# Patient Record
Sex: Male | Born: 1935 | Race: White | Hispanic: No | Marital: Married | Smoking: Former smoker
Health system: Southern US, Community
[De-identification: ages and names within clinical notes are randomized; demographics above are authoritative.]

## PROBLEM LIST (undated history)

## (undated) DIAGNOSIS — K469 Unspecified abdominal hernia without obstruction or gangrene: Secondary | ICD-10-CM

## (undated) DIAGNOSIS — N400 Enlarged prostate without lower urinary tract symptoms: Secondary | ICD-10-CM

## (undated) DIAGNOSIS — R600 Localized edema: Secondary | ICD-10-CM

## (undated) DIAGNOSIS — S72009A Fracture of unspecified part of neck of unspecified femur, initial encounter for closed fracture: Secondary | ICD-10-CM

## (undated) DIAGNOSIS — E7800 Pure hypercholesterolemia, unspecified: Secondary | ICD-10-CM

## (undated) DIAGNOSIS — C439 Malignant melanoma of skin, unspecified: Secondary | ICD-10-CM

## (undated) DIAGNOSIS — I10 Essential (primary) hypertension: Secondary | ICD-10-CM

## (undated) DIAGNOSIS — IMO0002 Reserved for concepts with insufficient information to code with codable children: Secondary | ICD-10-CM

## (undated) DIAGNOSIS — M109 Gout, unspecified: Secondary | ICD-10-CM

## (undated) DIAGNOSIS — D696 Thrombocytopenia, unspecified: Secondary | ICD-10-CM

## (undated) DIAGNOSIS — H409 Unspecified glaucoma: Secondary | ICD-10-CM

## (undated) DIAGNOSIS — I4891 Unspecified atrial fibrillation: Secondary | ICD-10-CM

## (undated) HISTORY — PX: APPENDECTOMY: SHX54

## (undated) HISTORY — DX: Gout, unspecified: M10.9

## (undated) HISTORY — PX: TONSILLECTOMY: SUR1361

## (undated) HISTORY — PX: EYE SURGERY: SHX253

## (undated) HISTORY — DX: Thrombocytopenia, unspecified: D69.6

## (undated) HISTORY — DX: Essential (primary) hypertension: I10

## (undated) HISTORY — PX: JOINT REPLACEMENT: SHX530

## (undated) HISTORY — DX: Localized edema: R60.0

## (undated) HISTORY — DX: Benign prostatic hyperplasia without lower urinary tract symptoms: N40.0

## (undated) HISTORY — DX: Pure hypercholesterolemia, unspecified: E78.00

## (undated) HISTORY — PX: HERNIA REPAIR: SHX51

---

## 2004-06-28 ENCOUNTER — Encounter: Admission: RE | Admit: 2004-06-28 | Discharge: 2004-06-28 | Payer: Self-pay | Admitting: Orthopedic Surgery

## 2004-07-03 HISTORY — PX: LUMBAR DISC SURGERY: SHX700

## 2004-07-21 ENCOUNTER — Inpatient Hospital Stay (HOSPITAL_COMMUNITY): Admission: RE | Admit: 2004-07-21 | Discharge: 2004-07-22 | Payer: Self-pay | Admitting: Neurosurgery

## 2006-11-01 HISTORY — PX: REPLACEMENT TOTAL KNEE: SUR1224

## 2006-11-05 ENCOUNTER — Inpatient Hospital Stay (HOSPITAL_COMMUNITY): Admission: RE | Admit: 2006-11-05 | Discharge: 2006-11-10 | Payer: Self-pay | Admitting: Orthopedic Surgery

## 2007-04-03 ENCOUNTER — Ambulatory Visit (HOSPITAL_COMMUNITY): Admission: RE | Admit: 2007-04-03 | Discharge: 2007-04-03 | Payer: Self-pay | Admitting: General Surgery

## 2008-04-02 HISTORY — PX: CATARACT EXTRACTION: SUR2

## 2010-11-15 NOTE — Consult Note (Signed)
Isaac Villa, PINSKY NO.:  [id number]   MEDICAL RECORD NO.:  [id number]          PATIENT TYPE:  INP   LOCATION:  1621                         FACILITY:  [facility]   PHYSICIAN:  Lyn Records, M.D.   DATE OF BIRTH:  [birth year]   DATE OF CONSULTATION:  11/07/2006  DATE OF DISCHARGE:                                 CONSULTATION   REASON FOR CONSULTATION:  Unresponsiveness, history of atrial  fibrillation.   CONCLUSIONS:  1. Loss of consciousness witnessed by wife occurring on two occasions      this afternoon associated with twitching and rigor. Rule out      transient severe bradycardia resulting in Stokes-Adams syncopal      attack.  Rule out seizure.  Rule out other.  2. History of chronic atrial fibrillation with current moderate rate      control.  3. Status post left knee replacement 11/05/2006.  4. Chronic anticoagulation therapy.  5. History of melanoma.  6. Hypertension.  7. Benign prostatic hypertrophy with urinary retention.   RECOMMENDATIONS:  1. Transfer to monitored bed  2. Check digoxin level.  3. Continue current medical regimen as listed.  4. Continue cardiac rehab.  5. Serial cardiac enzymes, rule out myocardial infarction.  6. EKG in a.m.  7. Neurology consultation.   COMMENTS:  The patient is 61.  He is confused and we are consulted by  Dr. Lequita Halt because of two the distinct episodes today of diminished  consciousness/syncope associated with twitching/jerking.  Both episodes  were witnessed by the patient's wife.  He was not arousable to verbal or  physical stimuli.  The episodes were short lived.  They were not  witnessed by a nurse.  On each occasion by the time the nurse got there  the episode was over and the patient had regained consciousness.  There  was no antecedent or post event chest discomfort, palpitations, dyspnea,  headache or other complaints.   CURRENT MEDICAL REGIMEN:  Allopurinol, benazepril, digoxin, diltiazem,  Colace, Proscar, hydrochlorothiazide, KCl,  Zocor, Flomax and warfarin.   FAMILY HISTORY:  Noncontributory.   SOCIAL HISTORY:  Noncontributory.   PHYSICAL EXAMINATION:  GENERAL:  On exam the patient is in no acute  distress.  VITAL SIGNS:  Blood pressure 134/74, heart rate is 98, O2 saturation is  95% on 2 liters.  NECK:  No carotid bruits heard.  Neck veins are flat.  HEART:  The cardiac exam reveals a 1/6 systolic murmur and a diastolic  murmur.  Rhythm is irregularly irregular.  ABDOMEN:  Soft.  EXTREMITIES: Reveal no edema.  NEURO: Exam is unremarkable.   Hemoglobin is 8.5.  BUN and creatinine are 14 and 0.91 potassium is 3.5.  INR is 1.8.  CK-MB 2.4, troponin-I 0.03.  Chest x-ray:  On 05/07 no acute abnormality.  EKG reveals chronic atrial  fibrillation with interventricular conduction delay that is nonspecific  and left axis deviation.  This finding is unchanged from prior EKG in  Dr. Norris Cross office on 10/17/2006.   DISCUSSION:  I am not sure what happened to the patient.  He could have  had transient hypoxemia secondary to sedation from medications.  He  could have had a transient arrhythmia.  He could have had a seizure.  We  need to move him to a telemetry bed.  We need to consider neurology  consultation.  Dr. Mayford Knife will follow.      Lyn Records, M.D.  Electronically Signed     HWS/MEDQ  D:  11/07/2006  T:  11/08/2006  Job:  [id number]   cc:   Armanda Magic, M.D.  Fax: [phone]   Jethro Bastos, M.D.  Fax: [phone]   Ollen Gross, M.D.  Fax: [phone]

## 2010-11-15 NOTE — H&P (Signed)
NAMESTEPHANE, Isaac Villa              ACCOUNT NO.:  [id number]   MEDICAL RECORD NO.:  [id number]          PATIENT TYPE:  INP   LOCATION:  1621                         FACILITY:  [facility]   PHYSICIAN:  Ollen Gross, M.D.    DATE OF BIRTH:  [birth year]   DATE OF ADMISSION:  11/05/2006  DATE OF DISCHARGE:                              HISTORY & PHYSICAL   DATE OF OFFICE VISIT HISTORY AND PHYSICAL:  October 25, 2006   CHIEF COMPLAINT:  Left knee pain.   HISTORY OF PRESENT ILLNESS:  The patient is a 77 year old male who has  been seen by Dr. Lequita Halt in second opinion for ongoing knee pain.  He  has bilateral knee pain and the left knee seems to be more problematic  and symptomatic than the right.  It has been ongoing for quite some time  now.  He has 2 homes, 1 here in [place] and also resides in [place]  for part of the year.  He was seen by his orthopedic surgeon down in  [place] and is known to have end-stage arthritis in the knees.  He  underwent recently an aspiration and injection; unfortunately, the knee  has continued to get worse.  He was seen by Dr. Lequita Halt and found to  have significant end-stage arthritis of the left knee with  patellofemoral bone-on-bone.  He has had apparently recurring bloody  effusions; the most recent aspiration in the office did show about 80 mL  of old blood consistent with hemarthrosis.  Due to recurring effusions  and also significant arthritis, it was felt he would benefit undergoing  knee replacement.  Risks and benefits have been discussed.  He now  elects to proceed with surgery.   ALLERGIES:  PENICILLIN, LOVENOX and COLCHICINE.   CURRENT MEDICATIONS:  Coumadin, allopurinol, potassium, Lipitor,  hydrochlorothiazide, Lotensin/benazepril, Cardizem/diltiazem, Proscar,  Flomax.   PAST MEDICAL HISTORY:  1. Chronic atrial fibrillation.  2. Hypertension.  3. History of BPH.  4. Gout.  5. Dyslipidemia.  6. History of melanoma.   PAST SURGICAL  HISTORY:  1. Appendectomy.  2. Hernia repair x3.  3. Herniated disk surgery.   FAMILY HISTORY:  Mother deceased at age 20 with a history of aneurysm.  Father deceased at age 79 with a history of MI.  He had an uncle with a  stroke.  Stroke and aneurysms run in the family.   SOCIAL HISTORY:  Married and has 4 children.  Cigar smoker.  Social  intake of alcohol.   REVIEW OF SYSTEMS:  GENERAL:  No fevers, chills or night sweats.  NEUROLOGIC:  No seizures, syncope or paralysis.  RESPIRATORY:  No  shortness breath, productive cough or hemoptysis.  CARDIOVASCULAR:  No  chest pain, angina or orthopnea.  GI:  No nausea, vomiting, diarrhea or  constipation.  GU:  No dysuria, hematuria or discharge.  MUSCULOSKELETAL:  Left knee.   PHYSICAL EXAMINATION:  VITAL SIGNS:  Pulse 56, respirations 12, blood  pressure 112/58.  GENERAL:  A 77 year old male, well-nourished, well-developed, tall  frame, in no acute distress.  He is alert, oriented and cooperative,  somewhat  mildly anxious at time of exam.  He is accompanied by his wife.  HEENT:  Normocephalic, atraumatic.  Pupils are equal, round and  reactive.  Oropharynx clear.  EOMs intact.  NECK:  Supple.  CHEST:  Clear.  HEART:  He does have an irregular rhythm.  S1 and S2 are noted.  No  rubs, thrills or palpitations.  ABDOMEN:  Soft and nontender.  Bowel sounds are present.  RECTAL, BREASTS AND GENITALIA:  Not done and not pertinent to present  illness.  EXTREMITIES:  Left knee:  He does have a moderate effusion.  Range of  motion 10-90.  No instability.  Tender more lateral than medial.   IMPRESSION:  1. Osteoarthritis, end stage, left knee.  2. Degenerative arthritis, right knee.  3. History of melanoma.  4. Hypertension.  5. History of gout.  6. Dyslipidemia.  7. Benign prostatic hypertrophy.  8. Chronic atrial fibrillation.   PLAN:  The patient will be admitted to [facility] to undergo a  left total knee replacement  arthroplasty.  Surgery will be performed by  Dr. Ollen Gross.  His cardiologist, Dr. Armanda Magic, will be notified  of the room number on admission and will be consulted if needed for any  assistance during the hospital course.      Alexzandrew L. Perkins, P.A.C.      Ollen Gross, M.D.  Electronically Signed    ALP/MEDQ  D:  11/06/2006  T:  11/06/2006  Job:  [id number]   cc:   Armanda Magic, M.D.  Fax: [phone]   Jethro Bastos, M.D.  Fax: [phone]   Maretta Bees. Vonita Moss, M.D.  Fax: [phone]

## 2010-11-15 NOTE — Op Note (Signed)
NAMEANASTASIO, Villa              ACCOUNT NO.:  [id number]   MEDICAL RECORD NO.:  [id number]          PATIENT TYPE:  INP   LOCATION:  0010                         FACILITY:  [facility]   PHYSICIAN:  Ollen Gross, M.D.    DATE OF BIRTH:  1925-06-06   DATE OF PROCEDURE:  11/05/2006  DATE OF DISCHARGE:                               OPERATIVE REPORT   PREOPERATIVE DIAGNOSIS:  Osteoarthritis, left knee.   POSTOPERATIVE DIAGNOSIS:  Osteoarthritis, left knee.   PROCEDURE:  Left total knee arthroplasty.   SURGEON:  Ollen Gross, M.D.   ASSISTANT:  Avel Peace PA-C.   ANESTHESIA:  Spinal.   ESTIMATED BLOOD LOSS:  Minimal.   DRAINS:  None.   TOURNIQUET TIME:  44 minutes at 300 mmHg.   COMPLICATIONS:  None.  Stable to recovery.   BRIEF CLINICAL NOTE:  [name] is a 77 year old male who has severe  endstage arthritis of the left knee and has been getting recurrent  hemarthroses.  He has had injections and extensive nonoperative  management, but has had progressively worsening pain and dysfunction.  He presents for total knee arthroplasty.   PROCEDURE IN DETAIL:  After successful administration of spinal  anesthetic, a tourniquet is placed high on the left thigh and left lower  extremity prepped and draped in the usual sterile fashion.  Extremity  was wrapped in Esmarch, knee flexed, tourniquet inflated to 300 mmHg.  Midline incision made with a 10 blade through subcutaneous tissue to the  level of the extensor mechanism.  Fresh blade is used make a medial  parapatellar arthrotomy.  Soft tissue over the proximal medial tibia is  subperiosteally elevated to the joint line with the knife into the  semimembranosus bursa with a Cobb elevator.  Soft tissue laterally is  elevated with attention being paid to avoid patellar tendon on tibial  tubercle.  Patella was subluxed laterally and knee flexed 90 degrees,  ACL and PCL removed.  He had a lot of hemosiderin stained synovium and a  large hematoma in the joint, all of which is removed.  A drill was used  create a starting hole in distal femur and canal is thoroughly  irrigated.  A 5 degree left valgus alignment guide is placed and  referencing off the posterior condyles rotations marked on the block  pinned to remove 10 mm off the distal femur.  Distal femoral resection  is made with an oscillating saw.  Sizing blocks placed, size 5 is most  appropriate.  Rotation is marked off the epicondylar axis.  Size 5  cutting block is placed and the anterior-posterior chamfer cuts made.   Tibia subluxed forward and the menisci removed.  Extramedullary tibial  alignment guide is placed referencing proximally at the medial aspect of  the tibial tubercle and distally along the second metatarsal axis and  tibial crest.  Blocks pinned to remove 10 mm off the non deficient  lateral side.  Tibial resection is made with an oscillating saw.  Size 5  is the most appropriate tibial component and the proximal tibia is  prepared with the modular drill and  keel punch for a size 5.  Femoral  preparation is completed with the intercondylar cut.   Size 5 mobile bearing tibial trial, size 5 posterior stabilized femoral  trial and 10 mm posterior stabilized rotating platform insert trial are  placed with the 10 full extension is achieved with excellent varus and  valgus balance throughout full range of motion.  Patella is everted,  thickness measured to 26 mm.  Freehand resection is taken to 14 mm, 38  template is placed, lug holes were drilled, trial patella is placed and  it tracks normally.  Osteophytes removed off the posterior femur with  the trial in place.  All trials removed and the cut bone surfaces are  prepared with pulsatile lavage.  Cement is mixed and once ready for  implantation a size five 5 bearing tibial tray, size 5 posterior  stabilized femur and 38 patella are cemented in place and the patella is  held with the clamp.   Trial 10-mm insert is placed, knee held in full  extension and all extruded cement removed.  Once the cement is fully  hardened, then the permanent 10 mm posterior stabilized rotating  platform insert is placed into the tibial tray.  The wound is copiously  irrigated with saline solution.  FloSeal is placed on the posterior  capsule flap the suprapatellar area and medial and lateral gutters.  Tourniquet is released with total time of 44 minutes.  Moist sponge is  held and after 2-3 minutes  the joint is inspected, there is minimal  bleeding.  The joint is then again irrigated and the extensor mechanism  closed with interrupted #1 PDS.  Flexion against gravity is 135 degrees.  Subcu is closed with interrupted 2-0 Vicryl and subcuticular running 4-0  Monocryl.  Incision is cleaned and dried and Steri-Strips and a bulky  sterile dressing applied.  He is placed into a knee immobilizer,  awakened and transferred to recovery in stable condition.      Ollen Gross, M.D.  Electronically Signed     FA/MEDQ  D:  11/05/2006  T:  11/06/2006  Job:  [id number]

## 2010-11-15 NOTE — Op Note (Signed)
NAMEJUNAH, Isaac Villa              ACCOUNT NO.:  [id number]   MEDICAL RECORD NO.:  [id number]          PATIENT TYPE:  AMB   LOCATION:  SDS                          FACILITY:  [facility]   PHYSICIAN:  Adolph Pollack, M.D.DATE OF BIRTH:  Jul 02, 1925   DATE OF PROCEDURE:  04/03/2007  DATE OF DISCHARGE:                               OPERATIVE REPORT   PREOPERATIVE DIAGNOSIS:  Left inguinal hernia.   POSTOPERATIVE DIAGNOSIS:  Direct left inguinal hernia.   PROCEDURE:  Left inguinal repair with mesh.   SURGEON:  Adolph Pollack, M.D.   ANESTHESIA:  General plus Marcaine for local.   INDICATIONS:  This 77 year old male had a recent knee replacement.  He  had rehabilitation after that and noticed a bulge in the left groin  which was bothersome to him.  He has a left inguinal hernia that is  moderate to large sized and now presents for repair.  We have discussed  the procedure, risks and aftercare preoperatively.   TECHNIQUE:  He was seen in the holding area and the left groin marked  with my initials.  He was then brought to the operating room, placed  supine on the operating table and general anesthetic was administered.  The hair in the left groin area was clipped, and the area was sterilely  prepped and draped.  Marcaine solution was infiltrated superficially and  deep into the left groin.  Left groin incision was made through the  skin, subcutaneous tissue and Scarpa's fascia until the external oblique  aponeurosis was exposed.  Local anesthetic was then infiltrated deep to  the external oblique aponeurosis.  An incision was made in the external  oblique aponeurosis through the external ring medially on toward the  anterior superior iliac spine laterally.  Using blunt dissection, the  shelving edge of the inguinal ligament was exposed inferiorly and the  internal oblique aponeurosis exposed superiorly.  The ilioinguinal nerve  was identified and was retracted inferior to the plane  of dissection.   The spermatic cord was isolated.  An indirect hernia sac and contents  were adherent to the spermatic cord, and these were dissected free and  reduced.  I then closed the attenuated transversalis fascia with  interrupted 2-0 Vicryl sutures.   Spermatic cord was examined, and no indirect sac was noted.   A piece of 3 x 6-inch polypropylene mesh was then brought onto the field  and anchored 2-cm medial to the pubic tubercle with 2-0 Prolene suture.  The inferior aspect of the mesh was then anchored to the shelving edge  of the inguinal ligament with a running 2-0 Prolene suture to a level 1-  to 2-cm lateral to the internal ring.  A slit was cut in the mesh, and  the two tails were wrapped around spermatic cord.  The superior aspect  of mesh was anchored to the internal oblique muscle and aponeurosis with  interrupted 2-0 Vicryl sutures.  The two tails were then crossed,  creating a new internal ring, and these were anchored to the shelving  edge of the inguinal ligament with a 2-0 Prolene  suture.  The tip of the  hemostat could be placed through a new aperture.   At this point, hemostasis was adequate.  The lateral aspect of mesh was  then tucked deep to the external oblique aponeurosis which was closed  with a running 3-0 Vicryl suture.  The Scarpa's fascia was closed with 2-  0 Vicryl  running suture.  The skin was closed with a 4-0 Monocryl subcuticular  stitch, followed by Steri-Strips and sterile dressings.  Needle, sponge,  and instrument counts were reported to be correct.   He tolerated the procedure well without apparent complications and was  taken to recovery in satisfactory condition.      Adolph Pollack, M.D.  Electronically Signed     TJR/MEDQ  D:  04/03/2007  T:  04/03/2007  Job:  [id number]   cc:   Artist Pais Mina Marble, M.D.  Jethro Bastos, M.D.  Armanda Magic, M.D.

## 2010-11-18 NOTE — Op Note (Signed)
Isaac Villa, Isaac Villa              ACCOUNT NO.:  [id number]   MEDICAL RECORD NO.:  [id number]          PATIENT TYPE:  INP   LOCATION:  2899                         FACILITY:  [facility]   PHYSICIAN:  Reinaldo Meeker, M.D. DATE OF BIRTH:  Mar 03, 1925   DATE OF PROCEDURE:  07/21/2004  DATE OF DISCHARGE:                                 OPERATIVE REPORT   PREOPERATIVE DIAGNOSIS:  Herniated disk, L4-L5, right with superior  fragment.   POSTOPERATIVE DIAGNOSIS:  Herniated disk, L4-L5, right with superior  fragment.   PROCEDURE:  Right L4-L5 intralaminar laminotomy for excision of herniated  disk and decompression of right L4 and L5 nerve roots.   SURGEON:  Reinaldo Meeker, M.D.   ASSISTANT:  Marikay Alar, M.D.   PROCEDURE IN DETAIL:  After being placed in the prone position, the  patient's back was prepped and draped in the usual sterile fashion.  A  localizing x-ray was taken prior to the incision to identify the appropriate  level.  A midline incision was made above the spinous processes of L4 and  L5.  Using the Bovie cutting current, the incision was carried down to the  spinous processes.  Subperiosteal dissection was then carried out along the  right side of the spinous processes and laminae and a self-retaining  retractor was placed for exposure.  A second x-ray showed approach at the  appropriate level.  Using the high speed drill, the inferior three quarters  of the L4 lamina and medial one-third of the facet joint were removed.  The  drill was then used to the superior edge of the L5 lamina.  Dissection was  carried up superiorly with bony removal until the L4 and L5 nerve roots were  both identified.  The L4-L5 disk was then identified and found to be  herniated.  After coagulating on the annulus, the annulus was incised with a  15 blade.  Using pituitary rongeurs and curettes, the disk space was cleaned  out and at the same time great care was taken to avoid injury to neural  elements which was successfully done.  Inspection was then turned superiorly  to decompress the L4 nerve root.  Numerous fragments of disk material were  found to be wedged beneath the L4 nerve root as it went around the pedicle  at L4, these were decompressed and removed which gave excellent  decompression to the L4 nerve root.  One final large piece was found beneath  the nerve root as it entered the foramen.  At this point, inspection was  carried out in all directions for any evidence of residual compression and  none could be identified.  The L4 and L5 nerve roots were both found to be  free.  The wound was then irrigated copiously with antibiotic irrigation.  Any bleeding was controlled with bipolar coagulation and Gelfoam.  The wound  was then closed using interrupted Vicryl on the muscle fascia and the  subcutaneous tissues and staples on the skin.  A sterile dressing was then  applied, the patient was extubated and taken to the recovery room in  stable  condition.       ROK/MEDQ  D:  07/21/2004  T:  07/21/2004  Job:  [id number]

## 2010-11-18 NOTE — Discharge Summary (Signed)
Isaac Villa, Isaac Villa              ACCOUNT NO.:  [id number]   MEDICAL RECORD NO.:  [id number]          PATIENT TYPE:  INP   LOCATION:  6705                         FACILITY:  [facility]   PHYSICIAN:  Alexzandrew L. Perkins, P.A.C.DATE OF BIRTH:  [birth year]   DATE OF ADMISSION:  11/08/2006  DATE OF DISCHARGE:  11/10/2006                               DISCHARGE SUMMARY   ADMITTING DIAGNOSES:  1. Osteoarthritis, endstage, left knee.  2. Degenerative arthritis, right knee.  3. History of melanoma.  4. Hypertension.  5. History of gout.  6. Dyslipidemia.  7. Benign prostatic hypertrophy.  8. Chronic atrial fibrillation.   DISCHARGE DIAGNOSES:  1. Osteoarthritis, left knee, status post left total knee      arthroplasty.  2. Postoperative blood loss anemia.  3. Status post transfusion x2.  4. Postoperative hypokalemia, improved.  5. Postoperative transient altered mental status, improved.  6. Degenerative arthritis, right knee.  7. History of melanoma.  8. Hypertension.  9. History of gout.  10.Dyslipidemia.  11.Benign prostatic hypertrophy.  12.Chronic atrial fibrillation.   PROCEDURES:  Nov 05, 2006, left total knee surgery by Dr. Sherlean Foot, assisted  by Patrica Duel, PAC.   ANESTHESIA:  Spinal.   CONSULTS:  Cardiology, Dr. Verdis Prime, covering for Dr. Mayford Knife.  Urology, Dr. McDiarmid, covering for Dr. Vonita Moss.   BRIEF HISTORY:  The patient is a 77 year old male who has known  arthritis in both knees.  Left knee is more symptomatic, problematic.  He has been treated conservatively in the past, including injections.  Reached a point where he would benefit from surgical intervention.  Risks and benefits were discussed, and the patient has elected to  proceed with surgery.   LABORATORY DATA:  Preop CBC:  Hemoglobin 13.0, hematocrit 39.5,  postoperative hemoglobin 10 drifted down to 8.5.  Given 2 units of  blood, came back up to 9.1, drifted back down further to 8.4.  Given  2  further units, hemoglobin back up to 10.0 and 30.1.  PT/PTT on admission  14.8 and 28 respectively.  INR 1.1, serial ProTime was followed.  Last  noted PT/INR 23.1/2.0.  Chem panel on admission:  Sodium slightly  elevated at 146.  Remaining chem panel all within normal limits with  essentially low albumin at 3.2.  Serial BMETs were followed, and sodium  did drop from 146 to 131, came back up to 137.  Potassium dropped at 5.1  down to 3.3, back to 4.1.  Cardiac enzymes taken.  First set on Nov 07, 2006:  CK 137, CK-MB 2.4, relative index 1.8, troponin 0.03.  Second set  at CK 153, CK-MB 2.7, relative index 1.8, troponins 0.02.  TSH level  taken, 0.956.  Preop UA:  Small leukocyte esterase, 3-6 white cells,  rare bacteria, __________ was negative.  Blood type:  AB positive.   EKG, October 2008, faxed copy is unconfirmed.   HOSPITAL COURSE:  The patient was admitted to [facility],  taken to the operating room, underwent the above-stated procedure  without complication.  The patient tolerated the procedure well and  transferred to the recovery room,  was started on PCA and p.o. analgesic  for pain control.  Following surgery did pretty well on the evening of  surgery and on the morning of day 1.  We briefly discussed surgical  findings, tried to get him out of bed.  Hemoglobin looked good at 10.7.  He had a history of atrial fibrillation.  He was rate controlled.  He  was on his Lanoxin.  His blood pressure looked good.  Urinary output is  a little low, but he was diuresing his fluids we switched over, took the  D5 out because he had some elevated serum glucose, started getting up  out of bed on day 1.  By the evening of day 1 and morning of day 2, he  had developed some confusion through the night, notes some questionable  sundowning, also with multifactorial narcotics and anesthesia.  We  discontinued the heavy narcotics and reduced the dose.  Unfortunately,  he also pulled  out his Foley in a traumatic removal and had some  bleeding.  We tried a voiding trial, decided we would get urology if he  was unable to void.  Later that day, the patient had some transient  altered mental status with some pauses, when his wife states he was  actually unresponsive for a few seconds.  With his cardiac arrhythmia  history, cardiology was called.  Cardiac panels were ordered.  He had 2  sets of cardiac enzymes which were normal.  The patient was seen by  cardiology, Dr. Katrinka Blazing, covering for Dr. Mayford Knife.  Labs were ordered as  above.  Check thyroid level, also echo to make sure that he was not in  some type of bradycardic rhythm.  His pulse was doing pretty well.  He  did require a urology consult.  Dr. McDiarmid came in and reinserted the  Foley catheter, decided we will leave it in for 4 days.  They  recommended telemetry bed.  There were no telemetry beds available at  [facility] at the time, therefore he was transferred over to [facility]  on the evening of day 2.  By the morning of day 3, he was doing a little  bit better.  He is being monitored by cardiology who felt this episode  yesterday was probably multifactorial, combination of his anemia,  pressure, narcotics, and possibly had some vagal episodes, urinary  retention.  Also, he did have some clots from the traumatic removal of  the catheter.  Is of note also on day 2, that his hemoglobin was low  down to 8.5.  He was given 2 units of blood due to all the issues with  his altered mental status, his AFib, and anemia.  The hemoglobin came  back up to 9.1 by day 3, and this was after he was transferred over to  [facility].  He was doing a little bit better.  The mental status changes had  resolved.  We felt it was more multifactorial with vasovagal urinary  symptoms and/or the anemia and arrhythmia.  He was rate controlled, and  on day 3, his pulse was about 84.  Dressing was changed on day 2 and day  3.  Incision looked  well.  We rechecked his electrolytes.  Potassium had  drifted a little bit down at 3.5.  Once he was doing better, we resumed  his therapy, which was on hold due to the mental status changes.  By day  4, he was doing better.  Wound was  healing well, although his hemoglobin  drifted down again to 8.4.  His potassium was noted to be down, low at  3.3.  Gave him potassium supplements and rechecked.  He responded well.  He was given 2 more units of blood, and his hemoglobin improved back up  to a level of 10.0.  Once his blood pressure stabilized, we resumed his  blood pressure meds but held them  for Lopressors.  We have kept his  Foley in for several more days to allow for the traumatic removal and to  calm down.  He continues to improve from a orthopedic standpoint.  They  did do a 2D echo on Nov 09, 2006.  Echo noted to be in the electronic  chart.  From a therapy standpoint, once the transient episode resolved  and the therapy was resumed, he was doing pretty well.  He was up  ambulating about 55 feet on day 3 and then about 83 feet by day 4.  He  continued to progress well.  Once he had been cleared by cardiology, he  was reevaluated on day 5 by recovery services on Nov 10, 2006.  He was  progressing well with physical therapy.  He may be weaned over to low  dose of narcotics.  He was tolerating his meds, and he was discharged  home later that day.   DISCHARGE PLAN:  1. The patient was discharged home on Nov 10, 2006.  2. For discharge diagnoses, please above.   DISCHARGE MEDICATIONS:  1. He is on Coumadin for DVT prophylaxis.  2. Vicodin 5 mg p.r.n. pain.  3. Robaxin p.r.n. spasm.  4. He is to resume his home meds.   DIET:  Cardiac diet.   ACTIVITY:  He is weightbearing as tolerated to left lower extremity.  Total knee protocol.  Home health PT, home health nursing, range of  motion and strengthening exercises.   FOLLOWUP:  Two weeks with Dr. Sherlean Foot in the office.  Contact the  office  for an appointment.  He will need to follow up with Dr. Vonita Moss on an  outpatient basis; contact his office for followup care.  Follow up with  Dr. Armanda Magic on an outpatient basis; contact her office for followup  care.   DISPOSITION:  Home.   CONDITION ON DISCHARGE:  Improving.      Alexzandrew L. Perkins, P.A.C.     ALP/MEDQ  D:  01/08/2007  T:  01/08/2007  Job:  [id number]   cc:   Mila Homer. Sherlean Foot, M.D.  Lyn Records, M.D.  Leighton Roach McDiarmid, M.D.  Armanda Magic, M.D.  Jethro Bastos, M.D.  Maretta Bees. Vonita Moss, M.D.

## 2011-04-13 LAB — DIFFERENTIAL
Basophils Absolute: 0
Basophils Relative: 0
Eosinophils Absolute: 0.1
Eosinophils Relative: 1
Lymphocytes Relative: 17
Lymphs Abs: 1.2
Monocytes Absolute: 0.7
Monocytes Relative: 9
Neutro Abs: 5.3
Neutrophils Relative %: 73

## 2011-04-13 LAB — COMPREHENSIVE METABOLIC PANEL
ALT: 9
AST: 19
Albumin: 3.7
Alkaline Phosphatase: 76
BUN: 19
CO2: 30
Calcium: 9.4
Chloride: 110
Creatinine, Ser: 1.07
GFR calc Af Amer: >60
GFR calc non Af Amer: >60
Glucose, Bld: 105 — ABNORMAL HIGH
Potassium: 4.4
Sodium: 144
Total Bilirubin: 0.9
Total Protein: 6.5

## 2011-04-13 LAB — CBC
HCT: 46.3
Hemoglobin: 15.4
MCHC: 33.3
MCV: 89.6
Platelets: 158
RBC: 5.18
RDW: 14.1 — ABNORMAL HIGH
WBC: 7.4

## 2011-04-13 LAB — PROTIME-INR
INR: 1.5
Prothrombin Time: 18.4 — ABNORMAL HIGH

## 2011-07-05 DIAGNOSIS — Z7901 Long term (current) use of anticoagulants: Secondary | ICD-10-CM | POA: Diagnosis not present

## 2011-07-11 DIAGNOSIS — B351 Tinea unguium: Secondary | ICD-10-CM | POA: Diagnosis not present

## 2011-07-11 DIAGNOSIS — M79609 Pain in unspecified limb: Secondary | ICD-10-CM | POA: Diagnosis not present

## 2011-07-24 DIAGNOSIS — H409 Unspecified glaucoma: Secondary | ICD-10-CM | POA: Diagnosis not present

## 2011-07-24 DIAGNOSIS — H4011X0 Primary open-angle glaucoma, stage unspecified: Secondary | ICD-10-CM | POA: Diagnosis not present

## 2011-07-31 DIAGNOSIS — Z7901 Long term (current) use of anticoagulants: Secondary | ICD-10-CM | POA: Diagnosis not present

## 2011-08-14 DIAGNOSIS — Z7901 Long term (current) use of anticoagulants: Secondary | ICD-10-CM | POA: Diagnosis not present

## 2011-08-28 DIAGNOSIS — Z7901 Long term (current) use of anticoagulants: Secondary | ICD-10-CM | POA: Diagnosis not present

## 2011-09-16 DIAGNOSIS — M109 Gout, unspecified: Secondary | ICD-10-CM | POA: Diagnosis not present

## 2011-09-25 DIAGNOSIS — Z7901 Long term (current) use of anticoagulants: Secondary | ICD-10-CM | POA: Diagnosis not present

## 2011-09-28 DIAGNOSIS — B351 Tinea unguium: Secondary | ICD-10-CM | POA: Diagnosis not present

## 2011-09-28 DIAGNOSIS — M79609 Pain in unspecified limb: Secondary | ICD-10-CM | POA: Diagnosis not present

## 2011-10-26 DIAGNOSIS — Z7901 Long term (current) use of anticoagulants: Secondary | ICD-10-CM | POA: Diagnosis not present

## 2011-10-26 DIAGNOSIS — H4011X0 Primary open-angle glaucoma, stage unspecified: Secondary | ICD-10-CM | POA: Diagnosis not present

## 2011-10-26 DIAGNOSIS — H409 Unspecified glaucoma: Secondary | ICD-10-CM | POA: Diagnosis not present

## 2011-10-30 DIAGNOSIS — Z85828 Personal history of other malignant neoplasm of skin: Secondary | ICD-10-CM | POA: Diagnosis not present

## 2011-10-30 DIAGNOSIS — L570 Actinic keratosis: Secondary | ICD-10-CM | POA: Diagnosis not present

## 2011-10-30 DIAGNOSIS — Z85820 Personal history of malignant melanoma of skin: Secondary | ICD-10-CM | POA: Diagnosis not present

## 2011-11-28 DIAGNOSIS — I10 Essential (primary) hypertension: Secondary | ICD-10-CM | POA: Diagnosis not present

## 2011-11-28 DIAGNOSIS — M109 Gout, unspecified: Secondary | ICD-10-CM | POA: Diagnosis not present

## 2011-11-28 DIAGNOSIS — Z0000 Encounter for general adult medical examination without abnormal findings: Secondary | ICD-10-CM | POA: Diagnosis not present

## 2011-11-28 DIAGNOSIS — E7800 Pure hypercholesterolemia, unspecified: Secondary | ICD-10-CM | POA: Diagnosis not present

## 2011-11-28 DIAGNOSIS — Z7901 Long term (current) use of anticoagulants: Secondary | ICD-10-CM | POA: Diagnosis not present

## 2011-11-28 DIAGNOSIS — I4891 Unspecified atrial fibrillation: Secondary | ICD-10-CM | POA: Diagnosis not present

## 2011-11-30 DIAGNOSIS — N401 Enlarged prostate with lower urinary tract symptoms: Secondary | ICD-10-CM | POA: Diagnosis not present

## 2011-11-30 DIAGNOSIS — R972 Elevated prostate specific antigen [PSA]: Secondary | ICD-10-CM | POA: Diagnosis not present

## 2011-12-14 DIAGNOSIS — M79609 Pain in unspecified limb: Secondary | ICD-10-CM | POA: Diagnosis not present

## 2011-12-14 DIAGNOSIS — B351 Tinea unguium: Secondary | ICD-10-CM | POA: Diagnosis not present

## 2011-12-28 DIAGNOSIS — Z7901 Long term (current) use of anticoagulants: Secondary | ICD-10-CM | POA: Diagnosis not present

## 2011-12-29 DIAGNOSIS — M1710 Unilateral primary osteoarthritis, unspecified knee: Secondary | ICD-10-CM | POA: Diagnosis not present

## 2012-01-06 ENCOUNTER — Emergency Department (HOSPITAL_COMMUNITY)
Admission: EM | Admit: 2012-01-06 | Discharge: 2012-01-06 | Disposition: A | Discharge status: Home / Self Care | Payer: Medicare Other | Attending: Emergency Medicine | Admitting: Emergency Medicine

## 2012-01-06 ENCOUNTER — Encounter (HOSPITAL_COMMUNITY): Payer: Self-pay | Admitting: Family Medicine

## 2012-01-06 DIAGNOSIS — I4891 Unspecified atrial fibrillation: Secondary | ICD-10-CM | POA: Diagnosis not present

## 2012-01-06 DIAGNOSIS — Z96659 Presence of unspecified artificial knee joint: Secondary | ICD-10-CM | POA: Insufficient documentation

## 2012-01-06 DIAGNOSIS — M1711 Unilateral primary osteoarthritis, right knee: Secondary | ICD-10-CM

## 2012-01-06 DIAGNOSIS — M1710 Unilateral primary osteoarthritis, unspecified knee: Secondary | ICD-10-CM | POA: Insufficient documentation

## 2012-01-06 DIAGNOSIS — Z880 Allergy status to penicillin: Secondary | ICD-10-CM | POA: Insufficient documentation

## 2012-01-06 HISTORY — DX: Reserved for concepts with insufficient information to code with codable children: IMO0002

## 2012-01-06 HISTORY — DX: Malignant melanoma of skin, unspecified: C43.9

## 2012-01-06 HISTORY — DX: Unspecified abdominal hernia without obstruction or gangrene: K46.9

## 2012-01-06 HISTORY — DX: Unspecified atrial fibrillation: I48.91

## 2012-01-06 HISTORY — DX: Unspecified glaucoma: H40.9

## 2012-01-06 MED ORDER — INDOMETHACIN ER 75 MG PO CPCR: 75.0000 mg | ORAL_CAPSULE | Freq: Two times a day (BID) | ORAL | Status: AC

## 2012-01-06 MED ORDER — HYDROCODONE-ACETAMINOPHEN 5-500 MG PO TABS: 1.0000 | ORAL_TABLET | Freq: Four times a day (QID) | ORAL | Status: AC | PRN

## 2012-01-06 NOTE — ED Provider Notes (Signed)
History     CSN: [id number]  Arrival date & time 01/06/12  1000   First MD Initiated Contact with Patient 01/06/12 1020      Chief Complaint  Patient presents with  . Knee Pain    (Consider location/radiation/quality/duration/timing/severity/associated sxs/prior treatment) HPI Pt presenting with right knee pain.  He states he was seen by orthopedics last week with pain and swelling in knee.  He had arthrocentesis of fluid as well as cortisone injection at that time.  He states there were xrays done in the office at that time.  There has been no new injury.  No fever/chills, no redness of the knee.  Swelling and pain continue.  States he took indomethacin x 1 dose last night which seemed to help with pain. Pain described as soreness.  Worse with movement and palpation.  There are no other associated systemic symptoms, there are no other alleviating or modifying factors.   Past Medical History  Diagnosis Date  . Atrial fibrillation   . Melanoma   . Herniated disc   . Hernia   . Glaucoma     Past Surgical History  Procedure Date  . Appendectomy   . Eye surgery     lens replacements  . Joint replacement     left knee  . Hernia repair     History reviewed. No pertinent family history.  History  Substance Use Topics  . Smoking status: Never Smoker   . Smokeless tobacco: Not on file  . Alcohol Use: Yes     3-4 glasses of wine per week      Review of Systems ROS reviewed and all otherwise negative except for mentioned in HPI  Allergies  Lovenox and Penicillins  Home Medications   Current Outpatient Rx  Name Route Sig Dispense Refill  . HYDROCODONE-ACETAMINOPHEN 5-500 MG PO TABS Oral Take 1-2 tablets by mouth every 6 (six) hours as needed for pain. 15 tablet 0  . INDOMETHACIN ER 75 MG PO CPCR Oral Take 1 capsule (75 mg total) by mouth 2 (two) times daily with a meal. 20 capsule 0    BP 140/75  Pulse 75  Temp 98 F (36.7 C) (Oral)  Resp 18  SpO2 97% Vitals  reviewed Physical Exam Physical Examination: General appearance - alert, well appearing, and in no distress Mental status - alert, oriented to person, place, and time Eyes - no conjunctival injection, no scleral icterus Chest - clear to auscultation, no wheezes, rales or rhonchi, symmetric air entry Heart - normal rate, regular rhythm, normal S1, S2, no murmurs, rubs, clicks or gallops Neurological - alert, oriented, normal speech, no focal findings or movement disorder noted Musculoskeletal -right knee with mild swelling compared to left (prior knee replacement on left), fluid not ballotable, no overlying erythema, some pain with ROM of right knee- otherwise no joint tenderness, deformity or swelling Extremities - peripheral pulses normal, no pedal edema, no clubbing or cyanosis Skin - normal coloration and turgor, no rashes  ED Course  Procedures (including critical care time)  Labs Reviewed - No data to display No results found.   1. Osteoarthritis of right knee       MDM  Pt with chronic right knee pain due to osteoarthritis.  Last week pt was seen by ortho- cortisone injection and joint aspiration. xrays performed in office.  Today no signs of septic joint.  Pt is continuing to c/o pain.  ACE wrap applied to assist with swelling, pain meds given.  Discharged  with strict return precautions.  He is agreeable with this plan.         Ethelda Chick, MD 01/07/12 [phone]

## 2012-01-06 NOTE — ED Notes (Signed)
Pt sees Dr. Despina Hick. States he was seen last week and received a steroid injection and fluid was drained off of right knee and had xrays done. States he is in a lot of pain.

## 2012-01-11 DIAGNOSIS — Z7901 Long term (current) use of anticoagulants: Secondary | ICD-10-CM | POA: Diagnosis not present

## 2012-01-22 DIAGNOSIS — Z7901 Long term (current) use of anticoagulants: Secondary | ICD-10-CM | POA: Diagnosis not present

## 2012-01-23 DIAGNOSIS — M1710 Unilateral primary osteoarthritis, unspecified knee: Secondary | ICD-10-CM | POA: Diagnosis not present

## 2012-01-25 DIAGNOSIS — H409 Unspecified glaucoma: Secondary | ICD-10-CM | POA: Diagnosis not present

## 2012-01-25 DIAGNOSIS — H4011X0 Primary open-angle glaucoma, stage unspecified: Secondary | ICD-10-CM | POA: Diagnosis not present

## 2012-02-05 DIAGNOSIS — M1710 Unilateral primary osteoarthritis, unspecified knee: Secondary | ICD-10-CM | POA: Diagnosis not present

## 2012-02-14 DIAGNOSIS — M1710 Unilateral primary osteoarthritis, unspecified knee: Secondary | ICD-10-CM | POA: Diagnosis not present

## 2012-02-14 DIAGNOSIS — IMO0002 Reserved for concepts with insufficient information to code with codable children: Secondary | ICD-10-CM | POA: Diagnosis not present

## 2012-02-20 DIAGNOSIS — M79609 Pain in unspecified limb: Secondary | ICD-10-CM | POA: Diagnosis not present

## 2012-02-20 DIAGNOSIS — B351 Tinea unguium: Secondary | ICD-10-CM | POA: Diagnosis not present

## 2012-02-26 DIAGNOSIS — Z7901 Long term (current) use of anticoagulants: Secondary | ICD-10-CM | POA: Diagnosis not present

## 2012-03-28 DIAGNOSIS — Z23 Encounter for immunization: Secondary | ICD-10-CM | POA: Diagnosis not present

## 2012-04-01 DIAGNOSIS — Z7901 Long term (current) use of anticoagulants: Secondary | ICD-10-CM | POA: Diagnosis not present

## 2012-04-02 DIAGNOSIS — M1710 Unilateral primary osteoarthritis, unspecified knee: Secondary | ICD-10-CM | POA: Diagnosis not present

## 2012-04-23 DIAGNOSIS — Z7901 Long term (current) use of anticoagulants: Secondary | ICD-10-CM | POA: Diagnosis not present

## 2012-04-29 DIAGNOSIS — Z961 Presence of intraocular lens: Secondary | ICD-10-CM | POA: Diagnosis not present

## 2012-04-29 DIAGNOSIS — H4011X0 Primary open-angle glaucoma, stage unspecified: Secondary | ICD-10-CM | POA: Diagnosis not present

## 2012-04-29 DIAGNOSIS — H409 Unspecified glaucoma: Secondary | ICD-10-CM | POA: Diagnosis not present

## 2012-05-06 DIAGNOSIS — C44611 Basal cell carcinoma of skin of unspecified upper limb, including shoulder: Secondary | ICD-10-CM | POA: Diagnosis not present

## 2012-05-06 DIAGNOSIS — D485 Neoplasm of uncertain behavior of skin: Secondary | ICD-10-CM | POA: Diagnosis not present

## 2012-05-06 DIAGNOSIS — L570 Actinic keratosis: Secondary | ICD-10-CM | POA: Diagnosis not present

## 2012-05-06 DIAGNOSIS — Z85820 Personal history of malignant melanoma of skin: Secondary | ICD-10-CM | POA: Diagnosis not present

## 2012-05-06 DIAGNOSIS — L218 Other seborrheic dermatitis: Secondary | ICD-10-CM | POA: Diagnosis not present

## 2012-05-06 DIAGNOSIS — Z85828 Personal history of other malignant neoplasm of skin: Secondary | ICD-10-CM | POA: Diagnosis not present

## 2012-05-14 DIAGNOSIS — M79609 Pain in unspecified limb: Secondary | ICD-10-CM | POA: Diagnosis not present

## 2012-05-14 DIAGNOSIS — B351 Tinea unguium: Secondary | ICD-10-CM | POA: Diagnosis not present

## 2012-05-15 DIAGNOSIS — L905 Scar conditions and fibrosis of skin: Secondary | ICD-10-CM | POA: Diagnosis not present

## 2012-05-15 DIAGNOSIS — C44611 Basal cell carcinoma of skin of unspecified upper limb, including shoulder: Secondary | ICD-10-CM | POA: Diagnosis not present

## 2012-05-21 DIAGNOSIS — I10 Essential (primary) hypertension: Secondary | ICD-10-CM | POA: Diagnosis not present

## 2012-05-21 DIAGNOSIS — E7800 Pure hypercholesterolemia, unspecified: Secondary | ICD-10-CM | POA: Diagnosis not present

## 2012-05-21 DIAGNOSIS — M109 Gout, unspecified: Secondary | ICD-10-CM | POA: Diagnosis not present

## 2012-05-21 DIAGNOSIS — Z7901 Long term (current) use of anticoagulants: Secondary | ICD-10-CM | POA: Diagnosis not present

## 2012-05-21 DIAGNOSIS — I4891 Unspecified atrial fibrillation: Secondary | ICD-10-CM | POA: Diagnosis not present

## 2012-06-05 DIAGNOSIS — I10 Essential (primary) hypertension: Secondary | ICD-10-CM | POA: Diagnosis not present

## 2012-06-05 DIAGNOSIS — I4891 Unspecified atrial fibrillation: Secondary | ICD-10-CM | POA: Diagnosis not present

## 2012-06-20 DIAGNOSIS — Z7901 Long term (current) use of anticoagulants: Secondary | ICD-10-CM | POA: Diagnosis not present

## 2012-07-22 ENCOUNTER — Ambulatory Visit
Admission: RE | Admit: 2012-07-22 | Discharge: 2012-07-22 | Disposition: A | Discharge status: Home / Self Care | Payer: Medicare Other | Source: Ambulatory Visit | Attending: Family Medicine | Admitting: Family Medicine

## 2012-07-22 ENCOUNTER — Other Ambulatory Visit: Payer: Self-pay | Admitting: Family Medicine

## 2012-07-22 DIAGNOSIS — R52 Pain, unspecified: Secondary | ICD-10-CM

## 2012-07-22 DIAGNOSIS — M7989 Other specified soft tissue disorders: Secondary | ICD-10-CM | POA: Diagnosis not present

## 2012-07-22 DIAGNOSIS — Z7901 Long term (current) use of anticoagulants: Secondary | ICD-10-CM | POA: Diagnosis not present

## 2012-07-22 DIAGNOSIS — T148XXA Other injury of unspecified body region, initial encounter: Secondary | ICD-10-CM | POA: Diagnosis not present

## 2012-07-22 DIAGNOSIS — R609 Edema, unspecified: Secondary | ICD-10-CM

## 2012-07-22 DIAGNOSIS — M79609 Pain in unspecified limb: Secondary | ICD-10-CM | POA: Diagnosis not present

## 2012-07-29 DIAGNOSIS — Z7901 Long term (current) use of anticoagulants: Secondary | ICD-10-CM | POA: Diagnosis not present

## 2012-07-29 DIAGNOSIS — H409 Unspecified glaucoma: Secondary | ICD-10-CM | POA: Diagnosis not present

## 2012-07-29 DIAGNOSIS — H4011X0 Primary open-angle glaucoma, stage unspecified: Secondary | ICD-10-CM | POA: Diagnosis not present

## 2012-07-30 DIAGNOSIS — B351 Tinea unguium: Secondary | ICD-10-CM | POA: Diagnosis not present

## 2012-07-30 DIAGNOSIS — M79609 Pain in unspecified limb: Secondary | ICD-10-CM | POA: Diagnosis not present

## 2012-08-01 DIAGNOSIS — Z7901 Long term (current) use of anticoagulants: Secondary | ICD-10-CM | POA: Diagnosis not present

## 2012-08-19 DIAGNOSIS — Z7901 Long term (current) use of anticoagulants: Secondary | ICD-10-CM | POA: Diagnosis not present

## 2012-09-11 DIAGNOSIS — Z7901 Long term (current) use of anticoagulants: Secondary | ICD-10-CM | POA: Diagnosis not present

## 2012-09-24 DIAGNOSIS — Z7901 Long term (current) use of anticoagulants: Secondary | ICD-10-CM | POA: Diagnosis not present

## 2012-10-29 DIAGNOSIS — B351 Tinea unguium: Secondary | ICD-10-CM | POA: Diagnosis not present

## 2012-10-29 DIAGNOSIS — M79609 Pain in unspecified limb: Secondary | ICD-10-CM | POA: Diagnosis not present

## 2012-10-30 DIAGNOSIS — Z7901 Long term (current) use of anticoagulants: Secondary | ICD-10-CM | POA: Diagnosis not present

## 2012-11-04 DIAGNOSIS — Z85828 Personal history of other malignant neoplasm of skin: Secondary | ICD-10-CM | POA: Diagnosis not present

## 2012-11-04 DIAGNOSIS — C44621 Squamous cell carcinoma of skin of unspecified upper limb, including shoulder: Secondary | ICD-10-CM | POA: Diagnosis not present

## 2012-11-11 DIAGNOSIS — H4011X0 Primary open-angle glaucoma, stage unspecified: Secondary | ICD-10-CM | POA: Diagnosis not present

## 2012-11-11 DIAGNOSIS — H409 Unspecified glaucoma: Secondary | ICD-10-CM | POA: Diagnosis not present

## 2012-11-11 DIAGNOSIS — Z961 Presence of intraocular lens: Secondary | ICD-10-CM | POA: Diagnosis not present

## 2012-11-20 DIAGNOSIS — H26499 Other secondary cataract, unspecified eye: Secondary | ICD-10-CM | POA: Diagnosis not present

## 2012-12-03 DIAGNOSIS — Z7901 Long term (current) use of anticoagulants: Secondary | ICD-10-CM | POA: Diagnosis not present

## 2012-12-04 DIAGNOSIS — N401 Enlarged prostate with lower urinary tract symptoms: Secondary | ICD-10-CM | POA: Diagnosis not present

## 2012-12-10 DIAGNOSIS — M109 Gout, unspecified: Secondary | ICD-10-CM | POA: Diagnosis not present

## 2012-12-10 DIAGNOSIS — Z7901 Long term (current) use of anticoagulants: Secondary | ICD-10-CM | POA: Diagnosis not present

## 2012-12-10 DIAGNOSIS — E7800 Pure hypercholesterolemia, unspecified: Secondary | ICD-10-CM | POA: Diagnosis not present

## 2012-12-10 DIAGNOSIS — Z1331 Encounter for screening for depression: Secondary | ICD-10-CM | POA: Diagnosis not present

## 2012-12-10 DIAGNOSIS — Z0000 Encounter for general adult medical examination without abnormal findings: Secondary | ICD-10-CM | POA: Diagnosis not present

## 2012-12-10 DIAGNOSIS — I4891 Unspecified atrial fibrillation: Secondary | ICD-10-CM | POA: Diagnosis not present

## 2012-12-10 DIAGNOSIS — I10 Essential (primary) hypertension: Secondary | ICD-10-CM | POA: Diagnosis not present

## 2012-12-26 DIAGNOSIS — Z7901 Long term (current) use of anticoagulants: Secondary | ICD-10-CM | POA: Diagnosis not present

## 2013-01-09 DIAGNOSIS — Z7901 Long term (current) use of anticoagulants: Secondary | ICD-10-CM | POA: Diagnosis not present

## 2013-01-21 DIAGNOSIS — B351 Tinea unguium: Secondary | ICD-10-CM | POA: Diagnosis not present

## 2013-01-21 DIAGNOSIS — M79609 Pain in unspecified limb: Secondary | ICD-10-CM | POA: Diagnosis not present

## 2013-02-06 DIAGNOSIS — Z7901 Long term (current) use of anticoagulants: Secondary | ICD-10-CM | POA: Diagnosis not present

## 2013-02-06 DIAGNOSIS — R229 Localized swelling, mass and lump, unspecified: Secondary | ICD-10-CM | POA: Diagnosis not present

## 2013-02-10 DIAGNOSIS — H409 Unspecified glaucoma: Secondary | ICD-10-CM | POA: Diagnosis not present

## 2013-02-10 DIAGNOSIS — H4011X0 Primary open-angle glaucoma, stage unspecified: Secondary | ICD-10-CM | POA: Diagnosis not present

## 2013-02-13 DIAGNOSIS — L02419 Cutaneous abscess of limb, unspecified: Secondary | ICD-10-CM | POA: Diagnosis not present

## 2013-02-17 DIAGNOSIS — Z7901 Long term (current) use of anticoagulants: Secondary | ICD-10-CM | POA: Diagnosis not present

## 2013-02-19 DIAGNOSIS — S8010XA Contusion of unspecified lower leg, initial encounter: Secondary | ICD-10-CM | POA: Diagnosis not present

## 2013-03-26 DIAGNOSIS — Z23 Encounter for immunization: Secondary | ICD-10-CM | POA: Diagnosis not present

## 2013-03-26 DIAGNOSIS — Z7901 Long term (current) use of anticoagulants: Secondary | ICD-10-CM | POA: Diagnosis not present

## 2013-04-24 DIAGNOSIS — I4891 Unspecified atrial fibrillation: Secondary | ICD-10-CM | POA: Diagnosis not present

## 2013-04-24 DIAGNOSIS — B351 Tinea unguium: Secondary | ICD-10-CM | POA: Diagnosis not present

## 2013-04-24 DIAGNOSIS — M79609 Pain in unspecified limb: Secondary | ICD-10-CM | POA: Diagnosis not present

## 2013-04-24 DIAGNOSIS — Z7901 Long term (current) use of anticoagulants: Secondary | ICD-10-CM | POA: Diagnosis not present

## 2013-05-06 DIAGNOSIS — Z7901 Long term (current) use of anticoagulants: Secondary | ICD-10-CM | POA: Diagnosis not present

## 2013-05-06 DIAGNOSIS — I4891 Unspecified atrial fibrillation: Secondary | ICD-10-CM | POA: Diagnosis not present

## 2013-05-13 DIAGNOSIS — Z85820 Personal history of malignant melanoma of skin: Secondary | ICD-10-CM | POA: Diagnosis not present

## 2013-05-13 DIAGNOSIS — D1801 Hemangioma of skin and subcutaneous tissue: Secondary | ICD-10-CM | POA: Diagnosis not present

## 2013-05-13 DIAGNOSIS — L905 Scar conditions and fibrosis of skin: Secondary | ICD-10-CM | POA: Diagnosis not present

## 2013-05-13 DIAGNOSIS — D485 Neoplasm of uncertain behavior of skin: Secondary | ICD-10-CM | POA: Diagnosis not present

## 2013-05-13 DIAGNOSIS — C4441 Basal cell carcinoma of skin of scalp and neck: Secondary | ICD-10-CM | POA: Diagnosis not present

## 2013-05-13 DIAGNOSIS — L910 Hypertrophic scar: Secondary | ICD-10-CM | POA: Diagnosis not present

## 2013-05-13 DIAGNOSIS — L821 Other seborrheic keratosis: Secondary | ICD-10-CM | POA: Diagnosis not present

## 2013-05-15 DIAGNOSIS — Z7901 Long term (current) use of anticoagulants: Secondary | ICD-10-CM | POA: Diagnosis not present

## 2013-05-15 DIAGNOSIS — I4891 Unspecified atrial fibrillation: Secondary | ICD-10-CM | POA: Diagnosis not present

## 2013-05-19 DIAGNOSIS — H4011X0 Primary open-angle glaucoma, stage unspecified: Secondary | ICD-10-CM | POA: Diagnosis not present

## 2013-05-19 DIAGNOSIS — Z961 Presence of intraocular lens: Secondary | ICD-10-CM | POA: Diagnosis not present

## 2013-05-19 DIAGNOSIS — H409 Unspecified glaucoma: Secondary | ICD-10-CM | POA: Diagnosis not present

## 2013-05-30 ENCOUNTER — Encounter: Payer: Self-pay | Admitting: General Surgery

## 2013-05-30 DIAGNOSIS — I4891 Unspecified atrial fibrillation: Secondary | ICD-10-CM

## 2013-05-30 DIAGNOSIS — I482 Chronic atrial fibrillation, unspecified: Secondary | ICD-10-CM | POA: Insufficient documentation

## 2013-05-30 DIAGNOSIS — I10 Essential (primary) hypertension: Secondary | ICD-10-CM | POA: Insufficient documentation

## 2013-06-04 ENCOUNTER — Ambulatory Visit (INDEPENDENT_AMBULATORY_CARE_PROVIDER_SITE_OTHER): Payer: Medicare Other | Admitting: Cardiology

## 2013-06-04 ENCOUNTER — Encounter: Payer: Self-pay | Admitting: General Surgery

## 2013-06-04 ENCOUNTER — Encounter: Payer: Self-pay | Admitting: Cardiology

## 2013-06-04 VITALS — BP 126/88 | HR 64 | Ht 72.25 in | Wt 185.0 lb

## 2013-06-04 DIAGNOSIS — I4891 Unspecified atrial fibrillation: Secondary | ICD-10-CM

## 2013-06-04 DIAGNOSIS — I10 Essential (primary) hypertension: Secondary | ICD-10-CM

## 2013-06-04 DIAGNOSIS — Z7901 Long term (current) use of anticoagulants: Secondary | ICD-10-CM

## 2013-06-04 DIAGNOSIS — D696 Thrombocytopenia, unspecified: Secondary | ICD-10-CM | POA: Insufficient documentation

## 2013-06-04 DIAGNOSIS — I482 Chronic atrial fibrillation, unspecified: Secondary | ICD-10-CM

## 2013-06-04 NOTE — Progress Notes (Signed)
[address] [place], [place]  [address] Phone: [phone] Fax:  [phone]  Date:  06/04/2013   ID:  Isaac Villa, DOB 04-30-1925, MRN [id number]  PCP:  Darrow Bussing, MD  Cardiologist:  Armanda Magic, MD     History of Present Illness: Isaac Villa is a 79 y.o. male with a history of chronic atrial fibrillation on systemic anticoagulation and HTN who presents today for followup.  He is doing well.  He denies any chest pain, SOB, DOE, LE edema, dizziness, palpitations or syncope.     Wt Readings from Last 3 Encounters:  06/04/13 185 lb (83.915 kg)  05/30/13 184 lb 3.2 oz (83.553 kg)     Past Medical History  Diagnosis Date  . Melanoma   . Herniated disc   . Hernia   . Glaucoma   . Gout   . Atrial fibrillation     Chronic  . Elevated cholesterol   . Hypertension   . BPH (benign prostatic hypertrophy)   . Thrombocytopenia   . Melanoma     Current Outpatient Prescriptions  Medication Sig Dispense Refill  . allopurinol (ZYLOPRIM) 300 MG tablet Take 300 mg by mouth daily.      Marland Kitchen atorvastatin (LIPITOR) 10 MG tablet Take 10 mg by mouth daily.      . Calcium Carb-Cholecalciferol (CALCIUM + D3) 600-200 MG-UNIT TABS Take 1 tablet by mouth daily.      . digoxin (LANOXIN) 0.125 MG tablet Take 0.125 mg by mouth daily.      Marland Kitchen diltiazem (CARDIZEM CD) 180 MG 24 hr capsule Take 180 mg by mouth daily.      . finasteride (PROSCAR) 5 MG tablet Take 5 mg by mouth daily.      . hydrochlorothiazide (HYDRODIURIL) 25 MG tablet Take 25 mg by mouth daily.      Marland Kitchen latanoprost (XALATAN) 0.005 % ophthalmic solution 1 drop at bedtime. 1 drop into infected eye in the evening      . Magnesium 400 MG CAPS Take 1 tablet by mouth.      . Multiple Vitamin (MULTIVITAMIN) tablet Take 1 tablet by mouth daily.      . Omega-3 Fatty Acids (FISH OIL) 1000 MG CAPS Take 1 capsule by mouth daily.      . potassium chloride (KLOR-CON) 20 MEQ packet Take 20 mEq by mouth once.      . tamsulosin  (FLOMAX) 0.4 MG CAPS capsule Take 0.4 mg by mouth daily.      Marland Kitchen warfarin (COUMADIN) 5 MG tablet Take 5 mg by mouth daily. As directed by physician       No current facility-administered medications for this visit.    Allergies:    Allergies  Allergen Reactions  . Lovenox [Enoxaparin Sodium]   . Penicillins     Social History:  The patient  reports that he has quit smoking. He does not have any smokeless tobacco history on file. He reports that he drinks alcohol. He reports that he does not use illicit drugs.   Family History:  The patient's family history includes Hypertension in his mother; Prostate cancer in his father.   ROS:  Please see the history of present illness.      All other systems reviewed and negative.   PHYSICAL EXAM: VS:  BP 126/88  Pulse 64  Ht 6' 0.25" (1.835 m)  Wt 185 lb (83.915 kg)  BMI 24.92 kg/m2 Well nourished, well developed, in no acute distress HEENT:  normal Neck: no JVD Cardiac:  normal S1, S2; irregularly irregular; no murmur Lungs:  clear to auscultation bilaterally, no wheezing, rhonchi or rales Abd: soft, nontender, no hepatomegaly Ext: no edema Skin: warm and dry Neuro:  CNs 2-12 intact, no focal abnormalities noted  EKG:  Atrial fibrillation with IVCD     ASSESSMENT AND PLAN:  1. Chronic atrial fibrillation rate controlled  - continue digoxin/diltiazem/warfarin 2. HTN - controlled  - continue diltiazem/HCTZ/ 3. Chronic systemic anticoagulation  Followup with me in 1 year  Signed, Armanda Magic, MD 06/04/2013 9:26 AM

## 2013-06-04 NOTE — Patient Instructions (Addendum)
Your physician recommends that you continue on your current medications as directed. Please refer to the Current Medication list given to you today.  We will be faxing a letter to your Primary Care Provider  To draw Digoxin level at your office visit.  Your physician wants you to follow-up in: 1 year with Dr Sherlyn Lick will receive a reminder letter in the mail two months in advance. If you don't receive a letter, please call our office to schedule the follow-up appointment.

## 2013-06-11 ENCOUNTER — Encounter: Payer: Self-pay | Admitting: Cardiology

## 2013-06-11 DIAGNOSIS — I10 Essential (primary) hypertension: Secondary | ICD-10-CM | POA: Diagnosis not present

## 2013-06-11 DIAGNOSIS — M109 Gout, unspecified: Secondary | ICD-10-CM | POA: Diagnosis not present

## 2013-06-11 DIAGNOSIS — E785 Hyperlipidemia, unspecified: Secondary | ICD-10-CM | POA: Diagnosis not present

## 2013-06-11 DIAGNOSIS — N400 Enlarged prostate without lower urinary tract symptoms: Secondary | ICD-10-CM | POA: Diagnosis not present

## 2013-06-11 DIAGNOSIS — I4891 Unspecified atrial fibrillation: Secondary | ICD-10-CM | POA: Diagnosis not present

## 2013-06-25 DIAGNOSIS — M1710 Unilateral primary osteoarthritis, unspecified knee: Secondary | ICD-10-CM | POA: Diagnosis not present

## 2013-06-25 DIAGNOSIS — Z471 Aftercare following joint replacement surgery: Secondary | ICD-10-CM | POA: Diagnosis not present

## 2013-06-25 DIAGNOSIS — Z96659 Presence of unspecified artificial knee joint: Secondary | ICD-10-CM | POA: Diagnosis not present

## 2013-07-02 DIAGNOSIS — Z7901 Long term (current) use of anticoagulants: Secondary | ICD-10-CM | POA: Diagnosis not present

## 2013-07-02 DIAGNOSIS — R7309 Other abnormal glucose: Secondary | ICD-10-CM | POA: Diagnosis not present

## 2013-07-02 DIAGNOSIS — I4891 Unspecified atrial fibrillation: Secondary | ICD-10-CM | POA: Diagnosis not present

## 2013-07-17 DIAGNOSIS — C4441 Basal cell carcinoma of skin of scalp and neck: Secondary | ICD-10-CM | POA: Diagnosis not present

## 2013-07-17 DIAGNOSIS — C4491 Basal cell carcinoma of skin, unspecified: Secondary | ICD-10-CM | POA: Diagnosis not present

## 2013-07-18 DIAGNOSIS — M1710 Unilateral primary osteoarthritis, unspecified knee: Secondary | ICD-10-CM | POA: Diagnosis not present

## 2013-07-18 DIAGNOSIS — Z96659 Presence of unspecified artificial knee joint: Secondary | ICD-10-CM | POA: Diagnosis not present

## 2013-07-18 DIAGNOSIS — IMO0002 Reserved for concepts with insufficient information to code with codable children: Secondary | ICD-10-CM | POA: Diagnosis not present

## 2013-07-25 DIAGNOSIS — M1710 Unilateral primary osteoarthritis, unspecified knee: Secondary | ICD-10-CM | POA: Diagnosis not present

## 2013-07-25 DIAGNOSIS — IMO0002 Reserved for concepts with insufficient information to code with codable children: Secondary | ICD-10-CM | POA: Diagnosis not present

## 2013-07-28 DIAGNOSIS — B351 Tinea unguium: Secondary | ICD-10-CM | POA: Diagnosis not present

## 2013-07-28 DIAGNOSIS — M79609 Pain in unspecified limb: Secondary | ICD-10-CM | POA: Diagnosis not present

## 2013-07-28 DIAGNOSIS — Z7901 Long term (current) use of anticoagulants: Secondary | ICD-10-CM | POA: Diagnosis not present

## 2013-07-28 DIAGNOSIS — I4891 Unspecified atrial fibrillation: Secondary | ICD-10-CM | POA: Diagnosis not present

## 2013-08-01 DIAGNOSIS — M1710 Unilateral primary osteoarthritis, unspecified knee: Secondary | ICD-10-CM | POA: Diagnosis not present

## 2013-08-01 DIAGNOSIS — IMO0002 Reserved for concepts with insufficient information to code with codable children: Secondary | ICD-10-CM | POA: Diagnosis not present

## 2013-08-18 DIAGNOSIS — H409 Unspecified glaucoma: Secondary | ICD-10-CM | POA: Diagnosis not present

## 2013-08-18 DIAGNOSIS — H4011X0 Primary open-angle glaucoma, stage unspecified: Secondary | ICD-10-CM | POA: Diagnosis not present

## 2013-08-22 DIAGNOSIS — Z7901 Long term (current) use of anticoagulants: Secondary | ICD-10-CM | POA: Diagnosis not present

## 2013-08-22 DIAGNOSIS — I4891 Unspecified atrial fibrillation: Secondary | ICD-10-CM | POA: Diagnosis not present

## 2013-08-27 DIAGNOSIS — Z7901 Long term (current) use of anticoagulants: Secondary | ICD-10-CM | POA: Diagnosis not present

## 2013-08-27 DIAGNOSIS — I4891 Unspecified atrial fibrillation: Secondary | ICD-10-CM | POA: Diagnosis not present

## 2013-09-09 DIAGNOSIS — Z7901 Long term (current) use of anticoagulants: Secondary | ICD-10-CM | POA: Diagnosis not present

## 2013-09-09 DIAGNOSIS — I4891 Unspecified atrial fibrillation: Secondary | ICD-10-CM | POA: Diagnosis not present

## 2013-09-16 DIAGNOSIS — Z7901 Long term (current) use of anticoagulants: Secondary | ICD-10-CM | POA: Diagnosis not present

## 2013-09-16 DIAGNOSIS — I4891 Unspecified atrial fibrillation: Secondary | ICD-10-CM | POA: Diagnosis not present

## 2013-10-08 DIAGNOSIS — Z7901 Long term (current) use of anticoagulants: Secondary | ICD-10-CM | POA: Diagnosis not present

## 2013-10-08 DIAGNOSIS — I4891 Unspecified atrial fibrillation: Secondary | ICD-10-CM | POA: Diagnosis not present

## 2013-10-23 DIAGNOSIS — B351 Tinea unguium: Secondary | ICD-10-CM | POA: Diagnosis not present

## 2013-10-23 DIAGNOSIS — M79609 Pain in unspecified limb: Secondary | ICD-10-CM | POA: Diagnosis not present

## 2013-11-13 DIAGNOSIS — L570 Actinic keratosis: Secondary | ICD-10-CM | POA: Diagnosis not present

## 2013-11-13 DIAGNOSIS — D1801 Hemangioma of skin and subcutaneous tissue: Secondary | ICD-10-CM | POA: Diagnosis not present

## 2013-11-13 DIAGNOSIS — Z85828 Personal history of other malignant neoplasm of skin: Secondary | ICD-10-CM | POA: Diagnosis not present

## 2013-11-13 DIAGNOSIS — L821 Other seborrheic keratosis: Secondary | ICD-10-CM | POA: Diagnosis not present

## 2013-11-13 DIAGNOSIS — Z85820 Personal history of malignant melanoma of skin: Secondary | ICD-10-CM | POA: Diagnosis not present

## 2013-11-13 DIAGNOSIS — D485 Neoplasm of uncertain behavior of skin: Secondary | ICD-10-CM | POA: Diagnosis not present

## 2013-11-17 DIAGNOSIS — H409 Unspecified glaucoma: Secondary | ICD-10-CM | POA: Diagnosis not present

## 2013-11-17 DIAGNOSIS — H4011X0 Primary open-angle glaucoma, stage unspecified: Secondary | ICD-10-CM | POA: Diagnosis not present

## 2013-11-26 DIAGNOSIS — Z7901 Long term (current) use of anticoagulants: Secondary | ICD-10-CM | POA: Diagnosis not present

## 2013-11-26 DIAGNOSIS — I4891 Unspecified atrial fibrillation: Secondary | ICD-10-CM | POA: Diagnosis not present

## 2013-12-10 DIAGNOSIS — N200 Calculus of kidney: Secondary | ICD-10-CM | POA: Diagnosis not present

## 2013-12-10 DIAGNOSIS — N139 Obstructive and reflux uropathy, unspecified: Secondary | ICD-10-CM | POA: Diagnosis not present

## 2013-12-10 DIAGNOSIS — N138 Other obstructive and reflux uropathy: Secondary | ICD-10-CM | POA: Diagnosis not present

## 2013-12-10 DIAGNOSIS — N401 Enlarged prostate with lower urinary tract symptoms: Secondary | ICD-10-CM | POA: Diagnosis not present

## 2013-12-10 DIAGNOSIS — N281 Cyst of kidney, acquired: Secondary | ICD-10-CM | POA: Diagnosis not present

## 2013-12-16 DIAGNOSIS — D239 Other benign neoplasm of skin, unspecified: Secondary | ICD-10-CM | POA: Diagnosis not present

## 2013-12-16 DIAGNOSIS — Z23 Encounter for immunization: Secondary | ICD-10-CM | POA: Diagnosis not present

## 2013-12-16 DIAGNOSIS — I4891 Unspecified atrial fibrillation: Secondary | ICD-10-CM | POA: Diagnosis not present

## 2013-12-16 DIAGNOSIS — Z0000 Encounter for general adult medical examination without abnormal findings: Secondary | ICD-10-CM | POA: Diagnosis not present

## 2013-12-16 DIAGNOSIS — E785 Hyperlipidemia, unspecified: Secondary | ICD-10-CM | POA: Diagnosis not present

## 2013-12-16 DIAGNOSIS — I10 Essential (primary) hypertension: Secondary | ICD-10-CM | POA: Diagnosis not present

## 2013-12-16 DIAGNOSIS — M109 Gout, unspecified: Secondary | ICD-10-CM | POA: Diagnosis not present

## 2013-12-16 DIAGNOSIS — Z79899 Other long term (current) drug therapy: Secondary | ICD-10-CM | POA: Diagnosis not present

## 2013-12-17 DIAGNOSIS — D485 Neoplasm of uncertain behavior of skin: Secondary | ICD-10-CM | POA: Diagnosis not present

## 2013-12-24 DIAGNOSIS — C4359 Malignant melanoma of other part of trunk: Secondary | ICD-10-CM | POA: Diagnosis not present

## 2013-12-24 DIAGNOSIS — D485 Neoplasm of uncertain behavior of skin: Secondary | ICD-10-CM | POA: Diagnosis not present

## 2014-01-13 DIAGNOSIS — I4891 Unspecified atrial fibrillation: Secondary | ICD-10-CM | POA: Diagnosis not present

## 2014-01-13 DIAGNOSIS — Z7901 Long term (current) use of anticoagulants: Secondary | ICD-10-CM | POA: Diagnosis not present

## 2014-01-14 DIAGNOSIS — M79609 Pain in unspecified limb: Secondary | ICD-10-CM | POA: Diagnosis not present

## 2014-01-14 DIAGNOSIS — B351 Tinea unguium: Secondary | ICD-10-CM | POA: Diagnosis not present

## 2014-02-16 DIAGNOSIS — H4011X0 Primary open-angle glaucoma, stage unspecified: Secondary | ICD-10-CM | POA: Diagnosis not present

## 2014-02-16 DIAGNOSIS — H409 Unspecified glaucoma: Secondary | ICD-10-CM | POA: Diagnosis not present

## 2014-03-10 DIAGNOSIS — Z7901 Long term (current) use of anticoagulants: Secondary | ICD-10-CM | POA: Diagnosis not present

## 2014-03-10 DIAGNOSIS — I4891 Unspecified atrial fibrillation: Secondary | ICD-10-CM | POA: Diagnosis not present

## 2014-03-18 DIAGNOSIS — Z7901 Long term (current) use of anticoagulants: Secondary | ICD-10-CM | POA: Diagnosis not present

## 2014-03-18 DIAGNOSIS — Z23 Encounter for immunization: Secondary | ICD-10-CM | POA: Diagnosis not present

## 2014-03-18 DIAGNOSIS — J069 Acute upper respiratory infection, unspecified: Secondary | ICD-10-CM | POA: Diagnosis not present

## 2014-03-18 DIAGNOSIS — I4891 Unspecified atrial fibrillation: Secondary | ICD-10-CM | POA: Diagnosis not present

## 2014-03-18 DIAGNOSIS — R609 Edema, unspecified: Secondary | ICD-10-CM | POA: Diagnosis not present

## 2014-03-25 DIAGNOSIS — I4891 Unspecified atrial fibrillation: Secondary | ICD-10-CM | POA: Diagnosis not present

## 2014-03-25 DIAGNOSIS — Z7901 Long term (current) use of anticoagulants: Secondary | ICD-10-CM | POA: Diagnosis not present

## 2014-03-31 ENCOUNTER — Ambulatory Visit
Admission: RE | Admit: 2014-03-31 | Discharge: 2014-03-31 | Disposition: A | Discharge status: Home / Self Care | Payer: Medicare Other | Source: Ambulatory Visit | Attending: Family Medicine | Admitting: Family Medicine

## 2014-03-31 ENCOUNTER — Other Ambulatory Visit: Payer: Self-pay | Admitting: Family Medicine

## 2014-03-31 DIAGNOSIS — S4980XA Other specified injuries of shoulder and upper arm, unspecified arm, initial encounter: Secondary | ICD-10-CM | POA: Diagnosis not present

## 2014-03-31 DIAGNOSIS — M7989 Other specified soft tissue disorders: Secondary | ICD-10-CM | POA: Diagnosis not present

## 2014-03-31 DIAGNOSIS — I4891 Unspecified atrial fibrillation: Secondary | ICD-10-CM | POA: Diagnosis not present

## 2014-03-31 DIAGNOSIS — S46909A Unspecified injury of unspecified muscle, fascia and tendon at shoulder and upper arm level, unspecified arm, initial encounter: Secondary | ICD-10-CM | POA: Diagnosis not present

## 2014-03-31 DIAGNOSIS — M25519 Pain in unspecified shoulder: Secondary | ICD-10-CM | POA: Diagnosis not present

## 2014-04-01 DIAGNOSIS — M67919 Unspecified disorder of synovium and tendon, unspecified shoulder: Secondary | ICD-10-CM | POA: Diagnosis not present

## 2014-04-01 DIAGNOSIS — M25519 Pain in unspecified shoulder: Secondary | ICD-10-CM | POA: Diagnosis not present

## 2014-04-07 DIAGNOSIS — Z7901 Long term (current) use of anticoagulants: Secondary | ICD-10-CM | POA: Diagnosis not present

## 2014-04-07 DIAGNOSIS — I4891 Unspecified atrial fibrillation: Secondary | ICD-10-CM | POA: Diagnosis not present

## 2014-04-10 DIAGNOSIS — I4891 Unspecified atrial fibrillation: Secondary | ICD-10-CM | POA: Diagnosis not present

## 2014-04-10 DIAGNOSIS — Z7901 Long term (current) use of anticoagulants: Secondary | ICD-10-CM | POA: Diagnosis not present

## 2014-04-13 DIAGNOSIS — M79675 Pain in left toe(s): Secondary | ICD-10-CM | POA: Diagnosis not present

## 2014-04-13 DIAGNOSIS — M79674 Pain in right toe(s): Secondary | ICD-10-CM | POA: Diagnosis not present

## 2014-04-13 DIAGNOSIS — B351 Tinea unguium: Secondary | ICD-10-CM | POA: Diagnosis not present

## 2014-04-15 DIAGNOSIS — Z7901 Long term (current) use of anticoagulants: Secondary | ICD-10-CM | POA: Diagnosis not present

## 2014-04-15 DIAGNOSIS — M25512 Pain in left shoulder: Secondary | ICD-10-CM | POA: Diagnosis not present

## 2014-04-15 DIAGNOSIS — I4891 Unspecified atrial fibrillation: Secondary | ICD-10-CM | POA: Diagnosis not present

## 2014-04-15 DIAGNOSIS — M7552 Bursitis of left shoulder: Secondary | ICD-10-CM | POA: Diagnosis not present

## 2014-04-23 DIAGNOSIS — I4891 Unspecified atrial fibrillation: Secondary | ICD-10-CM | POA: Diagnosis not present

## 2014-04-23 DIAGNOSIS — Z7901 Long term (current) use of anticoagulants: Secondary | ICD-10-CM | POA: Diagnosis not present

## 2014-05-01 DIAGNOSIS — Z7901 Long term (current) use of anticoagulants: Secondary | ICD-10-CM | POA: Diagnosis not present

## 2014-05-01 DIAGNOSIS — Z8679 Personal history of other diseases of the circulatory system: Secondary | ICD-10-CM | POA: Diagnosis not present

## 2014-05-06 DIAGNOSIS — M7552 Bursitis of left shoulder: Secondary | ICD-10-CM | POA: Diagnosis not present

## 2014-05-06 DIAGNOSIS — M25512 Pain in left shoulder: Secondary | ICD-10-CM | POA: Diagnosis not present

## 2014-05-13 DIAGNOSIS — Z8679 Personal history of other diseases of the circulatory system: Secondary | ICD-10-CM | POA: Diagnosis not present

## 2014-05-13 DIAGNOSIS — Z7901 Long term (current) use of anticoagulants: Secondary | ICD-10-CM | POA: Diagnosis not present

## 2014-05-18 DIAGNOSIS — H4011X1 Primary open-angle glaucoma, mild stage: Secondary | ICD-10-CM | POA: Diagnosis not present

## 2014-05-18 DIAGNOSIS — Z961 Presence of intraocular lens: Secondary | ICD-10-CM | POA: Diagnosis not present

## 2014-06-03 DIAGNOSIS — L821 Other seborrheic keratosis: Secondary | ICD-10-CM | POA: Diagnosis not present

## 2014-06-03 DIAGNOSIS — Z85828 Personal history of other malignant neoplasm of skin: Secondary | ICD-10-CM | POA: Diagnosis not present

## 2014-06-03 DIAGNOSIS — D1801 Hemangioma of skin and subcutaneous tissue: Secondary | ICD-10-CM | POA: Diagnosis not present

## 2014-06-03 DIAGNOSIS — Z85820 Personal history of malignant melanoma of skin: Secondary | ICD-10-CM | POA: Diagnosis not present

## 2014-06-03 DIAGNOSIS — L570 Actinic keratosis: Secondary | ICD-10-CM | POA: Diagnosis not present

## 2014-06-04 ENCOUNTER — Ambulatory Visit (INDEPENDENT_AMBULATORY_CARE_PROVIDER_SITE_OTHER): Payer: Medicare Other | Admitting: Cardiology

## 2014-06-04 ENCOUNTER — Encounter: Payer: Self-pay | Admitting: Cardiology

## 2014-06-04 VITALS — BP 160/64 | HR 72 | Ht 72.0 in | Wt 178.0 lb

## 2014-06-04 DIAGNOSIS — I482 Chronic atrial fibrillation, unspecified: Secondary | ICD-10-CM

## 2014-06-04 DIAGNOSIS — I10 Essential (primary) hypertension: Secondary | ICD-10-CM

## 2014-06-04 DIAGNOSIS — R609 Edema, unspecified: Secondary | ICD-10-CM | POA: Diagnosis not present

## 2014-06-04 DIAGNOSIS — I4891 Unspecified atrial fibrillation: Secondary | ICD-10-CM | POA: Diagnosis not present

## 2014-06-04 DIAGNOSIS — R600 Localized edema: Secondary | ICD-10-CM

## 2014-06-04 HISTORY — DX: Localized edema: R60.0

## 2014-06-04 LAB — BASIC METABOLIC PANEL
BUN: 18 mg/dL (ref 6–23)
CO2: 27 mEq/L (ref 19–32)
Calcium: 8.7 mg/dL (ref 8.4–10.5)
Chloride: 107 mEq/L (ref 96–112)
Creatinine, Ser: 1 mg/dL (ref 0.4–1.5)
GFR: 75.94 mL/min (ref >60.00)
Glucose, Bld: 101 mg/dL — ABNORMAL HIGH (ref 70–99)
POTASSIUM: 3.6 meq/L (ref 3.5–5.1)
Sodium: 141 mEq/L (ref 135–145)

## 2014-06-04 MED ORDER — DILTIAZEM HCL ER COATED BEADS 240 MG PO CP24: 240.0000 mg | ORAL_CAPSULE | Freq: Every day | ORAL | Status: DC

## 2014-06-04 NOTE — Patient Instructions (Signed)
Your physician recommends that you have lab work TODAY (BMET, dig level).  Your physician has recommended you make the following change in your medication:  1) INCREASE Cardizem to 240 mg daily. Check your blood pressure daily for one week and call us with the results.  Your physician wants you to follow-up in: 6 months with Dr. Radford Pax. You will receive a reminder letter in the mail two months in advance. If you don't receive a letter, please call our office to schedule the follow-up appointment.

## 2014-06-04 NOTE — Progress Notes (Signed)
[address] [place], [place]  [address] Phone: [phone] Fax:  [phone]  Date:  06/04/2014   ID:  Isaac Villa, DOB Dec 22, 1924, MRN [id number]  PCP:  [name], MD  Cardiologist:  Fransico Him, MD    History of Present Illness: Isaac Villa is a 80 y.o. male with a history of chronic atrial fibrillation on systemic anticoagulation and HTN who presents today for followup. He is doing well. He denies any chest pain, SOB, DOE, dizziness, palpitations or syncope. He occasionally has some mild RLE edema.   Wt Readings from Last 3 Encounters:  06/04/14 178 lb (80.74 kg)  06/04/13 185 lb (83.915 kg)  05/30/13 184 lb 3.2 oz (83.553 kg)     Past Medical History  Diagnosis Date  . Melanoma   . Herniated disc   . Hernia   . Glaucoma   . Gout   . Atrial fibrillation     Chronic  . Elevated cholesterol   . Hypertension   . BPH (benign prostatic hypertrophy)   . Thrombocytopenia   . Melanoma     Current Outpatient Prescriptions  Medication Sig Dispense Refill  . allopurinol (ZYLOPRIM) 300 MG tablet Take 300 mg by mouth daily.    Marland Kitchen atorvastatin (LIPITOR) 10 MG tablet Take 10 mg by mouth daily.    . Calcium Carb-Cholecalciferol (CALCIUM + D3) 600-200 MG-UNIT TABS Take 1 tablet by mouth daily.    . digoxin (LANOXIN) 0.125 MG tablet Take 0.125 mg by mouth daily.    Marland Kitchen diltiazem (CARDIZEM CD) 180 MG 24 hr capsule Take 180 mg by mouth daily.    . finasteride (PROSCAR) 5 MG tablet Take 5 mg by mouth daily.    . hydrochlorothiazide (HYDRODIURIL) 25 MG tablet Take 25 mg by mouth daily.    Marland Kitchen latanoprost (XALATAN) 0.005 % ophthalmic solution 1 drop at bedtime. 1 drop into infected eye in the evening    . Magnesium 400 MG CAPS Take 1 tablet by mouth.    . Multiple Vitamin (MULTIVITAMIN) tablet Take 1 tablet by mouth daily.    . Omega-3 Fatty Acids (FISH OIL) 1000 MG CAPS Take 1 capsule by mouth daily.    . potassium chloride (KLOR-CON) 20 MEQ packet Take 20 mEq  by mouth once.    . tamsulosin (FLOMAX) 0.4 MG CAPS capsule Take 0.4 mg by mouth daily.    Marland Kitchen warfarin (COUMADIN) 5 MG tablet Take 5 mg by mouth daily. As directed by physician     No current facility-administered medications for this visit.    Allergies:    Allergies  Allergen Reactions  . Lovenox [Enoxaparin Sodium]   . Penicillins     Social History:  The patient  reports that he has quit smoking. He does not have any smokeless tobacco history on file. He reports that he drinks alcohol. He reports that he does not use illicit drugs.   Family History:  The patient's family history includes Hypertension in his mother; Prostate cancer in his father.   ROS:  Please see the history of present illness.      All other systems reviewed and negative.   PHYSICAL EXAM: VS:  BP 160/64 mmHg  Pulse 72  Ht 6' (1.829 m)  Wt 178 lb (80.74 kg)  BMI 24.14 kg/m2 Well nourished, well developed, in no acute distress HEENT: normal Neck: no JVD Cardiac:  normal S1, S2; RRR; no murmur Lungs:  clear to auscultation bilaterally, no wheezing, rhonchi or rales  Abd: soft, nontender, no hepatomegaly Ext: no edema Skin: warm and dry Neuro:  CNs 2-12 intact, no focal abnormalities noted  EKG:  Atrial fibrillation with CVR, nonspecific IVCD     ASSESSMENT AND PLAN:  1. Chronic atrial fibrillation rate controlled - continue digoxin/diltiazem/warfarin 2. HTN - elevated today - continue diltiazem/HCTZ  - increase Diltiazem to 240mg  daily  - I have asked him to check his BP daily for a week and call with results  - check dig level and BMET 3. Chronic systemic anticoagulation 4. RLE edema intermittent and controlled on HCTZ   Followup with me in 6 months   Signed, Fransico Him, MD [facility] 06/04/2014 8:22 AM

## 2014-06-05 ENCOUNTER — Telehealth: Payer: Self-pay | Admitting: *Deleted

## 2014-06-05 DIAGNOSIS — I10 Essential (primary) hypertension: Secondary | ICD-10-CM

## 2014-06-05 LAB — DIGOXIN LEVEL: Digoxin Level: 1 ng/mL (ref 0.8–2.0)

## 2014-06-05 MED ORDER — POTASSIUM CHLORIDE ER 20 MEQ PO TBCR: 20.0000 meq | EXTENDED_RELEASE_TABLET | Freq: Two times a day (BID) | ORAL | Status: DC

## 2014-06-05 NOTE — Telephone Encounter (Signed)
s/w pt's wife about lab results as well as per Dr. Radford Pax to increase K+ 20 meq BID, BMET 12/10. Wife verbalized understanding to plan of care.

## 2014-06-09 DIAGNOSIS — Z7901 Long term (current) use of anticoagulants: Secondary | ICD-10-CM | POA: Diagnosis not present

## 2014-06-09 DIAGNOSIS — Z8679 Personal history of other diseases of the circulatory system: Secondary | ICD-10-CM | POA: Diagnosis not present

## 2014-06-10 ENCOUNTER — Telehealth: Payer: Self-pay | Admitting: Cardiology

## 2014-06-10 NOTE — Telephone Encounter (Signed)
Follow Up ° ° ° ° ° ° ° ° °Pt returning phone call. Please call back and advise. °

## 2014-06-10 NOTE — Telephone Encounter (Signed)
New Msg   Please contact pt at [phone], he is returning call from 2 days ago.

## 2014-06-10 NOTE — Telephone Encounter (Signed)
Informed patient that it is not documented that anyone called him.  Clarified with patient that lab work can be done anytime from [phone] tomorrow.  Patient grateful for call.

## 2014-06-11 ENCOUNTER — Other Ambulatory Visit (INDEPENDENT_AMBULATORY_CARE_PROVIDER_SITE_OTHER): Payer: Medicare Other | Admitting: *Deleted

## 2014-06-11 ENCOUNTER — Telehealth: Payer: Self-pay

## 2014-06-11 DIAGNOSIS — I10 Essential (primary) hypertension: Secondary | ICD-10-CM

## 2014-06-11 LAB — BASIC METABOLIC PANEL
BUN: 20 mg/dL (ref 6–23)
CHLORIDE: 102 meq/L (ref 96–112)
CO2: 28 meq/L (ref 19–32)
CREATININE: 1 mg/dL (ref 0.4–1.5)
Calcium: 8.8 mg/dL (ref 8.4–10.5)
GFR: 76.84 mL/min (ref >60.00)
Glucose, Bld: 104 mg/dL — ABNORMAL HIGH (ref 70–99)
Potassium: 3.9 mEq/L (ref 3.5–5.1)
SODIUM: 137 meq/L (ref 135–145)

## 2014-06-11 NOTE — Telephone Encounter (Signed)
Blood pressure log received in envelope.  12/4: AM 166/83 HR 62 12/5: AM 151/79 HR 63 12/6: AM 158/81 HR 67          PM 146/72 HR 53 12/7: AM 158/81 HR 67          PM 151/75 HR 59 12/8: AM 151/75 HR 59 12/9: AM 141/77 HR 69 12/10: AM 149/82 HR 77  To Dr. Radford Pax for review and recommendations.

## 2014-06-12 MED ORDER — LISINOPRIL 10 MG PO TABS: 10.0000 mg | ORAL_TABLET | Freq: Every day | ORAL | Status: DC

## 2014-06-12 NOTE — Telephone Encounter (Signed)
I have advised the patient to keep a record of his BP readings, about 1 hour after he takes his AM meds, and bring the readings with him when he comes for a repeat lab on 12/18. He is agreeable.

## 2014-06-12 NOTE — Telephone Encounter (Signed)
BP is still elevated.  Please add Lisinopril 10mg  daily and recheck BMET in 1 week

## 2014-06-12 NOTE — Telephone Encounter (Signed)
I spoke with the patient and he is aware of Dr. Theodosia Blender recommendations and agreeable with this. He will come for a repeat BMP on 06/19/14.

## 2014-06-18 DIAGNOSIS — E785 Hyperlipidemia, unspecified: Secondary | ICD-10-CM | POA: Diagnosis not present

## 2014-06-18 DIAGNOSIS — I10 Essential (primary) hypertension: Secondary | ICD-10-CM | POA: Diagnosis not present

## 2014-06-18 DIAGNOSIS — M109 Gout, unspecified: Secondary | ICD-10-CM | POA: Diagnosis not present

## 2014-06-18 DIAGNOSIS — I481 Persistent atrial fibrillation: Secondary | ICD-10-CM | POA: Diagnosis not present

## 2014-06-18 DIAGNOSIS — N400 Enlarged prostate without lower urinary tract symptoms: Secondary | ICD-10-CM | POA: Diagnosis not present

## 2014-06-18 DIAGNOSIS — Z5181 Encounter for therapeutic drug level monitoring: Secondary | ICD-10-CM | POA: Diagnosis not present

## 2014-06-19 ENCOUNTER — Telehealth: Payer: Self-pay | Admitting: Cardiology

## 2014-06-19 ENCOUNTER — Other Ambulatory Visit (INDEPENDENT_AMBULATORY_CARE_PROVIDER_SITE_OTHER): Payer: Medicare Other | Admitting: *Deleted

## 2014-06-19 DIAGNOSIS — I10 Essential (primary) hypertension: Secondary | ICD-10-CM

## 2014-06-19 LAB — BASIC METABOLIC PANEL
BUN: 17 mg/dL (ref 6–23)
CHLORIDE: 106 meq/L (ref 96–112)
CO2: 26 meq/L (ref 19–32)
Calcium: 8.7 mg/dL (ref 8.4–10.5)
Creatinine, Ser: 1 mg/dL (ref 0.4–1.5)
GFR: 75.94 mL/min (ref >60.00)
GLUCOSE: 100 mg/dL — AB (ref 70–99)
POTASSIUM: 4 meq/L (ref 3.5–5.1)
Sodium: 139 mEq/L (ref 135–145)

## 2014-06-19 NOTE — Telephone Encounter (Signed)
Patient brought in BP readings from home that range from 120-149/64-17mmHg.  Continue current meds.

## 2014-06-19 NOTE — Telephone Encounter (Signed)
Left message with Mrs. Frink to continue patient's current medications.

## 2014-07-06 DIAGNOSIS — B351 Tinea unguium: Secondary | ICD-10-CM | POA: Diagnosis not present

## 2014-07-06 DIAGNOSIS — M79674 Pain in right toe(s): Secondary | ICD-10-CM | POA: Diagnosis not present

## 2014-07-06 DIAGNOSIS — M79675 Pain in left toe(s): Secondary | ICD-10-CM | POA: Diagnosis not present

## 2014-07-07 DIAGNOSIS — Z5181 Encounter for therapeutic drug level monitoring: Secondary | ICD-10-CM | POA: Diagnosis not present

## 2014-07-07 DIAGNOSIS — J209 Acute bronchitis, unspecified: Secondary | ICD-10-CM | POA: Diagnosis not present

## 2014-07-10 DIAGNOSIS — Z7901 Long term (current) use of anticoagulants: Secondary | ICD-10-CM | POA: Diagnosis not present

## 2014-07-22 DIAGNOSIS — Z7901 Long term (current) use of anticoagulants: Secondary | ICD-10-CM | POA: Diagnosis not present

## 2014-08-11 DIAGNOSIS — Z7901 Long term (current) use of anticoagulants: Secondary | ICD-10-CM | POA: Diagnosis not present

## 2014-08-31 DIAGNOSIS — H4011X1 Primary open-angle glaucoma, mild stage: Secondary | ICD-10-CM | POA: Diagnosis not present

## 2014-09-07 DIAGNOSIS — Z7901 Long term (current) use of anticoagulants: Secondary | ICD-10-CM | POA: Diagnosis not present

## 2014-09-14 DIAGNOSIS — Z7901 Long term (current) use of anticoagulants: Secondary | ICD-10-CM | POA: Diagnosis not present

## 2014-10-05 DIAGNOSIS — Z7901 Long term (current) use of anticoagulants: Secondary | ICD-10-CM | POA: Diagnosis not present

## 2014-10-19 DIAGNOSIS — Z7901 Long term (current) use of anticoagulants: Secondary | ICD-10-CM | POA: Diagnosis not present

## 2014-11-02 DIAGNOSIS — M79674 Pain in right toe(s): Secondary | ICD-10-CM | POA: Diagnosis not present

## 2014-11-02 DIAGNOSIS — M79675 Pain in left toe(s): Secondary | ICD-10-CM | POA: Diagnosis not present

## 2014-11-02 DIAGNOSIS — B351 Tinea unguium: Secondary | ICD-10-CM | POA: Diagnosis not present

## 2014-11-06 ENCOUNTER — Other Ambulatory Visit: Payer: Self-pay | Admitting: Cardiology

## 2014-11-09 DIAGNOSIS — Z7901 Long term (current) use of anticoagulants: Secondary | ICD-10-CM | POA: Diagnosis not present

## 2014-11-30 ENCOUNTER — Other Ambulatory Visit: Payer: Self-pay | Admitting: Cardiology

## 2014-11-30 NOTE — Progress Notes (Signed)
Cardiology Office Note   Date:  12/01/2014   ID:  Isaac Villa, DOB 11/07/1924, MRN [id number]  PCP:  [name], MD    Chief Complaint  Patient presents with  . Follow-up    essential hypertension, atrial fibrillation      History of Present Illness: Isaac Villa is a 81 y.o. male with a history of chronic atrial fibrillation on systemic anticoagulation and HTN who presents today for followup. He is doing well. He denies any chest pain, SOB, DOE, dizziness, palpitations or syncope. He occasionally has some mild RLE edema that seems to have worsened recently.  He does not salt his food.  He says that the ankle also is painful.      Past Medical History  Diagnosis Date  . Melanoma   . Herniated disc   . Hernia   . Glaucoma   . Gout   . Atrial fibrillation     Chronic  . Elevated cholesterol   . Hypertension   . BPH (benign prostatic hypertrophy)   . Thrombocytopenia   . Melanoma   . Edema extremities 06/04/2014    Past Surgical History  Procedure Laterality Date  . Appendectomy    . Eye surgery      lens replacements  . Joint replacement      left knee  . Hernia repair    . Lumbar disc surgery  07/2004  . Tonsillectomy    . Replacement total knee Left 11/2006  . Cataract extraction Bilateral 04/2008     Current Outpatient Prescriptions  Medication Sig Dispense Refill  . allopurinol (ZYLOPRIM) 300 MG tablet Take 300 mg by mouth daily.    Marland Kitchen atorvastatin (LIPITOR) 10 MG tablet Take 10 mg by mouth daily.    . Calcium Carb-Cholecalciferol (CALCIUM + D3) 600-200 MG-UNIT TABS Take 1 tablet by mouth daily.    Marland Kitchen desonide (DESOWEN) 0.05 % cream Apply 1 application topically 2 (two) times daily.  2  . digoxin (LANOXIN) 0.125 MG tablet Take 0.125 mg by mouth daily.    Marland Kitchen diltiazem (CARDIZEM CD) 240 MG 24 hr capsule Take 1 capsule (240 mg total) by mouth daily. 30 capsule 6  . finasteride (PROSCAR) 5 MG tablet Take 5 mg by mouth daily.      . hydrochlorothiazide (HYDRODIURIL) 25 MG tablet Take 25 mg by mouth daily.    Marland Kitchen latanoprost (XALATAN) 0.005 % ophthalmic solution 1 drop at bedtime. 1 drop into infected eye in the evening    . lisinopril (PRINIVIL,ZESTRIL) 10 MG tablet TAKE 1 TABLET EVERY DAY 30 tablet 0  . Magnesium 400 MG CAPS Take 1 tablet by mouth.    . Multiple Vitamin (MULTIVITAMIN) tablet Take 1 tablet by mouth daily.    . Omega-3 Fatty Acids (FISH OIL) 1000 MG CAPS Take 1 capsule by mouth daily.    . Potassium Chloride ER 20 MEQ TBCR Take 20 mEq by mouth 2 (two) times daily. (Patient taking differently: Take 20 mEq by mouth daily. ) 60 tablet 11  . tamsulosin (FLOMAX) 0.4 MG CAPS capsule Take 0.4 mg by mouth daily.    Marland Kitchen warfarin (COUMADIN) 5 MG tablet Take 5 mg by mouth daily. As directed by physician     No current facility-administered medications for this visit.    Allergies:   Lovenox and Penicillins    Social History:  The patient  reports that he has quit  smoking. He does not have any smokeless tobacco history on file. He reports that he drinks alcohol. He reports that he does not use illicit drugs.   Family History:  The patient's family history includes Hypertension in his mother; Prostate cancer in his father.    ROS:  Please see the history of present illness.   Otherwise, review of systems are positive for none.   All other systems are reviewed and negative.    PHYSICAL EXAM: VS:  BP 148/80 mmHg  Pulse 60  Ht 6\' 2"  (1.88 m)  Wt 173 lb (78.472 kg)  BMI 22.20 kg/m2  SpO2 97% , BMI Body mass index is 22.2 kg/(m^2). GEN: Well nourished, well developed, in no acute distress HEENT: normal Neck: no JVD, carotid bruits, or masses Cardiac: RRR; no murmurs, rubs, or gallops,no edema  Respiratory:  clear to auscultation bilaterally, normal work of breathing GI: soft, nontender, nondistended, + BS MS: no deformity or atrophy Skin: warm and dry, no rash Neuro:  Strength and sensation are  intact Psych: euthymic mood, full affect   EKG:  EKG is not ordered today.    Recent Labs: 06/19/2014: BUN 17; Creatinine 1.0; Potassium 4.0; Sodium 139    Lipid Panel No results found for: CHOL, TRIG, HDL, CHOLHDL, VLDL, LDLCALC, LDLDIRECT    Wt Readings from Last 3 Encounters:  12/01/14 173 lb (78.472 kg)  06/04/14 178 lb (80.74 kg)  06/04/13 185 lb (83.915 kg)    ASSESSMENT AND PLAN:  1. Chronic atrial fibrillation rate controlled - continue digoxin/diltiazem/warfarin 2. HTN - elevated today - continue diltiazem/HCTZ/Lisinopril  - check BMET  - I have asked him to check his BP daily for a week and call with the results 3. Chronic systemic anticoagulation 4. RLE edema which has increased recently and his ankle is sore.  ? Whether this is orthopedic related.  Unlikely to be due to DVT since he is on warfarin but I will check a RLE venous doppler.  Continue HCTZ    Current medicines are reviewed at length with the patient today.  The patient does not have concerns regarding medicines.  The following changes have been made:  no change  Labs/ tests ordered today: See above Assessment and Plan No orders of the defined types were placed in this encounter.     Disposition:   FU with me in 6 months  Signed, Sueanne Margarita, MD  12/01/2014 9:17 AM    [facility] [address], [place], [place]  [address] Phone: [phone]; Fax: [phone]

## 2014-12-01 ENCOUNTER — Other Ambulatory Visit: Payer: Self-pay | Admitting: Family Medicine

## 2014-12-01 ENCOUNTER — Ambulatory Visit (INDEPENDENT_AMBULATORY_CARE_PROVIDER_SITE_OTHER): Payer: Medicare Other | Admitting: Cardiology

## 2014-12-01 ENCOUNTER — Encounter: Payer: Self-pay | Admitting: Cardiology

## 2014-12-01 ENCOUNTER — Ambulatory Visit
Admission: RE | Admit: 2014-12-01 | Discharge: 2014-12-01 | Disposition: A | Discharge status: Home / Self Care | Payer: Medicare Other | Source: Ambulatory Visit | Attending: Family Medicine | Admitting: Family Medicine

## 2014-12-01 ENCOUNTER — Ambulatory Visit (HOSPITAL_COMMUNITY): Payer: Medicare Other | Attending: Cardiovascular Disease

## 2014-12-01 VITALS — BP 148/80 | HR 60 | Ht 74.0 in | Wt 173.0 lb

## 2014-12-01 DIAGNOSIS — R609 Edema, unspecified: Secondary | ICD-10-CM | POA: Diagnosis not present

## 2014-12-01 DIAGNOSIS — R600 Localized edema: Secondary | ICD-10-CM | POA: Insufficient documentation

## 2014-12-01 DIAGNOSIS — I482 Chronic atrial fibrillation, unspecified: Secondary | ICD-10-CM

## 2014-12-01 DIAGNOSIS — M25471 Effusion, right ankle: Secondary | ICD-10-CM

## 2014-12-01 DIAGNOSIS — I10 Essential (primary) hypertension: Secondary | ICD-10-CM

## 2014-12-01 DIAGNOSIS — M109 Gout, unspecified: Secondary | ICD-10-CM | POA: Diagnosis not present

## 2014-12-01 DIAGNOSIS — M7731 Calcaneal spur, right foot: Secondary | ICD-10-CM | POA: Diagnosis not present

## 2014-12-01 DIAGNOSIS — Z7901 Long term (current) use of anticoagulants: Secondary | ICD-10-CM | POA: Diagnosis not present

## 2014-12-01 DIAGNOSIS — N400 Enlarged prostate without lower urinary tract symptoms: Secondary | ICD-10-CM | POA: Diagnosis not present

## 2014-12-01 DIAGNOSIS — M7989 Other specified soft tissue disorders: Secondary | ICD-10-CM | POA: Diagnosis not present

## 2014-12-01 LAB — BASIC METABOLIC PANEL
BUN: 24 mg/dL — AB (ref 6–23)
CHLORIDE: 106 meq/L (ref 96–112)
CO2: 28 mEq/L (ref 19–32)
Calcium: 9.1 mg/dL (ref 8.4–10.5)
Creatinine, Ser: 0.91 mg/dL (ref 0.40–1.50)
GFR: 83.6 mL/min (ref >60.00)
GLUCOSE: 96 mg/dL (ref 70–99)
POTASSIUM: 3.9 meq/L (ref 3.5–5.1)
Sodium: 140 mEq/L (ref 135–145)

## 2014-12-01 MED ORDER — DILTIAZEM HCL ER COATED BEADS 240 MG PO CP24: 240.0000 mg | ORAL_CAPSULE | Freq: Every day | ORAL | Status: DC

## 2014-12-01 MED ORDER — ATORVASTATIN CALCIUM 10 MG PO TABS: 10.0000 mg | ORAL_TABLET | Freq: Every day | ORAL | Status: DC

## 2014-12-01 NOTE — Addendum Note (Signed)
Addended by: Harland German A on: 12/01/2014 02:46 PM   Modules accepted: Orders

## 2014-12-01 NOTE — Patient Instructions (Addendum)
Medication Instructions:  Your physician recommends that you continue on your current medications as directed. Please refer to the Current Medication list given to you today.   Labwork: TODAY: BMET  Testing/Procedures: Dr. Radford Pax recommends you have a RIGHT LOWER EXTREMITY DOPPLER.  Follow-Up: Your physician wants you to follow-up in: 6 months with Dr. Radford Pax. You will receive a reminder letter in the mail two months in advance. If you don't receive a letter, please call our office to schedule the follow-up appointment.   Any Other Special Instructions Will Be Listed Below (If Applicable). Check your BLOOD PRESSURE daily for one week at lunch and call with results.

## 2014-12-01 NOTE — Addendum Note (Signed)
Addended by: Eulis Foster on: 12/01/2014 09:29 AM   Modules accepted: Orders

## 2014-12-01 NOTE — Addendum Note (Signed)
Addended by: Eulis Foster on: 12/01/2014 09:30 AM   Modules accepted: Orders

## 2014-12-02 ENCOUNTER — Other Ambulatory Visit: Payer: Self-pay | Admitting: Cardiology

## 2014-12-02 DIAGNOSIS — Z85820 Personal history of malignant melanoma of skin: Secondary | ICD-10-CM | POA: Diagnosis not present

## 2014-12-02 DIAGNOSIS — D0461 Carcinoma in situ of skin of right upper limb, including shoulder: Secondary | ICD-10-CM | POA: Diagnosis not present

## 2014-12-02 DIAGNOSIS — Z85828 Personal history of other malignant neoplasm of skin: Secondary | ICD-10-CM | POA: Diagnosis not present

## 2014-12-02 DIAGNOSIS — L719 Rosacea, unspecified: Secondary | ICD-10-CM | POA: Diagnosis not present

## 2014-12-02 DIAGNOSIS — D0439 Carcinoma in situ of skin of other parts of face: Secondary | ICD-10-CM | POA: Diagnosis not present

## 2014-12-02 DIAGNOSIS — D485 Neoplasm of uncertain behavior of skin: Secondary | ICD-10-CM | POA: Diagnosis not present

## 2014-12-03 DIAGNOSIS — H4011X1 Primary open-angle glaucoma, mild stage: Secondary | ICD-10-CM | POA: Diagnosis not present

## 2014-12-05 ENCOUNTER — Other Ambulatory Visit: Payer: Self-pay | Admitting: Cardiology

## 2014-12-16 DIAGNOSIS — R3912 Poor urinary stream: Secondary | ICD-10-CM | POA: Diagnosis not present

## 2014-12-16 DIAGNOSIS — N138 Other obstructive and reflux uropathy: Secondary | ICD-10-CM | POA: Diagnosis not present

## 2014-12-16 DIAGNOSIS — N401 Enlarged prostate with lower urinary tract symptoms: Secondary | ICD-10-CM | POA: Diagnosis not present

## 2014-12-18 ENCOUNTER — Ambulatory Visit (INDEPENDENT_AMBULATORY_CARE_PROVIDER_SITE_OTHER): Payer: Medicare Other | Admitting: Podiatry

## 2014-12-18 ENCOUNTER — Encounter: Payer: Self-pay | Admitting: Podiatry

## 2014-12-18 ENCOUNTER — Telehealth: Payer: Self-pay | Admitting: Cardiology

## 2014-12-18 ENCOUNTER — Ambulatory Visit: Payer: Medicare Other | Admitting: Podiatry

## 2014-12-18 DIAGNOSIS — M79609 Pain in unspecified limb: Secondary | ICD-10-CM

## 2014-12-18 DIAGNOSIS — M2042 Other hammer toe(s) (acquired), left foot: Secondary | ICD-10-CM

## 2014-12-18 DIAGNOSIS — M2041 Other hammer toe(s) (acquired), right foot: Secondary | ICD-10-CM

## 2014-12-18 DIAGNOSIS — M79672 Pain in left foot: Secondary | ICD-10-CM

## 2014-12-18 DIAGNOSIS — B351 Tinea unguium: Secondary | ICD-10-CM

## 2014-12-18 NOTE — Telephone Encounter (Signed)
New message      Pt c/o BP issue: STAT if pt c/o blurred vision, one-sided weakness or slurred speech  1. What are your last 5 BP readings? 131/64 HR 67;  139/73 HR 66,  137/77 HR 60,  123/67 HR 56, 6-4 134/55 HR 79,  131/57 HR 74,  133/65 HR 68, 134/65 HR 69, 139/73 HR 66, 137/79 HR 58, 127/65 HR 62, 135/68 HR 67  2. Are you having any other symptoms (ex. Dizziness, headache, blurred vision, passed out)?  3. What is your BP issue?  Calling to give bp readings

## 2014-12-18 NOTE — Telephone Encounter (Signed)
To Dr. Turner for review. 

## 2014-12-19 NOTE — Progress Notes (Signed)
Patient ID: Isaac Villa, male   DOB: [birth year], 81 y.o.   MRN: [id number] Complaint:  Visit Type: Patient returns to my office for continued preventative foot care services. Complaint: Patient states" my nails have grown long and thick and become painful to walk and wear shoes" . Painful corn fourth tow right foot.Marland Kitchen He presents for preventative foot care services. No changes to ROS  Podiatric Exam: Vascular: dorsalis pedis and posterior tibial pulses are palpable bilateral. Capillary return is immediate. Temperature gradient is WNL. Skin turgor WNL  Sensorium: Normal Semmes Weinstein monofilament test. Normal tactile sensation bilaterally. Nail Exam: Pt has thick disfigured discolored nails with subungual debris noted bilateral entire nail hallux .,,  Ulcer Exam: There is no evidence of ulcer or pre-ulcerative changes or infection. Orthopedic Exam: Muscle tone and strength are WNL. No limitations in general ROM. No crepitus or effusions noted. Foot type and digits show no abnormalities. Bony prominences are unremarkable. He has hammer toes 2-4 B/L. Skin: No Porokeratosis. No infection or ulcers, heloma durum fourth toe right foot  Diagnosis:  Tinea unguium, Pain in right toe, pain in left toes  Treatment & Plan Procedures and Treatment: Consent by patient was obtained for treatment procedures. The patient understood the discussion of treatment and procedures well. All questions were answered thoroughly reviewed. Debridement of mycotic and hypertrophic toenails, 1 through 5 bilateral and clearing of subungual debris. No ulceration, no infection noted.  Return Visit-Office Procedure: Patient instructed to return to the office for a follow up visit 3 months for continued evaluation and treatment.

## 2014-12-19 NOTE — Telephone Encounter (Signed)
Good BP - continue current meds

## 2014-12-21 NOTE — Telephone Encounter (Signed)
Informed patient of results and verbal understanding expressed.  

## 2014-12-23 ENCOUNTER — Other Ambulatory Visit: Payer: Self-pay | Admitting: *Deleted

## 2014-12-23 MED ORDER — LISINOPRIL 10 MG PO TABS: 10.0000 mg | ORAL_TABLET | Freq: Every day | ORAL | Status: DC

## 2014-12-24 DIAGNOSIS — I10 Essential (primary) hypertension: Secondary | ICD-10-CM | POA: Diagnosis not present

## 2014-12-24 DIAGNOSIS — E785 Hyperlipidemia, unspecified: Secondary | ICD-10-CM | POA: Diagnosis not present

## 2014-12-24 DIAGNOSIS — N400 Enlarged prostate without lower urinary tract symptoms: Secondary | ICD-10-CM | POA: Diagnosis not present

## 2014-12-24 DIAGNOSIS — I481 Persistent atrial fibrillation: Secondary | ICD-10-CM | POA: Diagnosis not present

## 2014-12-24 DIAGNOSIS — Z0001 Encounter for general adult medical examination with abnormal findings: Secondary | ICD-10-CM | POA: Diagnosis not present

## 2014-12-24 DIAGNOSIS — M109 Gout, unspecified: Secondary | ICD-10-CM | POA: Diagnosis not present

## 2014-12-24 DIAGNOSIS — Z7901 Long term (current) use of anticoagulants: Secondary | ICD-10-CM | POA: Diagnosis not present

## 2014-12-24 DIAGNOSIS — Z79899 Other long term (current) drug therapy: Secondary | ICD-10-CM | POA: Diagnosis not present

## 2014-12-31 DIAGNOSIS — Z7901 Long term (current) use of anticoagulants: Secondary | ICD-10-CM | POA: Diagnosis not present

## 2015-01-14 DIAGNOSIS — L905 Scar conditions and fibrosis of skin: Secondary | ICD-10-CM | POA: Diagnosis not present

## 2015-01-14 DIAGNOSIS — D0439 Carcinoma in situ of skin of other parts of face: Secondary | ICD-10-CM | POA: Diagnosis not present

## 2015-01-18 DIAGNOSIS — Z7901 Long term (current) use of anticoagulants: Secondary | ICD-10-CM | POA: Diagnosis not present

## 2015-01-29 ENCOUNTER — Ambulatory Visit: Payer: Medicare Other | Admitting: Podiatry

## 2015-02-02 ENCOUNTER — Ambulatory Visit: Payer: Medicare Other | Admitting: Podiatry

## 2015-02-02 DIAGNOSIS — Z7901 Long term (current) use of anticoagulants: Secondary | ICD-10-CM | POA: Diagnosis not present

## 2015-02-05 ENCOUNTER — Ambulatory Visit: Payer: Medicare Other | Admitting: Podiatry

## 2015-02-11 DIAGNOSIS — D485 Neoplasm of uncertain behavior of skin: Secondary | ICD-10-CM | POA: Diagnosis not present

## 2015-02-11 DIAGNOSIS — C44319 Basal cell carcinoma of skin of other parts of face: Secondary | ICD-10-CM | POA: Diagnosis not present

## 2015-02-16 DIAGNOSIS — Z7901 Long term (current) use of anticoagulants: Secondary | ICD-10-CM | POA: Diagnosis not present

## 2015-02-19 ENCOUNTER — Encounter: Payer: Self-pay | Admitting: Podiatry

## 2015-02-19 ENCOUNTER — Ambulatory Visit (INDEPENDENT_AMBULATORY_CARE_PROVIDER_SITE_OTHER): Payer: Medicare Other | Admitting: Podiatry

## 2015-02-19 VITALS — BP 136/69 | HR 69 | Resp 17

## 2015-02-19 DIAGNOSIS — M79676 Pain in unspecified toe(s): Secondary | ICD-10-CM | POA: Diagnosis not present

## 2015-02-19 DIAGNOSIS — B351 Tinea unguium: Secondary | ICD-10-CM

## 2015-02-19 NOTE — Progress Notes (Signed)
Patient ID: COAL NEARHOOD, male   DOB: Dec 23, 1924, 81 y.o.   MRN: [id number] Complaint:  Visit Type: Patient returns to my office for continued preventative foot care services. Complaint: Patient states" my nails have grown long and thick and become painful to walk and wear shoes" . The patient presents for preventative foot care services. No changes to ROS  Podiatric Exam: Vascular: dorsalis pedis and posterior tibial pulses are palpable bilateral. Capillary return is immediate. Temperature gradient is WNL. Skin turgor WNL  Sensorium: Normal Semmes Weinstein monofilament test. Normal tactile sensation bilaterally. Nail Exam: Pt has thick disfigured discolored nails with subungual debris noted bilateral entire nail hallux through fifth toenails Ulcer Exam: There is no evidence of ulcer or pre-ulcerative changes or infection. Orthopedic Exam: Muscle tone and strength are WNL. No limitations in general ROM. No crepitus or effusions noted. Foot type and digits show no abnormalities. Bony prominences are unremarkable. Hammer toes 2-5 B/L Skin: No Porokeratosis. No infection or ulcers  Diagnosis:  Onychomycosis, , Pain in right toe, pain in left toes  Treatment & Plan Procedures and Treatment: Consent by patient was obtained for treatment procedures. The patient understood the discussion of treatment and procedures well. All questions were answered thoroughly reviewed. Debridement of mycotic and hypertrophic toenails, 1 through 5 bilateral and clearing of subungual debris. No ulceration, no infection noted.  Return Visit-Office Procedure: Patient instructed to return to the office for a follow up visit 3 months for continued evaluation and treatment.

## 2015-03-05 DIAGNOSIS — Z7901 Long term (current) use of anticoagulants: Secondary | ICD-10-CM | POA: Diagnosis not present

## 2015-03-11 DIAGNOSIS — H4011X1 Primary open-angle glaucoma, mild stage: Secondary | ICD-10-CM | POA: Diagnosis not present

## 2015-03-22 DIAGNOSIS — D485 Neoplasm of uncertain behavior of skin: Secondary | ICD-10-CM | POA: Diagnosis not present

## 2015-03-22 DIAGNOSIS — C44319 Basal cell carcinoma of skin of other parts of face: Secondary | ICD-10-CM | POA: Diagnosis not present

## 2015-03-25 DIAGNOSIS — C44329 Squamous cell carcinoma of skin of other parts of face: Secondary | ICD-10-CM | POA: Diagnosis not present

## 2015-04-07 DIAGNOSIS — Z7901 Long term (current) use of anticoagulants: Secondary | ICD-10-CM | POA: Diagnosis not present

## 2015-04-15 DIAGNOSIS — Z7901 Long term (current) use of anticoagulants: Secondary | ICD-10-CM | POA: Diagnosis not present

## 2015-04-19 DIAGNOSIS — Z23 Encounter for immunization: Secondary | ICD-10-CM | POA: Diagnosis not present

## 2015-04-21 ENCOUNTER — Encounter: Payer: Self-pay | Admitting: Podiatry

## 2015-04-21 ENCOUNTER — Ambulatory Visit (INDEPENDENT_AMBULATORY_CARE_PROVIDER_SITE_OTHER): Payer: Medicare Other | Admitting: Podiatry

## 2015-04-21 DIAGNOSIS — M79609 Pain in unspecified limb: Secondary | ICD-10-CM

## 2015-04-21 DIAGNOSIS — M79673 Pain in unspecified foot: Secondary | ICD-10-CM | POA: Diagnosis not present

## 2015-04-21 DIAGNOSIS — B351 Tinea unguium: Secondary | ICD-10-CM | POA: Diagnosis not present

## 2015-04-21 NOTE — Progress Notes (Signed)
Patient ID: Isaac Villa, male   DOB: 07/21/1924, 81 y.o.   MRN: [id number] Complaint:  Visit Type: Patient returns to my office for continued preventative foot care services. Complaint: Patient states" my nails have grown long and thick and become painful to walk and wear shoes" . The patient presents for preventative foot care services. No changes to ROS  Podiatric Exam: Vascular: dorsalis pedis and posterior tibial pulses are palpable bilateral. Capillary return is immediate. Temperature gradient is WNL. Skin turgor WNL  Sensorium: Normal Semmes Weinstein monofilament test. Normal tactile sensation bilaterally. Nail Exam: Pt has thick disfigured discolored nails with subungual debris noted bilateral entire nail hallux through fifth toenails Ulcer Exam: There is no evidence of ulcer or pre-ulcerative changes or infection. Orthopedic Exam: Muscle tone and strength are WNL. No limitations in general ROM. No crepitus or effusions noted. Foot type and digits show no abnormalities. Bony prominences are unremarkable. Hammer toes 2-5 B/L Skin: No Porokeratosis. No infection or ulcers  Diagnosis:  Onychomycosis, , Pain in right toe, pain in left toes  Treatment & Plan Procedures and Treatment: Consent by patient was obtained for treatment procedures. The patient understood the discussion of treatment and procedures well. All questions were answered thoroughly reviewed. Debridement of mycotic and hypertrophic toenails, 1 through 5 bilateral and clearing of subungual debris. No ulceration, no infection noted.  Return Visit-Office Procedure: Patient instructed to return to the office for a follow up visit 3 months for continued evaluation and treatment. 

## 2015-04-26 ENCOUNTER — Other Ambulatory Visit (HOSPITAL_COMMUNITY): Payer: Self-pay | Admitting: Surgical

## 2015-04-26 ENCOUNTER — Ambulatory Visit (HOSPITAL_COMMUNITY)
Admission: RE | Admit: 2015-04-26 | Discharge: 2015-04-26 | Disposition: A | Discharge status: Home / Self Care | Payer: Medicare Other | Source: Ambulatory Visit | Attending: Cardiovascular Disease | Admitting: Cardiovascular Disease

## 2015-04-26 ENCOUNTER — Other Ambulatory Visit (HOSPITAL_COMMUNITY): Payer: Self-pay | Admitting: Family Medicine

## 2015-04-26 DIAGNOSIS — M7989 Other specified soft tissue disorders: Secondary | ICD-10-CM | POA: Insufficient documentation

## 2015-04-26 DIAGNOSIS — M79605 Pain in left leg: Secondary | ICD-10-CM | POA: Insufficient documentation

## 2015-04-26 DIAGNOSIS — Z96652 Presence of left artificial knee joint: Secondary | ICD-10-CM | POA: Diagnosis not present

## 2015-04-26 DIAGNOSIS — I10 Essential (primary) hypertension: Secondary | ICD-10-CM | POA: Insufficient documentation

## 2015-04-26 DIAGNOSIS — M79662 Pain in left lower leg: Secondary | ICD-10-CM | POA: Diagnosis not present

## 2015-04-26 DIAGNOSIS — Z471 Aftercare following joint replacement surgery: Secondary | ICD-10-CM | POA: Diagnosis not present

## 2015-04-27 DIAGNOSIS — Z7901 Long term (current) use of anticoagulants: Secondary | ICD-10-CM | POA: Diagnosis not present

## 2015-04-27 DIAGNOSIS — S8392XA Sprain of unspecified site of left knee, initial encounter: Secondary | ICD-10-CM | POA: Diagnosis not present

## 2015-04-30 DIAGNOSIS — Z7901 Long term (current) use of anticoagulants: Secondary | ICD-10-CM | POA: Diagnosis not present

## 2015-05-03 DIAGNOSIS — S8012XA Contusion of left lower leg, initial encounter: Secondary | ICD-10-CM | POA: Diagnosis not present

## 2015-05-03 DIAGNOSIS — Z7901 Long term (current) use of anticoagulants: Secondary | ICD-10-CM | POA: Diagnosis not present

## 2015-05-03 DIAGNOSIS — I481 Persistent atrial fibrillation: Secondary | ICD-10-CM | POA: Diagnosis not present

## 2015-05-04 ENCOUNTER — Emergency Department (HOSPITAL_COMMUNITY)
Admission: EM | Admit: 2015-05-04 | Discharge: 2015-05-04 | Disposition: A | Discharge status: Home / Self Care | Payer: Medicare Other | Attending: Emergency Medicine | Admitting: Emergency Medicine

## 2015-05-04 ENCOUNTER — Encounter (HOSPITAL_COMMUNITY): Payer: Self-pay | Admitting: *Deleted

## 2015-05-04 DIAGNOSIS — Z8719 Personal history of other diseases of the digestive system: Secondary | ICD-10-CM | POA: Insufficient documentation

## 2015-05-04 DIAGNOSIS — D689 Coagulation defect, unspecified: Secondary | ICD-10-CM | POA: Diagnosis present

## 2015-05-04 DIAGNOSIS — Z85828 Personal history of other malignant neoplasm of skin: Secondary | ICD-10-CM | POA: Insufficient documentation

## 2015-05-04 DIAGNOSIS — I10 Essential (primary) hypertension: Secondary | ICD-10-CM | POA: Diagnosis not present

## 2015-05-04 DIAGNOSIS — Z87891 Personal history of nicotine dependence: Secondary | ICD-10-CM | POA: Diagnosis not present

## 2015-05-04 DIAGNOSIS — M109 Gout, unspecified: Secondary | ICD-10-CM | POA: Insufficient documentation

## 2015-05-04 DIAGNOSIS — Z880 Allergy status to penicillin: Secondary | ICD-10-CM | POA: Diagnosis not present

## 2015-05-04 DIAGNOSIS — H409 Unspecified glaucoma: Secondary | ICD-10-CM | POA: Diagnosis not present

## 2015-05-04 DIAGNOSIS — Z7901 Long term (current) use of anticoagulants: Secondary | ICD-10-CM | POA: Diagnosis not present

## 2015-05-04 DIAGNOSIS — E7800 Pure hypercholesterolemia, unspecified: Secondary | ICD-10-CM | POA: Diagnosis not present

## 2015-05-04 DIAGNOSIS — I4891 Unspecified atrial fibrillation: Secondary | ICD-10-CM | POA: Diagnosis not present

## 2015-05-04 DIAGNOSIS — S7012XA Contusion of left thigh, initial encounter: Secondary | ICD-10-CM | POA: Diagnosis not present

## 2015-05-04 DIAGNOSIS — R42 Dizziness and giddiness: Secondary | ICD-10-CM | POA: Diagnosis not present

## 2015-05-04 DIAGNOSIS — S8012XA Contusion of left lower leg, initial encounter: Secondary | ICD-10-CM | POA: Diagnosis not present

## 2015-05-04 DIAGNOSIS — T148XXA Other injury of unspecified body region, initial encounter: Secondary | ICD-10-CM

## 2015-05-04 DIAGNOSIS — Z87438 Personal history of other diseases of male genital organs: Secondary | ICD-10-CM | POA: Insufficient documentation

## 2015-05-04 DIAGNOSIS — Z79899 Other long term (current) drug therapy: Secondary | ICD-10-CM | POA: Diagnosis not present

## 2015-05-04 DIAGNOSIS — D649 Anemia, unspecified: Secondary | ICD-10-CM | POA: Diagnosis not present

## 2015-05-04 LAB — CBC
HEMATOCRIT: 30.5 % — AB (ref 39.0–52.0)
Hemoglobin: 9.9 g/dL — ABNORMAL LOW (ref 13.0–17.0)
MCH: 30.6 pg (ref 26.0–34.0)
MCHC: 32.5 g/dL (ref 30.0–36.0)
MCV: 94.1 fL (ref 78.0–100.0)
PLATELETS: 167 10*3/uL (ref 150–400)
RBC: 3.24 MIL/uL — AB (ref 4.22–5.81)
RDW: 16.4 % — ABNORMAL HIGH (ref 11.5–15.5)
WBC: 11.9 10*3/uL — ABNORMAL HIGH (ref 4.0–10.5)

## 2015-05-04 LAB — PROTIME-INR
INR: 1.64 — ABNORMAL HIGH (ref 0.00–1.49)
Prothrombin Time: 19.4 seconds — ABNORMAL HIGH (ref 11.6–15.2)

## 2015-05-04 LAB — BASIC METABOLIC PANEL
ANION GAP: 7 (ref 5–15)
BUN: 32 mg/dL — AB (ref 6–20)
CHLORIDE: 106 mmol/L (ref 101–111)
CO2: 27 mmol/L (ref 22–32)
CREATININE: 1.1 mg/dL (ref 0.61–1.24)
Calcium: 8.8 mg/dL — ABNORMAL LOW (ref 8.9–10.3)
GFR calc Af Amer: >60 mL/min (ref >60)
GFR calc non Af Amer: 58 mL/min — ABNORMAL LOW (ref >60)
Glucose, Bld: 99 mg/dL (ref 65–99)
Potassium: 4.2 mmol/L (ref 3.5–5.1)
SODIUM: 140 mmol/L (ref 135–145)

## 2015-05-04 MED ORDER — SODIUM CHLORIDE 0.9 % IV BOLUS (SEPSIS): 1000.0000 mL | Freq: Once | INTRAVENOUS | Status: DC

## 2015-05-04 NOTE — ED Notes (Signed)
Pt stable, ambulatory, states understanding of discharge instructions 

## 2015-05-04 NOTE — ED Provider Notes (Signed)
CSN: [id number]     Arrival date & time 05/04/15  1212 History   First MD Initiated Contact with Patient 05/04/15 1244     Chief Complaint  Patient presents with  . Coagulation Disorder   HPI   81 year old male presents today at the request of his primary care provider for evaluation of bruising. Patient has a past medical history significant for A. fib for which she's taking Coumadin, he's been taking this for the last 15 years. 4 days ago he hit the lateral aspect of his left lower extremity on a chair, this causes significant pain with resulting bruising to the left extremity. He reports that over the next several days bruising all the way down the back lateral and medial aspects of the lower extremity with swelling of the extremity persisted. Patient was seen by his orthopedist Dr. Reynaldo Minium who performed x-rays and Doppler ultrasound to rule out DVT. Patient was seen by his primary care provider who ran basic labs, he was found to have an INR of 4.0 on Friday, discontinuation of Coumadin, with repeat INR on Saturday showing INR of 2.9, Monday INR of 2.5. Patient was seen this morning by his primary care with a normal INR, but was found to have a hemoglobin of 9.0, which had fallen from 10 point to the prior day. Due to fall in hemoglobin he was instructed to follow-up into the emergency room for further evaluation and management. At this time my evaluation patient reports that he has pain to the left lower extremity, denies loss of sensation strength or motor function. He does report difficulty with ambulation due to the pain. Patient denies any continued bruising or swelling over the last several days. He reports that he gets dizzy when going from sitting to standing, denies any chest pain or shortness of breath.   Past Medical History  Diagnosis Date  . Melanoma ([place])   . Herniated disc   . Hernia   . Glaucoma   . Gout   . Atrial fibrillation ([facility])     Chronic  . Elevated cholesterol   .  Hypertension   . BPH (benign prostatic hypertrophy)   . Thrombocytopenia ([place])   . Melanoma ([place])   . Edema extremities 06/04/2014   Past Surgical History  Procedure Laterality Date  . Appendectomy    . Eye surgery      lens replacements  . Joint replacement      left knee  . Hernia repair    . Lumbar disc surgery  07/2004  . Tonsillectomy    . Replacement total knee Left 11/2006  . Cataract extraction Bilateral 04/2008   Family History  Problem Relation Age of Onset  . Hypertension Mother   . Prostate cancer Father    Social History  Substance Use Topics  . Smoking status: Former Research scientist (life sciences)  . Smokeless tobacco: None  . Alcohol Use: Yes     Comment: 3-4 glasses of wine per week    Review of Systems  All other systems reviewed and are negative.   Allergies  Lovenox and Penicillins  Home Medications   Prior to Admission medications   Medication Sig Start Date End Date Taking? Authorizing Provider  allopurinol (ZYLOPRIM) 300 MG tablet Take 300 mg by mouth daily.   Yes Historical Provider, MD  atorvastatin (LIPITOR) 10 MG tablet Take 1 tablet (10 mg total) by mouth daily. 12/01/14  Yes Sueanne Margarita, MD  Calcium Carb-Cholecalciferol (CALCIUM + D3) 600-200 MG-UNIT TABS Take 1  tablet by mouth daily.   Yes Historical Provider, MD  digoxin (LANOXIN) 0.125 MG tablet Take 0.125 mg by mouth daily.   Yes Historical Provider, MD  diltiazem (CARDIZEM CD) 240 MG 24 hr capsule Take 1 capsule (240 mg total) by mouth daily. 12/01/14  Yes Sueanne Margarita, MD  finasteride (PROSCAR) 5 MG tablet Take 5 mg by mouth daily.   Yes Historical Provider, MD  hydrochlorothiazide (HYDRODIURIL) 25 MG tablet Take 25 mg by mouth daily.   Yes Historical Provider, MD  KLOR-CON M20 20 MEQ tablet Take 20 mEq by mouth 2 (two) times daily. 02/13/15  Yes Historical Provider, MD  latanoprost (XALATAN) 0.005 % ophthalmic solution 1 drop at bedtime. 1 drop into infected eye in the evening   Yes Historical Provider,  MD  lisinopril (PRINIVIL,ZESTRIL) 10 MG tablet Take 1 tablet (10 mg total) by mouth daily. 12/23/14  Yes Sueanne Margarita, MD  Magnesium 400 MG CAPS Take 1 tablet by mouth daily.    Yes Historical Provider, MD  Multiple Vitamin (MULTIVITAMIN) tablet Take 1 tablet by mouth daily.   Yes Historical Provider, MD  Omega-3 Fatty Acids (FISH OIL) 1000 MG CAPS Take 1 capsule by mouth daily.   Yes Historical Provider, MD  Potassium Chloride ER 20 MEQ TBCR Take 20 mEq by mouth 2 (two) times daily. Patient taking differently: Take 20 mEq by mouth daily.  06/05/14  Yes Sueanne Margarita, MD  tamsulosin (FLOMAX) 0.4 MG CAPS capsule Take 0.4 mg by mouth daily.   Yes Historical Provider, MD  warfarin (COUMADIN) 5 MG tablet Take 5 mg by mouth daily. As directed by physician   Yes Historical Provider, MD   BP 124/57 mmHg  Pulse 63  Temp(Src) 98.6 F (37 C) (Oral)  Resp 14  SpO2 99%   Physical Exam  Constitutional: He is oriented to person, place, and time. He appears well-developed and well-nourished.  HENT:  Head: Normocephalic and atraumatic.  Eyes: Conjunctivae are normal. Pupils are equal, round, and reactive to light. Right eye exhibits no discharge. Left eye exhibits no discharge. No scleral icterus.  Neck: Normal range of motion. No JVD present. No tracheal deviation present.  Cardiovascular:  Pedal Pulses 2+  Pulmonary/Chest: Effort normal. No stridor.  Musculoskeletal:  Posterior, Medial, lateral aspects of the left lower extremity diffusely, swelling noted to the extremity. Patient has flexion of both hip and knee. Pedal pulses 2+, Refill less than 3 seconds  Neurological: He is alert and oriented to person, place, and time. Coordination normal.  Psychiatric: He has a normal mood and affect. His behavior is normal. Judgment and thought content normal.  Nursing note and vitals reviewed.   ED Course  Procedures (including critical care time) Labs Review Labs Reviewed  BASIC METABOLIC PANEL -  Abnormal; Notable for the following:    BUN 32 (*)    Calcium 8.8 (*)    GFR calc non Af Amer 58 (*)    All other components within normal limits  CBC - Abnormal; Notable for the following:    WBC 11.9 (*)    RBC 3.24 (*)    Hemoglobin 9.9 (*)    HCT 30.5 (*)    RDW 16.4 (*)    All other components within normal limits  PROTIME-INR - Abnormal; Notable for the following:    Prothrombin Time 19.4 (*)    INR 1.64 (*)    All other components within normal limits    Imaging Review No results found. I  have personally reviewed and evaluated these images and lab results as part of my medical decision-making.   EKG Interpretation None      MDM   Final diagnoses:  Anemia, unspecified anemia type  Hematoma    Labs: CBC, PT/INR, BMP- hemoglobin 9.9, NR 1.64  Imaging:  Consults:  Therapeutics:  Discharge Meds:    Assessment/Plan: Patient presents at the request of his primary care for evaluation in the ED setting. Patient had significant bruising in bleeding to the left lower extremity, Coumadin was discontinued down to close to subtherapeutic levels, with subsequent CBC showing slow decline. CBC here in the ED shows an improvement at 9.9 from 9.6 this morning. This injury happened several days ago, if bleeding was still progressing, is doing so slow rate, although today's labs show an improvement. Patient denies any worsening swelling of the lower extremity, he had a Doppler ultrasound for this that showed no signs of DVT. A question about dizziness patient reports that he is "always dizzy" and refused any normal saline here in the ED. The patient will be instructed to contact his primary care provider today and inform them of today's visit and all relevant data, he is encouraged to follow up tomorrow morning for repeat CBC and further evaluation and management at that time. Patient experiences any significant changes in his symptoms he is to return to the emergency room immediately.  The patient, his daughter, his wife all understood and agreed to today's plan and had no further questions or concerns at the time of discharge. Patient is able to ambulate with the use of a cane.  Patient care was shared with Dr. Billy Fischer MD who personally evaluated the patient and agreed to my assessment and plan         Okey Regal, PA-C 05/04/15 2107  Gareth Morgan, MD 05/04/15 2150

## 2015-05-04 NOTE — ED Notes (Signed)
Provider at bedside

## 2015-05-04 NOTE — ED Notes (Addendum)
Pt has had bleeding to left leg and thigh and having some inside bleeding when twisting and hit left hip on arm of chair.  Pt sent here for blood transfusion because he had drop in hemoglobin.  Pt had been off coumadin since Saturday when his first inr was 4.2, 5.2, and now 1.7. Reports dizziness

## 2015-05-04 NOTE — ED Notes (Signed)
Currently patient refusing IV at this time.

## 2015-05-04 NOTE — Discharge Instructions (Signed)
Please contact her primary care provider today and inform them today's visit and all relevant data. Please schedule follow-up evaluation for repeat labs and further evaluation. If new worsening signs or symptoms present please return to the emergency room immediately for further evaluation and management.

## 2015-05-05 DIAGNOSIS — I481 Persistent atrial fibrillation: Secondary | ICD-10-CM | POA: Diagnosis not present

## 2015-05-05 DIAGNOSIS — S8010XA Contusion of unspecified lower leg, initial encounter: Secondary | ICD-10-CM | POA: Diagnosis not present

## 2015-05-05 DIAGNOSIS — D649 Anemia, unspecified: Secondary | ICD-10-CM | POA: Diagnosis not present

## 2015-05-05 DIAGNOSIS — Z7901 Long term (current) use of anticoagulants: Secondary | ICD-10-CM | POA: Diagnosis not present

## 2015-05-07 ENCOUNTER — Telehealth: Payer: Self-pay | Admitting: Cardiology

## 2015-05-07 NOTE — Telephone Encounter (Signed)
Spoke with patient's wife and he has been seeing his PCP for the leg issue and has been to the ER.  He has a follow up appointment on Mon with PCP and I have advised her to contact the PCP today to further address.  She really wants Dr Radford Pax opinion about it.  I let her know that she does not have an opening until Jan for a routine follow up and that is when his wife says he is due.  I let her know I would forward to Dr Theodosia Blender nurse Valetta Fuller to see if she feels it is necessary to come in early for Dr Radford Pax to evaluate.  I strongly encouraged her to call Dr Raliegh Ip back today to let them know he is not getting any better.  Still unable to bear weight on leg.  He has been seen PCP, Dr Maureen Ralphs, and ER MD.  She is aware Valetta Fuller is not back until Tues and Dr Radford Pax not in office until later in week

## 2015-05-07 NOTE — Telephone Encounter (Signed)
NewMessage  Pt wife calling to speak w/ RN concerning pt's recent swelling- has been seen @ [facility]- dx for edema  Pt c/o swelling: STAT is pt has developed SOB within 24 hours  1. How long have you been experiencing swelling?  10 days  2. Where is the swelling located? Lft leg-= entire - black and blue from groin to ankle  3.  Are you currently taking a "fluid pill"? No   4.  Are you currently SOB? No, very fatigue- can not put weight on leg  5.  Have you traveled recently?October to [place]

## 2015-05-10 DIAGNOSIS — D649 Anemia, unspecified: Secondary | ICD-10-CM | POA: Diagnosis not present

## 2015-05-10 DIAGNOSIS — Z7901 Long term (current) use of anticoagulants: Secondary | ICD-10-CM | POA: Diagnosis not present

## 2015-05-10 DIAGNOSIS — L98499 Non-pressure chronic ulcer of skin of other sites with unspecified severity: Secondary | ICD-10-CM | POA: Diagnosis not present

## 2015-05-10 DIAGNOSIS — D509 Iron deficiency anemia, unspecified: Secondary | ICD-10-CM | POA: Diagnosis not present

## 2015-05-11 NOTE — Telephone Encounter (Signed)
Patient's wife st his legs are much better than when they called the office last week and then saw Dr. Dorthy Cooler. She does not report any medication changes.  Scheduled first available OV with Dr. Radford Pax in January, and instructed patient's wife to call if symptoms return or do not subside. She agrees with treatment plan and is grateful for callback.

## 2015-05-13 DIAGNOSIS — Z7901 Long term (current) use of anticoagulants: Secondary | ICD-10-CM | POA: Diagnosis not present

## 2015-05-17 DIAGNOSIS — Z7901 Long term (current) use of anticoagulants: Secondary | ICD-10-CM | POA: Diagnosis not present

## 2015-05-20 DIAGNOSIS — Z7901 Long term (current) use of anticoagulants: Secondary | ICD-10-CM | POA: Diagnosis not present

## 2015-05-25 DIAGNOSIS — Z7901 Long term (current) use of anticoagulants: Secondary | ICD-10-CM | POA: Diagnosis not present

## 2015-06-01 DIAGNOSIS — Z7901 Long term (current) use of anticoagulants: Secondary | ICD-10-CM | POA: Diagnosis not present

## 2015-06-09 DIAGNOSIS — D1801 Hemangioma of skin and subcutaneous tissue: Secondary | ICD-10-CM | POA: Diagnosis not present

## 2015-06-09 DIAGNOSIS — D225 Melanocytic nevi of trunk: Secondary | ICD-10-CM | POA: Diagnosis not present

## 2015-06-09 DIAGNOSIS — L821 Other seborrheic keratosis: Secondary | ICD-10-CM | POA: Diagnosis not present

## 2015-06-09 DIAGNOSIS — Z85820 Personal history of malignant melanoma of skin: Secondary | ICD-10-CM | POA: Diagnosis not present

## 2015-06-09 DIAGNOSIS — D485 Neoplasm of uncertain behavior of skin: Secondary | ICD-10-CM | POA: Diagnosis not present

## 2015-06-09 DIAGNOSIS — D0422 Carcinoma in situ of skin of left ear and external auricular canal: Secondary | ICD-10-CM | POA: Diagnosis not present

## 2015-06-09 DIAGNOSIS — Z85828 Personal history of other malignant neoplasm of skin: Secondary | ICD-10-CM | POA: Diagnosis not present

## 2015-06-10 DIAGNOSIS — Z7901 Long term (current) use of anticoagulants: Secondary | ICD-10-CM | POA: Diagnosis not present

## 2015-06-10 DIAGNOSIS — H401131 Primary open-angle glaucoma, bilateral, mild stage: Secondary | ICD-10-CM | POA: Diagnosis not present

## 2015-06-10 DIAGNOSIS — D509 Iron deficiency anemia, unspecified: Secondary | ICD-10-CM | POA: Diagnosis not present

## 2015-06-10 DIAGNOSIS — Z961 Presence of intraocular lens: Secondary | ICD-10-CM | POA: Diagnosis not present

## 2015-06-16 DIAGNOSIS — Z7901 Long term (current) use of anticoagulants: Secondary | ICD-10-CM | POA: Diagnosis not present

## 2015-06-21 DIAGNOSIS — C44229 Squamous cell carcinoma of skin of left ear and external auricular canal: Secondary | ICD-10-CM | POA: Diagnosis not present

## 2015-06-21 DIAGNOSIS — Z7901 Long term (current) use of anticoagulants: Secondary | ICD-10-CM | POA: Diagnosis not present

## 2015-06-21 DIAGNOSIS — Z85820 Personal history of malignant melanoma of skin: Secondary | ICD-10-CM | POA: Diagnosis not present

## 2015-06-21 DIAGNOSIS — D1801 Hemangioma of skin and subcutaneous tissue: Secondary | ICD-10-CM | POA: Diagnosis not present

## 2015-06-21 DIAGNOSIS — C44329 Squamous cell carcinoma of skin of other parts of face: Secondary | ICD-10-CM | POA: Diagnosis not present

## 2015-06-21 DIAGNOSIS — L905 Scar conditions and fibrosis of skin: Secondary | ICD-10-CM | POA: Diagnosis not present

## 2015-06-24 ENCOUNTER — Encounter: Payer: Self-pay | Admitting: Podiatry

## 2015-06-24 ENCOUNTER — Ambulatory Visit (INDEPENDENT_AMBULATORY_CARE_PROVIDER_SITE_OTHER): Payer: Medicare Other | Admitting: Podiatry

## 2015-06-24 DIAGNOSIS — M79609 Pain in unspecified limb: Principal | ICD-10-CM

## 2015-06-24 DIAGNOSIS — B351 Tinea unguium: Secondary | ICD-10-CM

## 2015-06-24 DIAGNOSIS — M79673 Pain in unspecified foot: Secondary | ICD-10-CM | POA: Diagnosis not present

## 2015-06-24 NOTE — Progress Notes (Signed)
Patient ID: Isaac Villa, male   DOB: [birth year], 81 y.o.   MRN: [id number] Complaint:  Visit Type: Patient returns to my office for continued preventative foot care services. Complaint: Patient states" my nails have grown long and thick and become painful to walk and wear shoes" . The patient presents for preventative foot care services. No changes to ROS  Podiatric Exam: Vascular: dorsalis pedis and posterior tibial pulses are palpable bilateral. Capillary return is immediate. Temperature gradient is WNL. Skin turgor WNL  Sensorium: Normal Semmes Weinstein monofilament test. Normal tactile sensation bilaterally. Nail Exam: Pt has thick disfigured discolored nails with subungual debris noted bilateral entire nail hallux through fifth toenails Ulcer Exam: There is no evidence of ulcer or pre-ulcerative changes or infection. Orthopedic Exam: Muscle tone and strength are WNL. No limitations in general ROM. No crepitus or effusions noted. Foot type and digits show no abnormalities. Bony prominences are unremarkable. Hammer toes 2-5 B/L Skin: No Porokeratosis. No infection or ulcers  Diagnosis:  Onychomycosis, , Pain in right toe, pain in left toes  Treatment & Plan Procedures and Treatment: Consent by patient was obtained for treatment procedures. The patient understood the discussion of treatment and procedures well. All questions were answered thoroughly reviewed. Debridement of mycotic and hypertrophic toenails, 1 through 5 bilateral and clearing of subungual debris. No ulceration, no infection noted.  Return Visit-Office Procedure: Patient instructed to return to the office for a follow up visit 3 months for continued evaluation and treatment.   Gardiner Barefoot DPM

## 2015-06-25 DIAGNOSIS — Z7901 Long term (current) use of anticoagulants: Secondary | ICD-10-CM | POA: Diagnosis not present

## 2015-06-30 DIAGNOSIS — Z7901 Long term (current) use of anticoagulants: Secondary | ICD-10-CM | POA: Diagnosis not present

## 2015-07-02 DIAGNOSIS — D509 Iron deficiency anemia, unspecified: Secondary | ICD-10-CM | POA: Diagnosis not present

## 2015-07-02 DIAGNOSIS — I10 Essential (primary) hypertension: Secondary | ICD-10-CM | POA: Diagnosis not present

## 2015-07-02 DIAGNOSIS — S39012A Strain of muscle, fascia and tendon of lower back, initial encounter: Secondary | ICD-10-CM | POA: Diagnosis not present

## 2015-07-02 DIAGNOSIS — I481 Persistent atrial fibrillation: Secondary | ICD-10-CM | POA: Diagnosis not present

## 2015-07-07 DIAGNOSIS — Z7901 Long term (current) use of anticoagulants: Secondary | ICD-10-CM | POA: Diagnosis not present

## 2015-07-12 ENCOUNTER — Ambulatory Visit: Payer: Medicare Other | Admitting: Cardiology

## 2015-07-13 DIAGNOSIS — Z7901 Long term (current) use of anticoagulants: Secondary | ICD-10-CM | POA: Diagnosis not present

## 2015-07-20 DIAGNOSIS — Z7901 Long term (current) use of anticoagulants: Secondary | ICD-10-CM | POA: Diagnosis not present

## 2015-07-23 DIAGNOSIS — Z7901 Long term (current) use of anticoagulants: Secondary | ICD-10-CM | POA: Diagnosis not present

## 2015-08-02 DIAGNOSIS — Z7901 Long term (current) use of anticoagulants: Secondary | ICD-10-CM | POA: Diagnosis not present

## 2015-08-11 DIAGNOSIS — Z7901 Long term (current) use of anticoagulants: Secondary | ICD-10-CM | POA: Diagnosis not present

## 2015-08-18 DIAGNOSIS — Z7901 Long term (current) use of anticoagulants: Secondary | ICD-10-CM | POA: Diagnosis not present

## 2015-08-28 ENCOUNTER — Other Ambulatory Visit: Payer: Self-pay | Admitting: Cardiology

## 2015-08-31 ENCOUNTER — Encounter: Payer: Self-pay | Admitting: Cardiology

## 2015-08-31 ENCOUNTER — Ambulatory Visit (INDEPENDENT_AMBULATORY_CARE_PROVIDER_SITE_OTHER): Payer: Medicare Other | Admitting: Cardiology

## 2015-08-31 VITALS — BP 132/62 | HR 72 | Ht 73.0 in | Wt 170.8 lb

## 2015-08-31 DIAGNOSIS — Z7901 Long term (current) use of anticoagulants: Secondary | ICD-10-CM

## 2015-08-31 DIAGNOSIS — R600 Localized edema: Secondary | ICD-10-CM

## 2015-08-31 DIAGNOSIS — R609 Edema, unspecified: Secondary | ICD-10-CM

## 2015-08-31 DIAGNOSIS — I482 Chronic atrial fibrillation, unspecified: Secondary | ICD-10-CM

## 2015-08-31 DIAGNOSIS — I4891 Unspecified atrial fibrillation: Secondary | ICD-10-CM | POA: Diagnosis not present

## 2015-08-31 DIAGNOSIS — I10 Essential (primary) hypertension: Secondary | ICD-10-CM

## 2015-08-31 DIAGNOSIS — Z5181 Encounter for therapeutic drug level monitoring: Secondary | ICD-10-CM | POA: Diagnosis not present

## 2015-08-31 LAB — BASIC METABOLIC PANEL
BUN: 22 mg/dL (ref 7–25)
CALCIUM: 9.1 mg/dL (ref 8.6–10.3)
CO2: 26 mmol/L (ref 20–31)
CREATININE: 0.99 mg/dL (ref 0.70–1.11)
Chloride: 106 mmol/L (ref 98–110)
Glucose, Bld: 92 mg/dL (ref 65–99)
Potassium: 4.3 mmol/L (ref 3.5–5.3)
Sodium: 141 mmol/L (ref 135–146)

## 2015-08-31 NOTE — Patient Instructions (Signed)
Medication Instructions:  Your physician recommends that you continue on your current medications as directed. Please refer to the Current Medication list given to you today.   Labwork: TODAY: BMET, DIGOXIN  Testing/Procedures: None  Follow-Up: Your physician wants you to follow-up in: 1 year with Dr. Radford Pax. You will receive a reminder letter in the mail two months in advance. If you don't receive a letter, please call our office to schedule the follow-up appointment.   Any Other Special Instructions Will Be Listed Below (If Applicable).     If you need a refill on your cardiac medications before your next appointment, please call your pharmacy.

## 2015-08-31 NOTE — Progress Notes (Signed)
Cardiology Office Note   Date:  08/31/2015   ID:  Isaac Villa, DOB 14-Jan-1925, MRN [id number]  PCP:  Isaac Amel, MD    No chief complaint on file.     History of Present Illness: Isaac Villa is a 82 y.o. male with a history of chronic atrial fibrillation on systemic anticoagulation and HTN who presents today for followup. He is doing well. He denies any chest pain, SOB, DOE, dizziness, palpitations or syncope. His LE edema has resolved.  Past Medical History  Diagnosis Date  . Melanoma ([place])   . Herniated disc   . Hernia   . Glaucoma   . Gout   . Atrial fibrillation ([facility])     Chronic  . Elevated cholesterol   . Hypertension   . BPH (benign prostatic hypertrophy)   . Thrombocytopenia ([place])   . Melanoma ([place])   . Edema extremities 06/04/2014    Past Surgical History  Procedure Laterality Date  . Appendectomy    . Eye surgery      lens replacements  . Joint replacement      left knee  . Hernia repair    . Lumbar disc surgery  07/2004  . Tonsillectomy    . Replacement total knee Left 11/2006  . Cataract extraction Bilateral 04/2008     Current Outpatient Prescriptions  Medication Sig Dispense Refill  . allopurinol (ZYLOPRIM) 300 MG tablet Take 300 mg by mouth daily.    Marland Kitchen atorvastatin (LIPITOR) 10 MG tablet Take 1 tablet (10 mg total) by mouth daily. 90 tablet 3  . Calcium Carb-Cholecalciferol (CALCIUM + D3) 600-200 MG-UNIT TABS Take 1 tablet by mouth daily.    . digoxin (LANOXIN) 0.125 MG tablet Take 0.125 mg by mouth daily.    Marland Kitchen diltiazem (CARDIZEM CD) 240 MG 24 hr capsule Take 1 capsule (240 mg total) by mouth daily. 90 capsule 3  . finasteride (PROSCAR) 5 MG tablet Take 5 mg by mouth daily.    . hydrochlorothiazide (HYDRODIURIL) 25 MG tablet Take 25 mg by mouth daily.    Marland Kitchen KLOR-CON M20 20 MEQ tablet Take 20 mEq by mouth 2 (two) times daily.  11  . latanoprost (XALATAN) 0.005 % ophthalmic solution 1 drop at bedtime. 1 drop into  infected eye in the evening    . lisinopril (PRINIVIL,ZESTRIL) 10 MG tablet Take 1 tablet (10 mg total) by mouth daily. 90 tablet 2  . Magnesium 400 MG CAPS Take 1 tablet by mouth daily.     . Multiple Vitamin (MULTIVITAMIN) tablet Take 1 tablet by mouth daily.    . Omega-3 Fatty Acids (FISH OIL) 1000 MG CAPS Take 1 capsule by mouth daily.    . tamsulosin (FLOMAX) 0.4 MG CAPS capsule Take 0.4 mg by mouth daily.    Marland Kitchen warfarin (COUMADIN) 4 MG tablet TAKE 1 TABLET EVERY DAY AS DIRECTED  5   No current facility-administered medications for this visit.    Allergies:   Lovenox and Penicillins    Social History:  The patient  reports that he has quit smoking. He does not have any smokeless tobacco history on file. He reports that he drinks alcohol. He reports that he does not use illicit drugs.   Family History:  The patient's family history includes Hypertension in his mother; Prostate cancer in his father.    ROS:  Please see the history of present illness.  Otherwise, review of systems are positive for none.   All other systems are reviewed and negative.    PHYSICAL EXAM: VS:  BP 132/62 mmHg  Pulse 72  Ht 6\' 1"  (1.854 m)  Wt 170 lb 12.8 oz (77.474 kg)  BMI 22.54 kg/m2 , BMI Body mass index is 22.54 kg/(m^2). GEN: Well nourished, well developed, in no acute distress HEENT: normal Neck: no JVD, carotid bruits, or masses Cardiac: irregularly irregular; no murmurs, rubs, or gallops,no edema  Respiratory:  clear to auscultation bilaterally, normal work of breathing GI: soft, nontender, nondistended, + BS MS: no deformity or atrophy Skin: warm and dry, no rash Neuro:  Strength and sensation are intact Psych: euthymic mood, full affect   EKG:  EKG is not ordered today.    Recent Labs: 05/04/2015: BUN 32*; Creatinine, Ser 1.10; Hemoglobin 9.9*; Platelets 167; Potassium 4.2; Sodium 140    Lipid Panel No results found for: CHOL, TRIG, HDL, CHOLHDL, VLDL, LDLCALC, LDLDIRECT    Wt  Readings from Last 3 Encounters:  08/31/15 170 lb 12.8 oz (77.474 kg)  12/01/14 173 lb (78.472 kg)  06/04/14 178 lb (80.74 kg)    ASSESSMENT AND PLAN:  1. Chronic atrial fibrillation rate controlled - continue digoxin/diltiazem/warfarin 2. HTN - controlled - continue diltiazem/HCTZ/Lisinopril - check BMET and digoxin level 3. Chronic systemic anticoagulation 4. LE edema - resolved on diuretic   Current medicines are reviewed at length with the patient today.  The patient does not have concerns regarding medicines.  The following changes have been made:  no change  Labs/ tests ordered today: See above Assessment and Plan No orders of the defined types were placed in this encounter.     Disposition:   FU with me in 1 year  Signed, Isaac Margarita, MD  08/31/2015 8:41 AM    [facility] [place], [place], [place]  [address] Phone: [phone]; Fax: [phone]

## 2015-09-01 ENCOUNTER — Telehealth: Payer: Self-pay | Admitting: Cardiology

## 2015-09-01 LAB — DIGOXIN LEVEL: DIGOXIN LVL: 0.8 ug/L (ref 0.8–2.0)

## 2015-09-01 NOTE — Telephone Encounter (Signed)
Follow UP   Pt returned call for results

## 2015-09-01 NOTE — Telephone Encounter (Signed)
-----   Message from Sueanne Margarita, MD sent at 09/01/2015  9:31 AM EST ----- Please let patient know that labs were normal.  Continue current medical therapy.

## 2015-09-01 NOTE — Telephone Encounter (Signed)
Patient called to update home telephone number. Changed number in EPIC.  Informed patient of results and verbal understanding expressed.

## 2015-09-01 NOTE — Telephone Encounter (Signed)
Attempted to call home and cell phones but home is out of service and cell phone is off.  Released stable results to [place]. Instructed patient to call the office with any questions.   Instructed patient to send MyChart message with updated contact information.

## 2015-09-02 ENCOUNTER — Ambulatory Visit (INDEPENDENT_AMBULATORY_CARE_PROVIDER_SITE_OTHER): Payer: Medicare Other | Admitting: Podiatry

## 2015-09-02 ENCOUNTER — Encounter: Payer: Self-pay | Admitting: Podiatry

## 2015-09-02 DIAGNOSIS — M79673 Pain in unspecified foot: Secondary | ICD-10-CM

## 2015-09-02 DIAGNOSIS — B351 Tinea unguium: Secondary | ICD-10-CM | POA: Diagnosis not present

## 2015-09-02 DIAGNOSIS — M79609 Pain in unspecified limb: Principal | ICD-10-CM

## 2015-09-02 NOTE — Progress Notes (Signed)
Patient ID: Isaac Villa, male   DOB: [birth year], 82 y.o.   MRN: [id number] Complaint:  Visit Type: Patient returns to my office for continued preventative foot care services. Complaint: Patient states" my nails have grown long and thick and become painful to walk and wear shoes" . The patient presents for preventative foot care services. No changes to ROS  Podiatric Exam: Vascular: dorsalis pedis and posterior tibial pulses are palpable bilateral. Capillary return is immediate. Temperature gradient is WNL. Skin turgor WNL  Sensorium: Normal Semmes Weinstein monofilament test. Normal tactile sensation bilaterally. Nail Exam: Pt has thick disfigured discolored nails with subungual debris noted bilateral entire nail hallux through fifth toenails Ulcer Exam: There is no evidence of ulcer or pre-ulcerative changes or infection. Orthopedic Exam: Muscle tone and strength are WNL. No limitations in general ROM. No crepitus or effusions noted. Foot type and digits show no abnormalities. Bony prominences are unremarkable. Hammer toes 2-5 B/L Skin: No Porokeratosis. No infection or ulcers  Diagnosis:  Onychomycosis, , Pain in right toe, pain in left toes  Treatment & Plan Procedures and Treatment: Consent by patient was obtained for treatment procedures. The patient understood the discussion of treatment and procedures well. All questions were answered thoroughly reviewed. Debridement of mycotic and hypertrophic toenails, 1 through 5 bilateral and clearing of subungual debris. No ulceration, no infection noted.  Return Visit-Office Procedure: Patient instructed to return to the office for a follow up visit 3 months for continued evaluation and treatment.   Gardiner Barefoot DPM

## 2015-09-03 ENCOUNTER — Ambulatory Visit: Payer: Medicare Other | Admitting: Cardiology

## 2015-09-13 DIAGNOSIS — H401131 Primary open-angle glaucoma, bilateral, mild stage: Secondary | ICD-10-CM | POA: Diagnosis not present

## 2015-09-14 DIAGNOSIS — Z7901 Long term (current) use of anticoagulants: Secondary | ICD-10-CM | POA: Diagnosis not present

## 2015-10-01 DIAGNOSIS — Z5181 Encounter for therapeutic drug level monitoring: Secondary | ICD-10-CM | POA: Diagnosis not present

## 2015-10-21 ENCOUNTER — Ambulatory Visit: Payer: Medicare Other | Admitting: Cardiology

## 2015-10-25 DIAGNOSIS — Z7901 Long term (current) use of anticoagulants: Secondary | ICD-10-CM | POA: Diagnosis not present

## 2015-11-08 ENCOUNTER — Other Ambulatory Visit: Payer: Self-pay | Admitting: Cardiology

## 2015-11-11 ENCOUNTER — Encounter: Payer: Self-pay | Admitting: Podiatry

## 2015-11-11 ENCOUNTER — Ambulatory Visit (INDEPENDENT_AMBULATORY_CARE_PROVIDER_SITE_OTHER): Payer: Medicare Other | Admitting: Podiatry

## 2015-11-11 DIAGNOSIS — M79673 Pain in unspecified foot: Secondary | ICD-10-CM

## 2015-11-11 DIAGNOSIS — M2041 Other hammer toe(s) (acquired), right foot: Secondary | ICD-10-CM

## 2015-11-11 DIAGNOSIS — B351 Tinea unguium: Secondary | ICD-10-CM | POA: Diagnosis not present

## 2015-11-11 DIAGNOSIS — M2042 Other hammer toe(s) (acquired), left foot: Secondary | ICD-10-CM

## 2015-11-11 DIAGNOSIS — M79609 Pain in unspecified limb: Principal | ICD-10-CM

## 2015-11-11 NOTE — Progress Notes (Signed)
Patient ID: Isaac Villa, male   DOB: [birth year], 82 y.o.   MRN: [id number] Complaint:  Visit Type: Patient returns to my office for continued preventative foot care services. Complaint: Patient states" my nails have grown long and thick and become painful to walk and wear shoes" . The patient presents for preventative foot care services. No changes to ROS  Podiatric Exam: Vascular: dorsalis pedis and posterior tibial pulses are palpable bilateral. Capillary return is immediate. Temperature gradient is WNL. Skin turgor WNL  Sensorium: Normal Semmes Weinstein monofilament test. Normal tactile sensation bilaterally. Nail Exam: Pt has thick disfigured discolored nails with subungual debris noted bilateral entire nail hallux through fifth toenails Ulcer Exam: There is no evidence of ulcer or pre-ulcerative changes or infection. Orthopedic Exam: Muscle tone and strength are WNL. No limitations in general ROM. No crepitus or effusions noted. Foot type and digits show no abnormalities. Bony prominences are unremarkable. Hammer toes 2-5 B/L Skin: No Porokeratosis. No infection or ulcers  Diagnosis:  Onychomycosis, , Pain in right toe, pain in left toes  Treatment & Plan Procedures and Treatment: Consent by patient was obtained for treatment procedures. The patient understood the discussion of treatment and procedures well. All questions were answered thoroughly reviewed. Debridement of mycotic and hypertrophic toenails, 1 through 5 bilateral and clearing of subungual debris. No ulceration, no infection noted.  Return Visit-Office Procedure: Patient instructed to return to the office for a follow up visit 3 months for continued evaluation and treatment.   Gardiner Barefoot DPM

## 2015-11-17 DIAGNOSIS — S46111A Strain of muscle, fascia and tendon of long head of biceps, right arm, initial encounter: Secondary | ICD-10-CM | POA: Diagnosis not present

## 2015-11-17 DIAGNOSIS — S161XXA Strain of muscle, fascia and tendon at neck level, initial encounter: Secondary | ICD-10-CM | POA: Diagnosis not present

## 2015-11-23 DIAGNOSIS — Z7901 Long term (current) use of anticoagulants: Secondary | ICD-10-CM | POA: Diagnosis not present

## 2015-11-25 ENCOUNTER — Other Ambulatory Visit: Payer: Self-pay | Admitting: Cardiology

## 2015-11-26 ENCOUNTER — Other Ambulatory Visit: Payer: Self-pay | Admitting: Cardiology

## 2015-11-26 DIAGNOSIS — Z7901 Long term (current) use of anticoagulants: Secondary | ICD-10-CM | POA: Diagnosis not present

## 2015-11-26 DIAGNOSIS — H9222 Otorrhagia, left ear: Secondary | ICD-10-CM | POA: Diagnosis not present

## 2015-11-30 DIAGNOSIS — H938X2 Other specified disorders of left ear: Secondary | ICD-10-CM | POA: Diagnosis not present

## 2015-11-30 DIAGNOSIS — S161XXD Strain of muscle, fascia and tendon at neck level, subsequent encounter: Secondary | ICD-10-CM | POA: Diagnosis not present

## 2015-12-01 DIAGNOSIS — H9012 Conductive hearing loss, unilateral, left ear, with unrestricted hearing on the contralateral side: Secondary | ICD-10-CM | POA: Diagnosis not present

## 2015-12-01 DIAGNOSIS — Z7901 Long term (current) use of anticoagulants: Secondary | ICD-10-CM | POA: Diagnosis not present

## 2015-12-01 DIAGNOSIS — H9222 Otorrhagia, left ear: Secondary | ICD-10-CM | POA: Diagnosis not present

## 2015-12-09 DIAGNOSIS — L570 Actinic keratosis: Secondary | ICD-10-CM | POA: Diagnosis not present

## 2015-12-09 DIAGNOSIS — Z85828 Personal history of other malignant neoplasm of skin: Secondary | ICD-10-CM | POA: Diagnosis not present

## 2015-12-09 DIAGNOSIS — L821 Other seborrheic keratosis: Secondary | ICD-10-CM | POA: Diagnosis not present

## 2015-12-09 DIAGNOSIS — D1801 Hemangioma of skin and subcutaneous tissue: Secondary | ICD-10-CM | POA: Diagnosis not present

## 2015-12-09 DIAGNOSIS — D225 Melanocytic nevi of trunk: Secondary | ICD-10-CM | POA: Diagnosis not present

## 2015-12-09 DIAGNOSIS — Z85820 Personal history of malignant melanoma of skin: Secondary | ICD-10-CM | POA: Diagnosis not present

## 2015-12-28 DIAGNOSIS — Z0001 Encounter for general adult medical examination with abnormal findings: Secondary | ICD-10-CM | POA: Diagnosis not present

## 2015-12-28 DIAGNOSIS — M109 Gout, unspecified: Secondary | ICD-10-CM | POA: Diagnosis not present

## 2015-12-28 DIAGNOSIS — E7800 Pure hypercholesterolemia, unspecified: Secondary | ICD-10-CM | POA: Diagnosis not present

## 2015-12-28 DIAGNOSIS — Z5181 Encounter for therapeutic drug level monitoring: Secondary | ICD-10-CM | POA: Diagnosis not present

## 2015-12-28 DIAGNOSIS — I481 Persistent atrial fibrillation: Secondary | ICD-10-CM | POA: Diagnosis not present

## 2015-12-28 DIAGNOSIS — D509 Iron deficiency anemia, unspecified: Secondary | ICD-10-CM | POA: Diagnosis not present

## 2015-12-28 DIAGNOSIS — I10 Essential (primary) hypertension: Secondary | ICD-10-CM | POA: Diagnosis not present

## 2015-12-28 DIAGNOSIS — Z79899 Other long term (current) drug therapy: Secondary | ICD-10-CM | POA: Diagnosis not present

## 2015-12-30 DIAGNOSIS — H401131 Primary open-angle glaucoma, bilateral, mild stage: Secondary | ICD-10-CM | POA: Diagnosis not present

## 2015-12-31 DIAGNOSIS — M542 Cervicalgia: Secondary | ICD-10-CM | POA: Diagnosis not present

## 2016-01-06 ENCOUNTER — Telehealth: Payer: Self-pay | Admitting: Cardiology

## 2016-01-06 NOTE — Telephone Encounter (Signed)
Left message to call back for procedure to be done.

## 2016-01-06 NOTE — Telephone Encounter (Signed)
New message      Request for surgical clearance:  What type of surgery is being performed?  Neck procedure-----not sure of specific procedure When is this surgery scheduled? 02-01-16 Are there any medications that need to be held prior to surgery and how long? Stop coumadin 5 days prior 1. Name of physician performing surgery?  [facility]  2. What is your office phone and fax number? Fax to Dr Dorthy Cooler at [phone]

## 2016-01-07 NOTE — Telephone Encounter (Signed)
Follow up       Epidural steroid injection is being done

## 2016-01-07 NOTE — Telephone Encounter (Signed)
Stable from cardiac standpoint for surgery.  Ok to hold coumadin 5 days prior to surgery.

## 2016-01-07 NOTE — Telephone Encounter (Signed)
Faxed to Dr Dorthy Cooler at [phone].

## 2016-01-10 ENCOUNTER — Telehealth: Payer: Self-pay | Admitting: Cardiology

## 2016-01-10 NOTE — Telephone Encounter (Signed)
Re-faxed clearance to [facility] in Dr. Versie Starks office at [phone]. Also left her a VM that it had been re faxed.

## 2016-01-10 NOTE — Telephone Encounter (Signed)
New message     Nurse did not receive clearance.  Please refax it to [phone].  See 01-07-16 phone note

## 2016-01-11 ENCOUNTER — Telehealth: Payer: Self-pay | Admitting: Cardiology

## 2016-01-11 NOTE — Telephone Encounter (Signed)
Danae Chen called stating that she still has not received clearance fax.  She requested verbal clearance.  Reiterated Dr. Theodosia Blender notes:  Sueanne Margarita, MD at 01/07/2016 10:26 AM     Status: Signed       Expand All Collapse All   Stable from cardiac standpoint for surgery. Ok to hold coumadin 5 days prior to surgery.        Will fax again.

## 2016-01-11 NOTE — Telephone Encounter (Signed)
New Message:    Please call her,concerning Isaac Villa.

## 2016-01-13 ENCOUNTER — Telehealth: Payer: Self-pay | Admitting: Cardiology

## 2016-01-13 NOTE — Telephone Encounter (Signed)
Confirmed with patient that Dr. Radford Pax already cleared him for procedure and to hold Coumadin.  Sueanne Margarita, MD at 01/07/2016 10:26 AM     Status: Signed       Expand All Collapse All   Stable from cardiac standpoint for surgery. Ok to hold coumadin 5 days prior to surgery.        Confirmed with patient he is to ask Dr. Raliegh Ip questions about Coumadin as his office manages it. He was grateful for call.

## 2016-01-13 NOTE — Telephone Encounter (Signed)
New Message  Pt wife calling to speak w/ RN about upcoming Cortizone injection- stated that pt's PCP has sent information over to Dr Radford Pax- pt wife requested to discuss w./ RN. Please call back and discuss.

## 2016-01-13 NOTE — Telephone Encounter (Signed)
Follow Up Call   Mr. Binger is returning your call

## 2016-01-13 NOTE — Telephone Encounter (Signed)
Patient's wife is concerned because the patient is scheduled to have cortisone shots August 1 but she wanted Dr. Theodosia Blender opinion on stopping coumadin for procedure. She wants to schedule an OV with Dr. Radford Pax to discuss stopping Coumadin because Dr. Raliegh Ip did not feel comfortable stopping himself. She urged to call Dr. Raliegh Ip and speak to his nurse for clarification.  Called Dr. Marthann Schiller office and spoke to his nurse, Doroteo Bradford, to ask her if anything had transpired with the patient since last week (see clearance 7/6 phone note). Doroteo Bradford stated that nothing was changed and everything was sent over to Dr. Peri Maris office.  Called patient to clarify, but there was no answer. Left message to call back.

## 2016-01-17 DIAGNOSIS — Z7901 Long term (current) use of anticoagulants: Secondary | ICD-10-CM | POA: Diagnosis not present

## 2016-01-19 ENCOUNTER — Encounter: Payer: Self-pay | Admitting: Podiatry

## 2016-01-19 ENCOUNTER — Ambulatory Visit (INDEPENDENT_AMBULATORY_CARE_PROVIDER_SITE_OTHER): Payer: Medicare Other | Admitting: Podiatry

## 2016-01-19 DIAGNOSIS — M79676 Pain in unspecified toe(s): Secondary | ICD-10-CM

## 2016-01-19 DIAGNOSIS — M2042 Other hammer toe(s) (acquired), left foot: Secondary | ICD-10-CM

## 2016-01-19 DIAGNOSIS — B351 Tinea unguium: Secondary | ICD-10-CM | POA: Diagnosis not present

## 2016-01-19 DIAGNOSIS — M79609 Pain in unspecified limb: Principal | ICD-10-CM

## 2016-01-19 DIAGNOSIS — M2041 Other hammer toe(s) (acquired), right foot: Secondary | ICD-10-CM

## 2016-01-19 NOTE — Progress Notes (Signed)
Patient ID: Isaac Villa, male   DOB: [birth year], 82 y.o.   MRN: [id number] Complaint:  Visit Type: Patient returns to my office for continued preventative foot care services. Complaint: Patient states" my nails have grown long and thick and become painful to walk and wear shoes" . The patient presents for preventative foot care services. No changes to ROS  Podiatric Exam: Vascular: dorsalis pedis and posterior tibial pulses are palpable bilateral. Capillary return is immediate. Temperature gradient is WNL. Skin turgor WNL  Sensorium: Normal Semmes Weinstein monofilament test. Normal tactile sensation bilaterally. Nail Exam: Pt has thick disfigured discolored nails with subungual debris noted bilateral entire nail hallux through fifth toenails Ulcer Exam: There is no evidence of ulcer or pre-ulcerative changes or infection. Orthopedic Exam: Muscle tone and strength are WNL. No limitations in general ROM. No crepitus or effusions noted. Foot type and digits show no abnormalities. Bony prominences are unremarkable. Hammer toes 2-5 B/L Skin: No Porokeratosis. No infection or ulcers  Diagnosis:  Onychomycosis, , Pain in right toe, pain in left toes  Treatment & Plan Procedures and Treatment: Consent by patient was obtained for treatment procedures. The patient understood the discussion of treatment and procedures well. All questions were answered thoroughly reviewed. Debridement of mycotic and hypertrophic toenails, 1 through 5 bilateral and clearing of subungual debris. No ulceration, no infection noted.  Return Visit-Office Procedure: Patient instructed to return to the office for a follow up visit 3 months for continued evaluation and treatment.   Gardiner Barefoot DPM

## 2016-02-01 ENCOUNTER — Ambulatory Visit: Payer: Medicare Other

## 2016-02-08 ENCOUNTER — Ambulatory Visit (INDEPENDENT_AMBULATORY_CARE_PROVIDER_SITE_OTHER): Payer: Medicare Other | Admitting: Pharmacist Clinician (PhC)/ Clinical Pharmacy Specialist

## 2016-02-08 DIAGNOSIS — Z7901 Long term (current) use of anticoagulants: Secondary | ICD-10-CM | POA: Diagnosis not present

## 2016-02-08 DIAGNOSIS — M50222 Other cervical disc displacement at C5-C6 level: Secondary | ICD-10-CM | POA: Diagnosis not present

## 2016-02-08 DIAGNOSIS — I4891 Unspecified atrial fibrillation: Secondary | ICD-10-CM | POA: Insufficient documentation

## 2016-02-08 LAB — POCT INR: INR: 1.1

## 2016-02-14 DIAGNOSIS — Z7901 Long term (current) use of anticoagulants: Secondary | ICD-10-CM | POA: Diagnosis not present

## 2016-02-17 DIAGNOSIS — Z5181 Encounter for therapeutic drug level monitoring: Secondary | ICD-10-CM | POA: Diagnosis not present

## 2016-02-22 DIAGNOSIS — Z7901 Long term (current) use of anticoagulants: Secondary | ICD-10-CM | POA: Diagnosis not present

## 2016-02-28 DIAGNOSIS — R3915 Urgency of urination: Secondary | ICD-10-CM | POA: Diagnosis not present

## 2016-02-28 DIAGNOSIS — N401 Enlarged prostate with lower urinary tract symptoms: Secondary | ICD-10-CM | POA: Diagnosis not present

## 2016-02-28 DIAGNOSIS — R972 Elevated prostate specific antigen [PSA]: Secondary | ICD-10-CM | POA: Diagnosis not present

## 2016-02-29 DIAGNOSIS — M542 Cervicalgia: Secondary | ICD-10-CM | POA: Diagnosis not present

## 2016-03-01 DIAGNOSIS — M542 Cervicalgia: Secondary | ICD-10-CM | POA: Diagnosis not present

## 2016-03-07 DIAGNOSIS — Z7901 Long term (current) use of anticoagulants: Secondary | ICD-10-CM | POA: Diagnosis not present

## 2016-03-08 DIAGNOSIS — M542 Cervicalgia: Secondary | ICD-10-CM | POA: Diagnosis not present

## 2016-03-10 DIAGNOSIS — M542 Cervicalgia: Secondary | ICD-10-CM | POA: Diagnosis not present

## 2016-03-13 DIAGNOSIS — M542 Cervicalgia: Secondary | ICD-10-CM | POA: Diagnosis not present

## 2016-03-15 DIAGNOSIS — M542 Cervicalgia: Secondary | ICD-10-CM | POA: Diagnosis not present

## 2016-03-21 DIAGNOSIS — Z7901 Long term (current) use of anticoagulants: Secondary | ICD-10-CM | POA: Diagnosis not present

## 2016-03-22 ENCOUNTER — Ambulatory Visit: Payer: Medicare Other | Admitting: Podiatry

## 2016-03-28 DIAGNOSIS — Z23 Encounter for immunization: Secondary | ICD-10-CM | POA: Diagnosis not present

## 2016-04-03 DIAGNOSIS — H401131 Primary open-angle glaucoma, bilateral, mild stage: Secondary | ICD-10-CM | POA: Diagnosis not present

## 2016-04-06 ENCOUNTER — Encounter: Payer: Self-pay | Admitting: Podiatry

## 2016-04-06 ENCOUNTER — Ambulatory Visit (INDEPENDENT_AMBULATORY_CARE_PROVIDER_SITE_OTHER): Payer: Medicare Other | Admitting: Podiatry

## 2016-04-06 VITALS — BP 143/79 | HR 71 | Resp 14

## 2016-04-06 DIAGNOSIS — M79609 Pain in unspecified limb: Secondary | ICD-10-CM | POA: Diagnosis not present

## 2016-04-06 DIAGNOSIS — B351 Tinea unguium: Secondary | ICD-10-CM | POA: Diagnosis not present

## 2016-04-06 NOTE — Progress Notes (Signed)
Patient ID: Isaac Villa, male   DOB: 1925/03/25, 82 y.o.   MRN: [id number] Complaint:  Visit Type: Patient returns to my office for continued preventative foot care services. Complaint: Patient states" my nails have grown long and thick and become painful to walk and wear shoes" . The patient presents for preventative foot care services. No changes to ROS  Podiatric Exam: Vascular: dorsalis pedis and posterior tibial pulses are palpable bilateral. Capillary return is immediate. Temperature gradient is WNL. Skin turgor WNL  Sensorium: Normal Semmes Weinstein monofilament test. Normal tactile sensation bilaterally. Nail Exam: Pt has thick disfigured discolored nails with subungual debris noted bilateral entire nail hallux through fifth toenails Ulcer Exam: There is no evidence of ulcer or pre-ulcerative changes or infection. Orthopedic Exam: Muscle tone and strength are WNL. No limitations in general ROM. No crepitus or effusions noted. Foot type and digits show no abnormalities. Bony prominences are unremarkable. Hammer toes 2-5 B/L Skin: No Porokeratosis. No infection or ulcers  Diagnosis:  Onychomycosis, , Pain in right toe, pain in left toes  Treatment & Plan Procedures and Treatment: Consent by patient was obtained for treatment procedures. The patient understood the discussion of treatment and procedures well. All questions were answered thoroughly reviewed. Debridement of mycotic and hypertrophic toenails, 1 through 5 bilateral and clearing of subungual debris. No ulceration, no infection noted.  Return Visit-Office Procedure: Patient instructed to return to the office for a follow up visit 3 months for continued evaluation and treatment.   Gardiner Barefoot DPM

## 2016-04-10 DIAGNOSIS — Z7901 Long term (current) use of anticoagulants: Secondary | ICD-10-CM | POA: Diagnosis not present

## 2016-04-17 DIAGNOSIS — C44329 Squamous cell carcinoma of skin of other parts of face: Secondary | ICD-10-CM | POA: Diagnosis not present

## 2016-04-17 DIAGNOSIS — D485 Neoplasm of uncertain behavior of skin: Secondary | ICD-10-CM | POA: Diagnosis not present

## 2016-04-19 ENCOUNTER — Ambulatory Visit: Payer: Medicare Other | Admitting: Podiatry

## 2016-04-19 DIAGNOSIS — Z7901 Long term (current) use of anticoagulants: Secondary | ICD-10-CM | POA: Diagnosis not present

## 2016-04-19 DIAGNOSIS — Z5181 Encounter for therapeutic drug level monitoring: Secondary | ICD-10-CM | POA: Diagnosis not present

## 2016-05-02 DIAGNOSIS — M542 Cervicalgia: Secondary | ICD-10-CM | POA: Diagnosis not present

## 2016-05-12 DIAGNOSIS — Z7901 Long term (current) use of anticoagulants: Secondary | ICD-10-CM | POA: Diagnosis not present

## 2016-05-15 DIAGNOSIS — Z7901 Long term (current) use of anticoagulants: Secondary | ICD-10-CM | POA: Diagnosis not present

## 2016-05-22 DIAGNOSIS — Z7901 Long term (current) use of anticoagulants: Secondary | ICD-10-CM | POA: Diagnosis not present

## 2016-05-29 DIAGNOSIS — Z7901 Long term (current) use of anticoagulants: Secondary | ICD-10-CM | POA: Diagnosis not present

## 2016-05-30 DIAGNOSIS — M1711 Unilateral primary osteoarthritis, right knee: Secondary | ICD-10-CM | POA: Diagnosis not present

## 2016-06-05 DIAGNOSIS — Z7901 Long term (current) use of anticoagulants: Secondary | ICD-10-CM | POA: Diagnosis not present

## 2016-06-08 DIAGNOSIS — L570 Actinic keratosis: Secondary | ICD-10-CM | POA: Diagnosis not present

## 2016-06-14 ENCOUNTER — Encounter: Payer: Self-pay | Admitting: Podiatry

## 2016-06-14 ENCOUNTER — Ambulatory Visit (INDEPENDENT_AMBULATORY_CARE_PROVIDER_SITE_OTHER): Payer: Medicare Other | Admitting: Podiatry

## 2016-06-14 ENCOUNTER — Ambulatory Visit: Payer: Medicare Other | Admitting: Podiatry

## 2016-06-14 VITALS — Ht 73.0 in | Wt 170.0 lb

## 2016-06-14 DIAGNOSIS — B351 Tinea unguium: Secondary | ICD-10-CM | POA: Diagnosis not present

## 2016-06-14 DIAGNOSIS — M2041 Other hammer toe(s) (acquired), right foot: Secondary | ICD-10-CM

## 2016-06-14 DIAGNOSIS — M79609 Pain in unspecified limb: Secondary | ICD-10-CM

## 2016-06-14 DIAGNOSIS — M2042 Other hammer toe(s) (acquired), left foot: Secondary | ICD-10-CM

## 2016-06-14 NOTE — Progress Notes (Signed)
Patient ID: Isaac Villa, male   DOB: 1925-06-02, 82 y.o.   MRN: [id number] Complaint:  Visit Type: Patient returns to my office for continued preventative foot care services. Complaint: Patient states" my nails have grown long and thick and become painful to walk and wear shoes" . The patient presents for preventative foot care services. No changes to ROS  Podiatric Exam: Vascular: dorsalis pedis and posterior tibial pulses are palpable bilateral. Capillary return is immediate. Temperature gradient is WNL. Skin turgor WNL  Sensorium: Normal Semmes Weinstein monofilament test. Normal tactile sensation bilaterally. Nail Exam: Pt has thick disfigured discolored nails with subungual debris noted bilateral entire nail hallux through fifth toenails Ulcer Exam: There is no evidence of ulcer or pre-ulcerative changes or infection. Orthopedic Exam: Muscle tone and strength are WNL. No limitations in general ROM. No crepitus or effusions noted. Foot type and digits show no abnormalities. Bony prominences are unremarkable. Hammer toes 2-5 B/L Skin: No Porokeratosis. No infection or ulcers  Diagnosis:  Onychomycosis, , Pain in right toe, pain in left toes  Treatment & Plan Procedures and Treatment: Consent by patient was obtained for treatment procedures. The patient understood the discussion of treatment and procedures well. All questions were answered thoroughly reviewed. Debridement of mycotic and hypertrophic toenails, 1 through 5 bilateral and clearing of subungual debris. No ulceration, no infection noted.  Return Visit-Office Procedure: Patient instructed to return to the office for a follow up visit 10 weeks. for continued evaluation and treatment.   Gardiner Barefoot DPM

## 2016-06-15 ENCOUNTER — Ambulatory Visit: Payer: Medicare Other | Admitting: Podiatry

## 2016-06-22 DIAGNOSIS — F419 Anxiety disorder, unspecified: Secondary | ICD-10-CM | POA: Diagnosis not present

## 2016-06-22 DIAGNOSIS — Z5181 Encounter for therapeutic drug level monitoring: Secondary | ICD-10-CM | POA: Diagnosis not present

## 2016-06-22 DIAGNOSIS — I481 Persistent atrial fibrillation: Secondary | ICD-10-CM | POA: Diagnosis not present

## 2016-06-22 DIAGNOSIS — R42 Dizziness and giddiness: Secondary | ICD-10-CM | POA: Diagnosis not present

## 2016-06-28 DIAGNOSIS — Z7901 Long term (current) use of anticoagulants: Secondary | ICD-10-CM | POA: Diagnosis not present

## 2016-06-28 DIAGNOSIS — I481 Persistent atrial fibrillation: Secondary | ICD-10-CM | POA: Diagnosis not present

## 2016-06-28 DIAGNOSIS — I951 Orthostatic hypotension: Secondary | ICD-10-CM | POA: Diagnosis not present

## 2016-06-28 DIAGNOSIS — I10 Essential (primary) hypertension: Secondary | ICD-10-CM | POA: Diagnosis not present

## 2016-07-02 ENCOUNTER — Other Ambulatory Visit: Payer: Self-pay | Admitting: Cardiology

## 2016-07-04 DIAGNOSIS — Z7901 Long term (current) use of anticoagulants: Secondary | ICD-10-CM | POA: Diagnosis not present

## 2016-07-05 DIAGNOSIS — M1711 Unilateral primary osteoarthritis, right knee: Secondary | ICD-10-CM | POA: Diagnosis not present

## 2016-07-06 DIAGNOSIS — Z961 Presence of intraocular lens: Secondary | ICD-10-CM | POA: Diagnosis not present

## 2016-07-06 DIAGNOSIS — H524 Presbyopia: Secondary | ICD-10-CM | POA: Diagnosis not present

## 2016-07-06 DIAGNOSIS — H5213 Myopia, bilateral: Secondary | ICD-10-CM | POA: Diagnosis not present

## 2016-07-06 DIAGNOSIS — H401131 Primary open-angle glaucoma, bilateral, mild stage: Secondary | ICD-10-CM | POA: Diagnosis not present

## 2016-07-12 DIAGNOSIS — M1711 Unilateral primary osteoarthritis, right knee: Secondary | ICD-10-CM | POA: Diagnosis not present

## 2016-07-21 DIAGNOSIS — M1711 Unilateral primary osteoarthritis, right knee: Secondary | ICD-10-CM | POA: Diagnosis not present

## 2016-07-24 ENCOUNTER — Emergency Department (HOSPITAL_COMMUNITY): Payer: Medicare Other

## 2016-07-24 ENCOUNTER — Emergency Department (HOSPITAL_COMMUNITY)
Admission: EM | Admit: 2016-07-24 | Discharge: 2016-07-24 | Disposition: A | Discharge status: Home / Self Care | Payer: Medicare Other | Source: Home / Self Care | Attending: Emergency Medicine | Admitting: Emergency Medicine

## 2016-07-24 ENCOUNTER — Encounter (HOSPITAL_COMMUNITY): Payer: Self-pay | Admitting: Emergency Medicine

## 2016-07-24 DIAGNOSIS — S8011XA Contusion of right lower leg, initial encounter: Secondary | ICD-10-CM | POA: Insufficient documentation

## 2016-07-24 DIAGNOSIS — S79911A Unspecified injury of right hip, initial encounter: Secondary | ICD-10-CM | POA: Diagnosis not present

## 2016-07-24 DIAGNOSIS — W010XXA Fall on same level from slipping, tripping and stumbling without subsequent striking against object, initial encounter: Secondary | ICD-10-CM | POA: Insufficient documentation

## 2016-07-24 DIAGNOSIS — N179 Acute kidney failure, unspecified: Secondary | ICD-10-CM | POA: Diagnosis not present

## 2016-07-24 DIAGNOSIS — R930 Abnormal findings on diagnostic imaging of skull and head, not elsewhere classified: Secondary | ICD-10-CM

## 2016-07-24 DIAGNOSIS — Z79899 Other long term (current) drug therapy: Secondary | ICD-10-CM

## 2016-07-24 DIAGNOSIS — Y9289 Other specified places as the place of occurrence of the external cause: Secondary | ICD-10-CM

## 2016-07-24 DIAGNOSIS — M25551 Pain in right hip: Secondary | ICD-10-CM | POA: Diagnosis not present

## 2016-07-24 DIAGNOSIS — Z7901 Long term (current) use of anticoagulants: Secondary | ICD-10-CM

## 2016-07-24 DIAGNOSIS — Z87891 Personal history of nicotine dependence: Secondary | ICD-10-CM | POA: Insufficient documentation

## 2016-07-24 DIAGNOSIS — R531 Weakness: Secondary | ICD-10-CM | POA: Diagnosis not present

## 2016-07-24 DIAGNOSIS — E875 Hyperkalemia: Secondary | ICD-10-CM | POA: Diagnosis not present

## 2016-07-24 DIAGNOSIS — Z96652 Presence of left artificial knee joint: Secondary | ICD-10-CM | POA: Insufficient documentation

## 2016-07-24 DIAGNOSIS — S0990XA Unspecified injury of head, initial encounter: Secondary | ICD-10-CM | POA: Diagnosis not present

## 2016-07-24 DIAGNOSIS — R935 Abnormal findings on diagnostic imaging of other abdominal regions, including retroperitoneum: Secondary | ICD-10-CM

## 2016-07-24 DIAGNOSIS — Y999 Unspecified external cause status: Secondary | ICD-10-CM | POA: Insufficient documentation

## 2016-07-24 DIAGNOSIS — Y939 Activity, unspecified: Secondary | ICD-10-CM

## 2016-07-24 DIAGNOSIS — S300XXA Contusion of lower back and pelvis, initial encounter: Secondary | ICD-10-CM | POA: Diagnosis not present

## 2016-07-24 DIAGNOSIS — I10 Essential (primary) hypertension: Secondary | ICD-10-CM

## 2016-07-24 DIAGNOSIS — D649 Anemia, unspecified: Secondary | ICD-10-CM | POA: Diagnosis not present

## 2016-07-24 DIAGNOSIS — T148XXA Other injury of unspecified body region, initial encounter: Secondary | ICD-10-CM | POA: Diagnosis not present

## 2016-07-24 DIAGNOSIS — E860 Dehydration: Secondary | ICD-10-CM | POA: Diagnosis not present

## 2016-07-24 DIAGNOSIS — M25559 Pain in unspecified hip: Secondary | ICD-10-CM | POA: Diagnosis not present

## 2016-07-24 DIAGNOSIS — I482 Chronic atrial fibrillation: Secondary | ICD-10-CM | POA: Diagnosis not present

## 2016-07-24 MED ORDER — ACETAMINOPHEN 325 MG PO TABS
650.0000 mg | ORAL_TABLET | Freq: Once | ORAL | Status: AC
  Administered 2016-07-24: 650 mg via ORAL
  Filled 2016-07-24: qty 2

## 2016-07-24 MED ORDER — VITAMIN K1 10 MG/ML IJ SOLN
5.0000 mg | Freq: Once | INTRAMUSCULAR | Status: DC
  Filled 2016-07-24: qty 0.5

## 2016-07-24 NOTE — ED Notes (Signed)
Family at bedside. 

## 2016-07-24 NOTE — Discharge Instructions (Signed)
Please stop coumadin for the next 3 days. Ice the area well.  Return to the ER if there is any dizziness, fainting, shortness of breath with exertion.

## 2016-07-24 NOTE — ED Notes (Signed)
ED Provider at bedside. 

## 2016-07-24 NOTE — ED Notes (Signed)
Patient transported to X-ray 

## 2016-07-24 NOTE — ED Notes (Signed)
Bed: [facility] Expected date:  Expected time:  Means of arrival:  Comments: EMS- 83yo M, slip and fall/hip pain

## 2016-07-24 NOTE — ED Notes (Signed)
Patient requesting pain medication. MD made aware. 

## 2016-07-24 NOTE — ED Triage Notes (Signed)
Around 1000 pt slipped on ice at [facility]. Pt denies any pain aside from right hip pain. Pt was ambulatory after the incident. Pt denies LOC and denies head injury. No shortening or rotation noted. Pt does take coumadin

## 2016-07-24 NOTE — ED Provider Notes (Signed)
[facility] DEPT Provider Note   CSN: [id number] Arrival date & time: 07/24/16  1253     History   Chief Complaint Chief Complaint  Patient presents with  . Fall    HPI Isaac Villa is a 83 y.o. male.  HPI  Pt comes in with cc of fall. Pt is on coumadin for afib. Reports that he was getting out of his car and slipped and fell. Pt fell onto R side. He needed help to get up, and has pain with ambulation. Pt has no hx of hip fracture or replacement. Pt denies any neurologic symptoms.  Past Medical History:  Diagnosis Date  . Atrial fibrillation (HCC)    Chronic  . BPH (benign prostatic hypertrophy)   . Edema extremities 06/04/2014  . Elevated cholesterol   . Glaucoma   . Gout   . Hernia   . Herniated disc   . Hypertension   . Melanoma ([place])   . Melanoma ([place])   . Thrombocytopenia [facility])     Patient Active Problem List   Diagnosis Date Noted  . Atrial fibrillation ([place]) [I48.91] 02/08/2016  . Long term (current) use of anticoagulants [Z79.01] 02/08/2016  . Edema extremities 06/04/2014  . Chronic anticoagulation 06/04/2013  . Thrombocytopenia ([place])   . Chronic atrial fibrillation ([place]) 05/30/2013  . Essential hypertension, benign 05/30/2013    Past Surgical History:  Procedure Laterality Date  . APPENDECTOMY    . CATARACT EXTRACTION Bilateral 04/2008  . EYE SURGERY     lens replacements  . HERNIA REPAIR    . JOINT REPLACEMENT     left knee  . [place] SURGERY  07/2004  . REPLACEMENT TOTAL KNEE Left 11/2006  . TONSILLECTOMY         Home Medications    Prior to Admission medications   Medication Sig Start Date End Date Taking? Authorizing Provider  acetaminophen (TYLENOL) 650 MG CR tablet Take 1,300 mg by mouth every 8 (eight) hours as needed for pain.   Yes Historical Provider, MD  allopurinol (ZYLOPRIM) 300 MG tablet Take 300 mg by mouth daily.   Yes Historical Provider, MD  atorvastatin (LIPITOR) 10 MG tablet TAKE 1 TABLET (10 MG TOTAL) BY  MOUTH DAILY. 11/26/15  Yes Sueanne Margarita, MD  Calcium Carb-Cholecalciferol (CALCIUM + D3) 600-200 MG-UNIT TABS Take 1 tablet by mouth daily.   Yes Historical Provider, MD  digoxin (LANOXIN) 0.125 MG tablet Take 0.125 mg by mouth daily.   Yes Historical Provider, MD  diltiazem (CARDIZEM CD) 240 MG 24 hr capsule TAKE ONE CAPSULE BY MOUTH EVERY DAY Patient taking differently: TAKE ONE CAPSULE(240MG ) BY MOUTH EVERY DAY 11/09/15  Yes Sueanne Margarita, MD  finasteride (PROSCAR) 5 MG tablet Take 5 mg by mouth daily.   Yes Historical Provider, MD  hydrochlorothiazide (HYDRODIURIL) 25 MG tablet Take 25 mg by mouth daily.   Yes Historical Provider, MD  KLOR-CON M20 20 MEQ tablet Take 20 mEq by mouth 2 (two) times daily. 02/13/15  Yes Historical Provider, MD  latanoprost (XALATAN) 0.005 % ophthalmic solution 1 drop at bedtime. 1 drop into infected eye in the evening   Yes Historical Provider, MD  lisinopril (PRINIVIL,ZESTRIL) 10 MG tablet TAKE 1 TABLET BY MOUTH EVERY DAY Patient taking differently: TAKE 1 TABLET(10MG ) BY MOUTH EVERY DAY 07/04/16  Yes Sueanne Margarita, MD  Magnesium 400 MG CAPS Take 1 tablet by mouth daily.    Yes Historical Provider, MD  Multiple Vitamin (MULTIVITAMIN) tablet Take 1 tablet by  mouth daily.   Yes Historical Provider, MD  Omega-3 Fatty Acids (FISH OIL) 1000 MG CAPS Take 1 capsule by mouth daily.   Yes Historical Provider, MD  tamsulosin (FLOMAX) 0.4 MG CAPS capsule Take 0.4 mg by mouth daily.   Yes Historical Provider, MD  warfarin (COUMADIN) 4 MG tablet TAKE 1 TABLET(4MG ) BY MOUTH EVERY DAY AS DIRECTED 07/16/15  Yes Historical Provider, MD  lisinopril (PRINIVIL,ZESTRIL) 10 MG tablet Take 1 tablet (10 mg total) by mouth daily. 12/23/14   Sueanne Margarita, MD    Family History Family History  Problem Relation Age of Onset  . Hypertension Mother   . Prostate cancer Father     Social History Social History  Substance Use Topics  . Smoking status: Former Research scientist (life sciences)  . Smokeless tobacco:  Not on file  . Alcohol use Yes     Comment: 3-4 glasses of wine per week     Allergies   Enoxaparin; Lovenox [enoxaparin sodium]; and Penicillins   Review of Systems Review of Systems  ROS 10 Systems reviewed and are negative for acute change except as noted in the HPI.     Physical Exam Updated Vital Signs BP 130/69 (BP Location: Right Arm)   Pulse 70   Temp 97.8 F (36.6 C) (Oral)   Resp 16   SpO2 99%   Physical Exam  Constitutional: He is oriented to person, place, and time. He appears well-developed.  HENT:  Head: Normocephalic and atraumatic.  Eyes: Conjunctivae and EOM are normal. Pupils are equal, round, and reactive to light.  Neck: Normal range of motion. Neck supple.  Cardiovascular: Normal rate and regular rhythm.   Pulmonary/Chest: Effort normal and breath sounds normal.  Abdominal: Soft. Bowel sounds are normal. He exhibits no distension. There is no tenderness. There is no rebound and no guarding.  Musculoskeletal:  R hip pain, worse with palpation over the posterior and lateral side. Pt has tenderness with active leg raise on the R side.   Neurological: He is alert and oriented to person, place, and time.  Skin: Skin is warm.  Nursing note and vitals reviewed.    ED Treatments / Results  Labs (all labs ordered are listed, but only abnormal results are displayed) Labs Reviewed - No data to display  EKG  EKG Interpretation None       Radiology Ct Head Wo Contrast  Result Date: 07/24/2016 CLINICAL DATA:  Recent slip and fall with head injury and blood thinner usage, initial encounter EXAM: CT HEAD WITHOUT CONTRAST TECHNIQUE: Contiguous axial images were obtained from the base of the skull through the vertex without intravenous contrast. COMPARISON:  None. FINDINGS: Brain: Diffuse atrophic changes and chronic white matter ischemic changes noted. No findings to suggest acute hemorrhage, acute infarction or space-occupying mass lesion are noted.  Vascular: No hyperdense vessel or unexpected calcification. Skull: Normal. Negative for fracture or focal lesion. Sinuses/Orbits: No acute finding. Other: None. IMPRESSION: Atrophic changes and chronic white matter ischemic change. No acute abnormality is noted. Electronically Signed   By: Inez Catalina M.D.   On: 07/24/2016 16:27   Ct Pelvis Wo Contrast  Result Date: 07/24/2016 CLINICAL DATA:  Slipped on ice at [employer], with acute onset of right hip pain. Initial encounter. EXAM: CT PELVIS WITHOUT CONTRAST TECHNIQUE: Multidetector CT imaging of the pelvis was performed following the standard protocol without intravenous contrast. COMPARISON:  None. FINDINGS: Urinary Tract: The bladder is mildly distended and grossly unremarkable in appearance, with impression on the base of  bladder from the patient's significantly enlarged prostate. Bilateral renal cysts are seen, including a somewhat complex 4.9 cm left renal cyst with a slightly thickened wall and peripheral calcification. As this has very gradually grown from 1.8 cm in 2009, renal ultrasound or CT with contrast would be helpful for further evaluation, as deemed clinically appropriate. Bowel: Scattered diverticulosis is noted along the visualized portions of the colon, without evidence of diverticulitis. Vascular/Lymphatic: Scattered calcification is seen along the distal abdominal aorta and its branches. No inguinal or pelvic sidewall lymphadenopathy is seen. Enlarged retroperitoneal nodes measure up to 1.3 cm in short axis, raising question for underlying malignancy. Reproductive: The prostate is significantly enlarged, measuring 6.0 cm in transverse dimension, with minimal calcification. Other: There is a large soft tissue hematoma within the right gluteus musculature, with overlying bruising. The hematoma measures nearly 14 x 10 x 6 cm in size. Musculoskeletal: There is no definite evidence of fracture. Mild degenerative change is noted at the lower  lumbar spine, with underlying chronic right-sided pars defect at L4, and mild grade 1 anterolisthesis of L4 on L5. The remainder of the visualized musculature is grossly unremarkable in appearance. IMPRESSION: 1. No definite evidence of fracture at this time. 2. Large soft tissue hematoma at the right gluteus musculature, with overlying soft tissue bruising. The hematoma measures nearly 14 x 10 x 6 cm in size. 3. Enlarged retroperitoneal nodes, measuring up to 1.3 cm in short axis, raising question for underlying malignancy. Further evaluation could be considered as deemed clinically appropriate. 4. Significantly enlarged prostate.  Would correlate with PSA. 5. Somewhat complex 4.9 cm left renal cystic lesion demonstrates slightly thickened wall and peripheral calcification, and has very gradually grown from 1.8 cm in 2009. Renal ultrasound or CT with contrast would be helpful for further evaluation, if deemed clinically appropriate. 6. Chronic right-sided pars defect at L4, with mild grade 1 anterolisthesis of L4 on L5. Electronically Signed   By: Garald Balding M.D.   On: 07/24/2016 16:40   Dg Hip Unilat  With Pelvis 2-3 Views Right  Result Date: 07/24/2016 CLINICAL DATA:  Right hip pain after a fall today. Initial encounter. EXAM: DG HIP (WITH OR WITHOUT PELVIS) 2-3V RIGHT COMPARISON:  None. FINDINGS: Osteopenia. No fracture. Minimal collar osteophytosis along the right femoral head and minimal subchondral sclerosis in the right acetabulum. IMPRESSION: 1. No fracture or dislocation. 2. Minimal degenerative change in the right hip. Electronically Signed   By: Lorin Picket M.D.   On: 07/24/2016 13:51    Procedures Procedures (including critical care time)  Medications Ordered in ED Medications  phytonadione (VITAMIN K) SQ injection 5 mg (not administered)  acetaminophen (TYLENOL) tablet 650 mg (650 mg Oral Given 07/24/16 1527)     Initial Impression / Assessment and Plan / ED Course  I have  reviewed the triage vital signs and the nursing notes.  Pertinent labs & imaging results that were available during my care of the patient were reviewed by me and considered in my medical decision making (see chart for details).  Clinical Course as of Jul 24 1757  Mon Jul 24, 2016  1757 Results from the ER workup discussed with the patient face to face and all questions answered to the best of my ability.  Pt's INR today was 2.64 today We will give him Vit K dose, and pt advised not to take coumadin for 3 days. Strict ER return precautions have been discussed, and patient is agreeing with the plan and is  comfortable with the workup done and the recommendations from the ER.  CT Pelvis Wo Contrast [AN]    Clinical Course User Index [AN] Varney Biles, MD    Pt has pain over the R hip and he is on coumadin. Pt exam reveals tenderness over the R hip and pelvic region. Pt is ambulating. Xrays normal, but there is significant tenderness - so we will get CT to look for occult fracture or any evidence of bleeding.  Final Clinical Impressions(s) / ED Diagnoses   Final diagnoses:  Hematoma  Contusion of right lower leg, initial encounter    New Prescriptions New Prescriptions   No medications on file     Varney Biles, MD 07/24/16 1759

## 2016-07-25 ENCOUNTER — Telehealth: Payer: Self-pay | Admitting: Cardiology

## 2016-07-25 NOTE — Telephone Encounter (Signed)
New Message:   Pt is taking both Digoxin and Diltiazem,should he be taking both of them? Yesterday she told me not to take both of them.

## 2016-07-25 NOTE — Telephone Encounter (Signed)
Reviewed Dr. Theodosia Blender notes with patient's wife.  She understands to continue current treatment plan for him until he is seen by Dr. Radford Pax in February. She was grateful for call.

## 2016-07-27 ENCOUNTER — Inpatient Hospital Stay (HOSPITAL_COMMUNITY)
Admission: EM | Admit: 2016-07-27 | Discharge: 2016-08-01 | DRG: 605 | Disposition: A | Discharge status: Skilled Nursing Facility | Payer: Medicare Other | Attending: Internal Medicine | Admitting: Internal Medicine

## 2016-07-27 ENCOUNTER — Inpatient Hospital Stay (HOSPITAL_COMMUNITY): Payer: Medicare Other

## 2016-07-27 ENCOUNTER — Encounter (HOSPITAL_COMMUNITY): Payer: Self-pay

## 2016-07-27 DIAGNOSIS — I10 Essential (primary) hypertension: Secondary | ICD-10-CM | POA: Diagnosis present

## 2016-07-27 DIAGNOSIS — N179 Acute kidney failure, unspecified: Secondary | ICD-10-CM | POA: Diagnosis not present

## 2016-07-27 DIAGNOSIS — D649 Anemia, unspecified: Secondary | ICD-10-CM | POA: Diagnosis not present

## 2016-07-27 DIAGNOSIS — T796XXA Traumatic ischemia of muscle, initial encounter: Secondary | ICD-10-CM

## 2016-07-27 DIAGNOSIS — Z9841 Cataract extraction status, right eye: Secondary | ICD-10-CM | POA: Diagnosis not present

## 2016-07-27 DIAGNOSIS — S7011XA Contusion of right thigh, initial encounter: Secondary | ICD-10-CM | POA: Diagnosis not present

## 2016-07-27 DIAGNOSIS — Z8042 Family history of malignant neoplasm of prostate: Secondary | ICD-10-CM | POA: Diagnosis not present

## 2016-07-27 DIAGNOSIS — M25551 Pain in right hip: Secondary | ICD-10-CM | POA: Diagnosis not present

## 2016-07-27 DIAGNOSIS — E860 Dehydration: Secondary | ICD-10-CM | POA: Diagnosis present

## 2016-07-27 DIAGNOSIS — Z7901 Long term (current) use of anticoagulants: Secondary | ICD-10-CM

## 2016-07-27 DIAGNOSIS — M109 Gout, unspecified: Secondary | ICD-10-CM | POA: Diagnosis not present

## 2016-07-27 DIAGNOSIS — E7800 Pure hypercholesterolemia, unspecified: Secondary | ICD-10-CM | POA: Diagnosis present

## 2016-07-27 DIAGNOSIS — I480 Paroxysmal atrial fibrillation: Secondary | ICD-10-CM | POA: Diagnosis not present

## 2016-07-27 DIAGNOSIS — M79604 Pain in right leg: Secondary | ICD-10-CM | POA: Diagnosis not present

## 2016-07-27 DIAGNOSIS — S300XXA Contusion of lower back and pelvis, initial encounter: Secondary | ICD-10-CM | POA: Diagnosis present

## 2016-07-27 DIAGNOSIS — R531 Weakness: Secondary | ICD-10-CM

## 2016-07-27 DIAGNOSIS — R0602 Shortness of breath: Secondary | ICD-10-CM | POA: Diagnosis not present

## 2016-07-27 DIAGNOSIS — E8809 Other disorders of plasma-protein metabolism, not elsewhere classified: Secondary | ICD-10-CM | POA: Diagnosis not present

## 2016-07-27 DIAGNOSIS — I482 Chronic atrial fibrillation: Secondary | ICD-10-CM | POA: Diagnosis not present

## 2016-07-27 DIAGNOSIS — H409 Unspecified glaucoma: Secondary | ICD-10-CM | POA: Diagnosis present

## 2016-07-27 DIAGNOSIS — Z880 Allergy status to penicillin: Secondary | ICD-10-CM | POA: Diagnosis not present

## 2016-07-27 DIAGNOSIS — S3993XA Unspecified injury of pelvis, initial encounter: Secondary | ICD-10-CM | POA: Diagnosis not present

## 2016-07-27 DIAGNOSIS — S7011XD Contusion of right thigh, subsequent encounter: Secondary | ICD-10-CM | POA: Diagnosis not present

## 2016-07-27 DIAGNOSIS — Z85820 Personal history of malignant melanoma of skin: Secondary | ICD-10-CM

## 2016-07-27 DIAGNOSIS — Z8249 Family history of ischemic heart disease and other diseases of the circulatory system: Secondary | ICD-10-CM

## 2016-07-27 DIAGNOSIS — Z888 Allergy status to other drugs, medicaments and biological substances status: Secondary | ICD-10-CM

## 2016-07-27 DIAGNOSIS — E875 Hyperkalemia: Secondary | ICD-10-CM | POA: Diagnosis not present

## 2016-07-27 DIAGNOSIS — N400 Enlarged prostate without lower urinary tract symptoms: Secondary | ICD-10-CM | POA: Diagnosis present

## 2016-07-27 DIAGNOSIS — R319 Hematuria, unspecified: Secondary | ICD-10-CM | POA: Diagnosis not present

## 2016-07-27 DIAGNOSIS — M6282 Rhabdomyolysis: Secondary | ICD-10-CM | POA: Diagnosis present

## 2016-07-27 DIAGNOSIS — W000XXA Fall on same level due to ice and snow, initial encounter: Secondary | ICD-10-CM | POA: Diagnosis present

## 2016-07-27 DIAGNOSIS — Z96652 Presence of left artificial knee joint: Secondary | ICD-10-CM | POA: Diagnosis present

## 2016-07-27 DIAGNOSIS — S300XXD Contusion of lower back and pelvis, subsequent encounter: Secondary | ICD-10-CM | POA: Diagnosis not present

## 2016-07-27 DIAGNOSIS — M7989 Other specified soft tissue disorders: Secondary | ICD-10-CM | POA: Diagnosis not present

## 2016-07-27 DIAGNOSIS — I959 Hypotension, unspecified: Secondary | ICD-10-CM | POA: Diagnosis not present

## 2016-07-27 DIAGNOSIS — T148XXA Other injury of unspecified body region, initial encounter: Secondary | ICD-10-CM | POA: Diagnosis not present

## 2016-07-27 DIAGNOSIS — R069 Unspecified abnormalities of breathing: Secondary | ICD-10-CM

## 2016-07-27 DIAGNOSIS — S7011XS Contusion of right thigh, sequela: Secondary | ICD-10-CM | POA: Diagnosis not present

## 2016-07-27 DIAGNOSIS — T796XXD Traumatic ischemia of muscle, subsequent encounter: Secondary | ICD-10-CM | POA: Diagnosis not present

## 2016-07-27 DIAGNOSIS — I4891 Unspecified atrial fibrillation: Secondary | ICD-10-CM | POA: Diagnosis present

## 2016-07-27 DIAGNOSIS — R791 Abnormal coagulation profile: Secondary | ICD-10-CM | POA: Diagnosis present

## 2016-07-27 DIAGNOSIS — Z471 Aftercare following joint replacement surgery: Secondary | ICD-10-CM | POA: Diagnosis not present

## 2016-07-27 DIAGNOSIS — R52 Pain, unspecified: Secondary | ICD-10-CM

## 2016-07-27 DIAGNOSIS — Z9842 Cataract extraction status, left eye: Secondary | ICD-10-CM | POA: Diagnosis not present

## 2016-07-27 DIAGNOSIS — I621 Nontraumatic extradural hemorrhage: Secondary | ICD-10-CM | POA: Diagnosis not present

## 2016-07-27 DIAGNOSIS — T796XXS Traumatic ischemia of muscle, sequela: Secondary | ICD-10-CM | POA: Diagnosis not present

## 2016-07-27 DIAGNOSIS — N390 Urinary tract infection, site not specified: Secondary | ICD-10-CM | POA: Diagnosis not present

## 2016-07-27 LAB — COMPREHENSIVE METABOLIC PANEL
ALBUMIN: 2.8 g/dL — AB (ref 3.5–5.0)
ALK PHOS: 54 U/L (ref 38–126)
ALT: 11 U/L — ABNORMAL LOW (ref 17–63)
AST: 37 U/L (ref 15–41)
Anion gap: 7 (ref 5–15)
BUN: 52 mg/dL — AB (ref 6–20)
CALCIUM: 8.4 mg/dL — AB (ref 8.9–10.3)
CO2: 25 mmol/L (ref 22–32)
CREATININE: 1.41 mg/dL — AB (ref 0.61–1.24)
Chloride: 103 mmol/L (ref 101–111)
GFR calc Af Amer: 49 mL/min — ABNORMAL LOW (ref >60)
GFR, EST NON AFRICAN AMERICAN: 43 mL/min — AB (ref >60)
GLUCOSE: 141 mg/dL — AB (ref 65–99)
Potassium: 5.9 mmol/L — ABNORMAL HIGH (ref 3.5–5.1)
Sodium: 135 mmol/L (ref 135–145)
TOTAL PROTEIN: 5 g/dL — AB (ref 6.5–8.1)
Total Bilirubin: 0.9 mg/dL (ref 0.3–1.2)

## 2016-07-27 LAB — CBC WITH DIFFERENTIAL/PLATELET
BASOS ABS: 0 10*3/uL (ref 0.0–0.1)
BASOS PCT: 0 %
EOS ABS: 0 10*3/uL (ref 0.0–0.7)
EOS PCT: 0 %
HCT: 19.9 % — ABNORMAL LOW (ref 39.0–52.0)
Hemoglobin: 6.4 g/dL — CL (ref 13.0–17.0)
Lymphocytes Relative: 7 %
Lymphs Abs: 1 10*3/uL (ref 0.7–4.0)
MCH: 29.8 pg (ref 26.0–34.0)
MCHC: 32.2 g/dL (ref 30.0–36.0)
MCV: 92.6 fL (ref 78.0–100.0)
Monocytes Absolute: 1.2 10*3/uL — ABNORMAL HIGH (ref 0.1–1.0)
Monocytes Relative: 8 %
Neutro Abs: 12.7 10*3/uL — ABNORMAL HIGH (ref 1.7–7.7)
Neutrophils Relative %: 85 %
PLATELETS: 181 10*3/uL (ref 150–400)
RBC: 2.15 MIL/uL — AB (ref 4.22–5.81)
RDW: 14.8 % (ref 11.5–15.5)
WBC: 14.9 10*3/uL — AB (ref 4.0–10.5)

## 2016-07-27 LAB — GLUCOSE, CAPILLARY: GLUCOSE-CAPILLARY: 114 mg/dL — AB (ref 65–99)

## 2016-07-27 LAB — PREPARE RBC (CROSSMATCH)

## 2016-07-27 LAB — PROTIME-INR
INR: 2.39
PROTHROMBIN TIME: 26.5 s — AB (ref 11.4–15.2)

## 2016-07-27 LAB — DIGOXIN LEVEL: DIGOXIN LVL: 0.8 ng/mL (ref 0.8–2.0)

## 2016-07-27 MED ORDER — FINASTERIDE 5 MG PO TABS
5.0000 mg | ORAL_TABLET | Freq: Every day | ORAL | Status: DC
Start: 2016-07-27 — End: 2016-08-01
  Administered 2016-07-29 – 2016-07-31 (×3): 5 mg via ORAL
  Filled 2016-07-27 (×4): qty 1

## 2016-07-27 MED ORDER — SODIUM CHLORIDE 0.9 % IV BOLUS (SEPSIS)
500.0000 mL | Freq: Once | INTRAVENOUS | Status: AC
  Administered 2016-07-27: 500 mL via INTRAVENOUS

## 2016-07-27 MED ORDER — DIGOXIN 125 MCG PO TABS
0.1250 mg | ORAL_TABLET | Freq: Every day | ORAL | Status: DC
  Administered 2016-07-28 – 2016-08-01 (×5): 0.125 mg via ORAL
  Filled 2016-07-27 (×5): qty 1

## 2016-07-27 MED ORDER — ONDANSETRON HCL 4 MG/2ML IJ SOLN: 4.0000 mg | Freq: Four times a day (QID) | INTRAMUSCULAR | Status: DC | PRN

## 2016-07-27 MED ORDER — SODIUM CHLORIDE 0.9% FLUSH
3.0000 mL | Freq: Two times a day (BID) | INTRAVENOUS | Status: DC
  Administered 2016-07-27 – 2016-08-01 (×9): 3 mL via INTRAVENOUS

## 2016-07-27 MED ORDER — ALLOPURINOL 300 MG PO TABS
300.0000 mg | ORAL_TABLET | Freq: Every day | ORAL | Status: DC
  Administered 2016-07-28 – 2016-07-31 (×4): 300 mg via ORAL
  Filled 2016-07-27 (×4): qty 1

## 2016-07-27 MED ORDER — TAMSULOSIN HCL 0.4 MG PO CAPS
0.4000 mg | ORAL_CAPSULE | Freq: Every day | ORAL | Status: DC
  Administered 2016-07-29 – 2016-08-01 (×4): 0.4 mg via ORAL
  Filled 2016-07-27 (×5): qty 1

## 2016-07-27 MED ORDER — FUROSEMIDE 10 MG/ML IJ SOLN
20.0000 mg | Freq: Once | INTRAMUSCULAR | Status: AC
  Administered 2016-07-27: 20 mg via INTRAVENOUS
  Filled 2016-07-27: qty 2

## 2016-07-27 MED ORDER — DILTIAZEM HCL ER COATED BEADS 240 MG PO CP24
240.0000 mg | ORAL_CAPSULE | Freq: Every day | ORAL | Status: DC
Start: 2016-07-28 — End: 2016-08-01
  Administered 2016-07-28 – 2016-08-01 (×5): 240 mg via ORAL
  Filled 2016-07-27 (×5): qty 1

## 2016-07-27 MED ORDER — MAGNESIUM OXIDE 400 (241.3 MG) MG PO TABS
400.0000 mg | ORAL_TABLET | Freq: Every day | ORAL | Status: DC
  Administered 2016-07-28 – 2016-08-01 (×5): 400 mg via ORAL
  Filled 2016-07-27 (×6): qty 1

## 2016-07-27 MED ORDER — ATORVASTATIN CALCIUM 10 MG PO TABS
10.0000 mg | ORAL_TABLET | Freq: Every day | ORAL | Status: DC
  Administered 2016-07-27 – 2016-07-28 (×2): 10 mg via ORAL
  Filled 2016-07-27 (×2): qty 1

## 2016-07-27 MED ORDER — LATANOPROST 0.005 % OP SOLN
1.0000 [drp] | Freq: Every day | OPHTHALMIC | Status: DC
  Administered 2016-07-27 – 2016-07-31 (×5): 1 [drp] via OPHTHALMIC
  Filled 2016-07-27: qty 2.5

## 2016-07-27 MED ORDER — INSULIN ASPART 100 UNIT/ML ~~LOC~~ SOLN
0.0000 [IU] | Freq: Three times a day (TID) | SUBCUTANEOUS | Status: DC
  Administered 2016-07-29: 2 [IU] via SUBCUTANEOUS
  Administered 2016-07-30 – 2016-07-31 (×5): 1 [IU] via SUBCUTANEOUS
  Administered 2016-08-01 (×2): 2 [IU] via SUBCUTANEOUS

## 2016-07-27 MED ORDER — SODIUM CHLORIDE 0.9 % IV SOLN: INTRAVENOUS | Status: DC

## 2016-07-27 MED ORDER — ACETAMINOPHEN 325 MG PO TABS: 650.0000 mg | ORAL_TABLET | Freq: Four times a day (QID) | ORAL | Status: DC | PRN

## 2016-07-27 MED ORDER — HYDROCHLOROTHIAZIDE 25 MG PO TABS: 25.0000 mg | ORAL_TABLET | Freq: Every day | ORAL | Status: DC

## 2016-07-27 MED ORDER — SODIUM CHLORIDE 0.9 % IV SOLN: Freq: Once | INTRAVENOUS | Status: DC

## 2016-07-27 MED ORDER — SODIUM CHLORIDE 0.9 % IV SOLN: 10.0000 mL/h | Freq: Once | INTRAVENOUS | Status: DC

## 2016-07-27 MED ORDER — ONDANSETRON HCL 4 MG PO TABS: 4.0000 mg | ORAL_TABLET | Freq: Four times a day (QID) | ORAL | Status: DC | PRN

## 2016-07-27 MED ORDER — DOCUSATE SODIUM 100 MG PO CAPS
100.0000 mg | ORAL_CAPSULE | Freq: Two times a day (BID) | ORAL | Status: DC
  Administered 2016-07-28 – 2016-08-01 (×8): 100 mg via ORAL
  Filled 2016-07-27 (×10): qty 1

## 2016-07-27 MED ORDER — SODIUM CHLORIDE 0.9 % IV SOLN
Freq: Once | INTRAVENOUS | Status: AC
  Administered 2016-07-27: 18:00:00 via INTRAVENOUS

## 2016-07-27 MED ORDER — VITAMIN K1 10 MG/ML IJ SOLN
10.0000 mg | INTRAVENOUS | Status: AC
  Administered 2016-07-27: 10 mg via INTRAVENOUS
  Filled 2016-07-27: qty 1

## 2016-07-27 MED ORDER — PROTHROMBIN COMPLEX CONC HUMAN 500 UNITS IV KIT: 25.0000 [IU]/kg | PACK | Status: DC

## 2016-07-27 MED ORDER — SODIUM CHLORIDE 0.9 % IV SOLN: INTRAVENOUS | Status: AC

## 2016-07-27 MED ORDER — HYDROCODONE-ACETAMINOPHEN 5-325 MG PO TABS
1.0000 | ORAL_TABLET | Freq: Four times a day (QID) | ORAL | Status: DC | PRN
  Administered 2016-07-27 – 2016-07-30 (×5): 1 via ORAL
  Filled 2016-07-27 (×5): qty 1

## 2016-07-27 MED ORDER — ACETAMINOPHEN 650 MG RE SUPP: 650.0000 mg | Freq: Four times a day (QID) | RECTAL | Status: DC | PRN

## 2016-07-27 NOTE — H&P (Signed)
History and Physical    Isaac Villa [id number] DOB: 1925/04/08 DOA: 07/27/2016  PCP: [name], MD Patient coming from: home  Chief Complaint: right hip pain/generalized weakness  HPI: Isaac Villa is a delightful 83 y.o. male with medical history significant for hypertension, BPH, A. fib on Coumadin, presents to the emergency department with the chief complaint of worsening generalized weakness shortness of breath dizziness with position change. Initial evaluation reveals hg 6.4, potassium 5.9 worsening ecchymosis right flank  Information is obtained from the patient and the family is at the bedside. She reports she suffered a fall 3 days ago. His history of A. fib and is on Coumadin. He was evaluated [facility] and recommended to hold his Coumadin for 3 days. At that time his INR was 2.64. He was given a dose of vitamin K as well. Over the next couple of days he became gradually weaker and weaker. He developed ecchymosis on the right hip flank that spread and now covers the entire right by doc right thigh anteriorly and posteriorly. Associated symptoms include dizziness dyspnea with exertion. He denies fever chills cough and lower extremity edema. He also reports he "fell out of bed" this morning. He denies hitting his head. He reports that he has stopped taking his Coumadin per instructions. Also reports increased pain in that right flank area. He denies abdominal pain nausea vomiting diarrhea constipation. He denies dysuria hematuria frequency or urgency. He denies chest pain palpitations cough   ED Course: In the emergency department he's afebrile hemodynamically stable and not hypoxic.  Review of Systems: As per HPI otherwise 10 point review of systems negative.   Ambulatory Status: This is fall 3 days ago he has been using I have walker. Prior to that he was independent with ADLs and had no recent falls  Past Medical History:  Diagnosis Date  . Atrial fibrillation (HCC)     Chronic  . BPH (benign prostatic hypertrophy)   . Edema extremities 06/04/2014  . Elevated cholesterol   . Glaucoma   . Gout   . Hernia   . Herniated disc   . Hypertension   . Melanoma ([place])   . Melanoma ([place])   . Thrombocytopenia ([place])     Past Surgical History:  Procedure Laterality Date  . APPENDECTOMY    . CATARACT EXTRACTION Bilateral 04/2008  . EYE SURGERY     lens replacements  . HERNIA REPAIR    . JOINT REPLACEMENT     left knee  . [place] SURGERY  07/2004  . REPLACEMENT TOTAL KNEE Left 11/2006  . TONSILLECTOMY      Social History   Social History  . Marital status: Married    Spouse name: N/A  . Number of children: N/A  . Years of education: N/A   Occupational History  . Not on file.   Social History Main Topics  . Smoking status: Former Research scientist (life sciences)  . Smokeless tobacco: Never Used  . Alcohol use Yes     Comment: 3-4 glasses of wine per week  . Drug use: No  . Sexual activity: Not on file   Other Topics Concern  . Not on file   Social History Narrative  . No narrative on file  He lives at home with his wife has done so for the last 60 years. He is a retired Software engineer of a [employer]  . Enoxaparin Other (See Comments)    Reaction:  Internal bleeding  .  Penicillins Other (See Comments)    Reaction:  Causes pt to pass out  Has patient had a PCN reaction causing immediate rash, facial/tongue/throat swelling, SOB or lightheadedness with hypotension: Yes Has patient had a PCN reaction causing severe rash involving mucus membranes or skin necrosis: No Has patient had a PCN reaction that required hospitalization Yes Has patient had a PCN reaction occurring within the last 10 years: No If all of the above answers are "NO", then may proceed with Cephalosporin use.    Family History  Problem Relation Age of Onset  . Hypertension Mother   . Prostate cancer Father     Prior to Admission medications     Medication Sig Start Date End Date Taking? Authorizing Provider  acetaminophen (TYLENOL) 650 MG CR tablet Take 1,300 mg by mouth every 8 (eight) hours as needed for pain.   Yes Historical Provider, MD  allopurinol (ZYLOPRIM) 300 MG tablet Take 300 mg by mouth daily at 12 noon.    Yes Historical Provider, MD  atorvastatin (LIPITOR) 10 MG tablet Take 10 mg by mouth at bedtime.   Yes Historical Provider, MD  Calcium Carb-Cholecalciferol (CALCIUM + D3) 600-200 MG-UNIT TABS Take 1 tablet by mouth daily at 12 noon.    Yes Historical Provider, MD  digoxin (LANOXIN) 0.125 MG tablet Take 0.125 mg by mouth daily.   Yes Historical Provider, MD  diltiazem (CARDIZEM CD) 240 MG 24 hr capsule Take 240 mg by mouth daily at 12 noon.   Yes Historical Provider, MD  finasteride (PROSCAR) 5 MG tablet Take 5 mg by mouth at bedtime.    Yes Historical Provider, MD  hydrochlorothiazide (HYDRODIURIL) 25 MG tablet Take 25 mg by mouth daily.   Yes Historical Provider, MD  HYDROcodone-acetaminophen (NORCO/VICODIN) 5-325 MG tablet Take 1 tablet by mouth every 6 (six) hours as needed for moderate pain.   Yes Historical Provider, MD  latanoprost (XALATAN) 0.005 % ophthalmic solution Place 1 drop into both eyes at bedtime.    Yes Historical Provider, MD  lisinopril (PRINIVIL,ZESTRIL) 10 MG tablet Take 1 tablet (10 mg total) by mouth daily. 12/23/14  Yes Sueanne Margarita, MD  magnesium oxide (MAGNESIUM-OXIDE) 400 (241.3 Mg) MG tablet Take 400 mg by mouth daily at 12 noon.   Yes Historical Provider, MD  Multiple Vitamin (MULTIVITAMIN WITH MINERALS) TABS tablet Take 1 tablet by mouth daily at 12 noon.   Yes Historical Provider, MD  omega-3 acid ethyl esters (LOVAZA) 1 g capsule Take 1 g by mouth daily at 12 noon.   Yes Historical Provider, MD  potassium chloride SA (K-DUR,KLOR-CON) 20 MEQ tablet Take 20 mEq by mouth 2 (two) times daily.   Yes Historical Provider, MD  tamsulosin (FLOMAX) 0.4 MG CAPS capsule Take 0.4 mg by mouth daily.    Yes Historical Provider, MD  warfarin (COUMADIN) 4 MG tablet Take 4 mg by mouth daily.   Yes Historical Provider, MD    Physical Exam: Vitals:   07/27/16 1440 07/27/16 1606 07/27/16 1645 07/27/16 1715  BP: 125/58 126/55 125/82 117/86  Pulse: 88 79 80 (!) 130  Resp: 18 21 23 18   Temp: 98.4 F (36.9 C)     TempSrc: Oral     SpO2: 98% 99% 100% 99%     General:  Appears calm and comfortable Slightly pale/yellow Eyes:  PERRL, EOMI, normal lids, iris ENT:  grossly normal hearing, lips & tongue, because membranes of his mouth are pale slightly dry Neck:  no LAD, masses or thyromegaly  Cardiovascular:  Irregularly irregular, no m/r/g. No LE edema. Pedal pulses and right foot palpable Respiratory:  CTA bilaterally, no w/r/r. Normal respiratory effort. Abdomen:  soft, ntnd, positive bowel sounds no guarding or rebounding Skin:  no rash or induration seen on limited exam. Ecchymosis from right flank extending to right buttock and right posterior thigh. Extending to right hip distal area of buttock is firm to touch otherwise no heat no real swelling. Pulses palpable range of motion to that hip Musculoskeletal:  grossly normal tone BUE/BLE, good ROM, no bony abnormality Psychiatric:  grossly normal mood and affect, speech fluent and appropriate, AOx3 Neurologic:  CN 2-12 grossly intact, moves all extremities in coordinated fashion, sensation intact  Labs on Admission: I have personally reviewed following labs and imaging studies  CBC:  Recent Labs Lab 07/27/16 1541  WBC 14.9*  NEUTROABS 12.7*  HGB 6.4*  HCT 19.9*  MCV 92.6  PLT 0000000   Basic Metabolic Panel:  Recent Labs Lab 07/27/16 1541  NA 135  K 5.9*  CL 103  CO2 25  GLUCOSE 141*  BUN 52*  CREATININE 1.41*  CALCIUM 8.4*   GFR: CrCl cannot be calculated (Unknown ideal weight.). Liver Function Tests:  Recent Labs Lab 07/27/16 1541  AST 37  ALT 11*  ALKPHOS 54  BILITOT 0.9  PROT 5.0*  ALBUMIN 2.8*   No results  for input(s): LIPASE, AMYLASE in the last 168 hours. No results for input(s): AMMONIA in the last 168 hours. Coagulation Profile:  Recent Labs Lab 07/27/16 1541  INR 2.39   Cardiac Enzymes: No results for input(s): CKTOTAL, CKMB, CKMBINDEX, TROPONINI in the last 168 hours. BNP (last 3 results) No results for input(s): PROBNP in the last 8760 hours. HbA1C: No results for input(s): HGBA1C in the last 72 hours. CBG: No results for input(s): GLUCAP in the last 168 hours. Lipid Profile: No results for input(s): CHOL, HDL, LDLCALC, TRIG, CHOLHDL, LDLDIRECT in the last 72 hours. Thyroid Function Tests: No results for input(s): TSH, T4TOTAL, FREET4, T3FREE, THYROIDAB in the last 72 hours. Anemia Panel: No results for input(s): VITAMINB12, FOLATE, FERRITIN, TIBC, IRON, RETICCTPCT in the last 72 hours. Urine analysis: No results found for: COLORURINE, APPEARANCEUR, LABSPEC, PHURINE, GLUCOSEU, HGBUR, BILIRUBINUR, KETONESUR, PROTEINUR, UROBILINOGEN, NITRITE, LEUKOCYTESUR  Creatinine Clearance: CrCl cannot be calculated (Unknown ideal weight.).  Sepsis Labs: @LABRCNTIP (procalcitonin:4,lacticidven:4) )No results found for this or any previous visit (from the past 240 hour(s)).   Radiological Exams on Admission: No results found.  EKG: Independently reviewed. Atrial fibrillation Non-specific intra-ventricular conduction delay ST-t wave abnormality No significant change since last tracing Abnormal ekg  Assessment/Plan Principal Problem:   Symptomatic anemia Active Problems:   Essential hypertension, benign   Chronic anticoagulation   Atrial fibrillation (HCC) [I48.91]   Generalized weakness   Hyperkalemia   Acute kidney injury ([place])   Elevated INR   Hematoma   #1. Symptomatic anemia. Secondary to worsening hematoma related to recent fall in the setting of supratherapeutic INR related to Coumadin. Hemoglobin 6.4 on admission. Patient with mild dyspnea on exertion but is not  hypoxic. He is hemodynamically stable. -Admit to telemetry -Transfuse 2 units of packed red blood cells -Serial CBCs -Give low dose Lasix between units of packed red blood cells -1 unit of fresh frozen plasma -reversal of Coumadin protocol  2. Elevated INR related to Coumadin use. Patient has a history of A. Fib. He is not taking Coumadin 3 days. -Coumadin reversal protocol -Vitamin K -Fresh frozen plasma -Monitor closely -Continue to  hold Coumadin  #3. Hematoma. Right flank hip leg related to a fall 3 days ago. Chart review indicates after the fall patient experienced right hip pain. CT at that time revealed a large soft tissue hematoma at the right gluteus musculature with overlying soft tissue bruising. This has progressed in 3 days. Become larger he's developed worsening pain. No evidence of compartment syndrome at time of admission. Pedal pulse in the right foot present and palpable. See #1 -We'll obtain a CT of the pelvis for evaluation/progression of hematoma -See #2 -Monitor  #4. Hyperkalemia. Related to above. Potassium is 5.8. -lasix between packed red blood cells -Hold home supplement -Monitor  #5. A. Fib.Mali vasc score 5. Home medications include digoxin/diltiazem and Coumadin. Patient is due for cardiology yearly visit next month. Chart review from last year's visit indicates rate well controlled on these medications -Hold -Continue digoxin and diltiazem  #6. Hypertension. Medications include hydrochlorothiazide lisinopril, diltiazem. His dig level is at the low end of normal. -Continue dig -Hold lisinopril for now -Hold hydrochlorothiazide -Continue diltiazem -Monitor  #. Acute kidney injury. Creatinine 1.4 on admission. -Gentle IV fluids -Hold nephrotoxins -Monitor urine output -Recheck in the morning -    DVT prophylaxis: scd  Code Status: full Family Communication: wife and daughter at bedside  Disposition Plan: home  Consults called: none    Admission status: inpatient    Radene Gunning MD [facility]  If 7PM-7AM, please contact night-coverage [email or web address] Password [facility]  07/27/2016, 5:57 PM

## 2016-07-27 NOTE — ED Triage Notes (Signed)
Pt presents with worsening R sided pain and confusion since falling on ice on Monday.  Pt seen at [facility] for same and discharged.

## 2016-07-27 NOTE — ED Notes (Signed)
MD made aware of critical Hemoglobin of 6.4

## 2016-07-27 NOTE — ED Notes (Signed)
Pt transported to CT ?

## 2016-07-27 NOTE — ED Provider Notes (Signed)
[facility] DEPT Provider Note   CSN: [id number] Arrival date & time: 07/27/16  1438     History   Chief Complaint Chief Complaint  Patient presents with  . Fall    HPI Isaac Villa is a 83 y.o. male.  HPI  Patient presents for days after a fall, now with concern for weakness, fatigue, persistent ecchymosis on his right flank. Patient had a mechanical fall, and was seen at our affiliated facility .Evaluation did not demonstrate fracture, but there was identification of a hematoma, subcutaneous, right flank. He notes that since the fall he has stopped taking Coumadin, otherwise takes his medication regularly. Over the past few days patient has had progressive weakness, inability to stand secondary to weakness, one minor fall. However, he denies any head pain, neck pain, confusion, disorientation, speech difficulty, weakness in any particular extremity. Patient is accompanied by family members who corroborate the history, and substantiate his denial of any change in mental status.   Past Medical History:  Diagnosis Date  . Atrial fibrillation (HCC)    Chronic  . BPH (benign prostatic hypertrophy)   . Edema extremities 06/04/2014  . Elevated cholesterol   . Glaucoma   . Gout   . Hernia   . Herniated disc   . Hypertension   . Melanoma ([place])   . Melanoma ([place])   . Thrombocytopenia [facility])     Patient Active Problem List   Diagnosis Date Noted  . Atrial fibrillation ([place]) [I48.91] 02/08/2016  . Long term (current) use of anticoagulants [Z79.01] 02/08/2016  . Edema extremities 06/04/2014  . Chronic anticoagulation 06/04/2013  . Thrombocytopenia ([place])   . Chronic atrial fibrillation ([place]) 05/30/2013  . Essential hypertension, benign 05/30/2013    Past Surgical History:  Procedure Laterality Date  . APPENDECTOMY    . CATARACT EXTRACTION Bilateral 04/2008  . EYE SURGERY     lens replacements  . HERNIA REPAIR    . JOINT REPLACEMENT     left knee  . [place] SURGERY  07/2004  . REPLACEMENT TOTAL KNEE Left 11/2006  . TONSILLECTOMY         Home Medications    Prior to Admission medications   Medication Sig Start Date End Date Taking? Authorizing Provider  acetaminophen (TYLENOL) 650 MG CR tablet Take 1,300 mg by mouth every 8 (eight) hours as needed for pain.   Yes Historical Provider, MD  allopurinol (ZYLOPRIM) 300 MG tablet Take 300 mg by mouth daily at 12 noon.    Yes Historical Provider, MD  atorvastatin (LIPITOR) 10 MG tablet Take 10 mg by mouth at bedtime.   Yes Historical Provider, MD  Calcium Carb-Cholecalciferol (CALCIUM + D3) 600-200 MG-UNIT TABS Take 1 tablet by mouth daily at 12 noon.    Yes Historical Provider, MD  digoxin (LANOXIN) 0.125 MG tablet Take 0.125 mg by mouth daily.   Yes Historical Provider, MD  diltiazem (CARDIZEM CD) 240 MG 24 hr capsule Take 240 mg by mouth daily at 12 noon.   Yes Historical Provider, MD  finasteride (PROSCAR) 5 MG tablet Take 5 mg by mouth at bedtime.    Yes Historical Provider, MD  hydrochlorothiazide (HYDRODIURIL) 25 MG tablet Take 25 mg by mouth daily.   Yes Historical Provider, MD  HYDROcodone-acetaminophen (NORCO/VICODIN) 5-325 MG tablet Take 1 tablet by mouth every 6 (six) hours as needed for moderate pain.   Yes Historical Provider, MD  latanoprost (XALATAN) 0.005 % ophthalmic solution Place 1 drop into both eyes at bedtime.  Yes Historical Provider, MD  lisinopril (PRINIVIL,ZESTRIL) 10 MG tablet Take 1 tablet (10 mg total) by mouth daily. 12/23/14  Yes Sueanne Margarita, MD  magnesium oxide (MAGNESIUM-OXIDE) 400 (241.3 Mg) MG tablet Take 400 mg by mouth daily at 12 noon.   Yes Historical Provider, MD  Multiple Vitamin (MULTIVITAMIN WITH MINERALS) TABS tablet Take 1 tablet by mouth daily at 12 noon.   Yes Historical Provider, MD  omega-3 acid ethyl esters (LOVAZA) 1 g capsule Take 1 g by mouth daily at 12 noon.   Yes Historical Provider, MD  potassium chloride SA (K-DUR,KLOR-CON) 20 MEQ  tablet Take 20 mEq by mouth 2 (two) times daily.   Yes Historical Provider, MD  tamsulosin (FLOMAX) 0.4 MG CAPS capsule Take 0.4 mg by mouth daily.   Yes Historical Provider, MD  warfarin (COUMADIN) 4 MG tablet Take 4 mg by mouth daily.   Yes Historical Provider, MD    Family History Family History  Problem Relation Age of Onset  . Hypertension Mother   . Prostate cancer Father     Social History Social History  Substance Use Topics  . Smoking status: Former Research scientist (life sciences)  . Smokeless tobacco: Never Used  . Alcohol use Yes     Comment: 3-4 glasses of wine per week     Allergies   Enoxaparin and Penicillins   Review of Systems Review of Systems  Constitutional: Positive for fatigue.       Per HPI, otherwise negative  HENT:       Per HPI, otherwise negative  Respiratory:       Per HPI, otherwise negative  Cardiovascular:       Per HPI, otherwise negative  Gastrointestinal: Negative for vomiting.  Endocrine:       Negative aside from HPI  Genitourinary:       Neg aside from HPI   Musculoskeletal:       Per HPI, otherwise negative  Skin: Negative.   Neurological: Positive for weakness. Negative for syncope.  Hematological: Bruises/bleeds easily.     Physical Exam Updated Vital Signs BP 126/55   Pulse 79   Temp 98.4 F (36.9 C) (Oral)   Resp 21   SpO2 99%   Physical Exam  Constitutional: He is oriented to person, place, and time. No distress.  Sickly appearing elderly male awake and alert, answering questions appropriately  HENT:  Head: Normocephalic and atraumatic.  Eyes: Conjunctivae and EOM are normal.  Cardiovascular: Normal rate and regular rhythm.   Pulmonary/Chest: Effort normal. No stridor. No respiratory distress.  Abdominal: He exhibits no distension.  Substantial ecchymosis throughout the right flank, right superior gluteal area, with induration, but no fluctuance, no active bleeding  Musculoskeletal: He exhibits no edema.  Patient can flex each hip  independently, spontaneously, but this hasn't to do so on the right side secondary to pain in the back  Neurological: He is alert and oriented to person, place, and time.  Skin: Skin is warm and dry.  Ecchymosis throughout the right flank, per abdominal exam notes.  Several other smaller areas of ecchymosis throughout the habitus.  Psychiatric: He has a normal mood and affect.  Nursing note and vitals reviewed.    ED Treatments / Results  Labs (all labs ordered are listed, but only abnormal results are displayed) Labs Reviewed  COMPREHENSIVE METABOLIC PANEL - Abnormal; Notable for the following:       Result Value   Potassium 5.9 (*)    Glucose, Bld 141 (*)  BUN 52 (*)    Creatinine, Ser 1.41 (*)    Calcium 8.4 (*)    Total Protein 5.0 (*)    Albumin 2.8 (*)    ALT 11 (*)    GFR calc non Af Amer 43 (*)    GFR calc Af Amer 49 (*)    All other components within normal limits  CBC WITH DIFFERENTIAL/PLATELET - Abnormal; Notable for the following:    WBC 14.9 (*)    RBC 2.15 (*)    Hemoglobin 6.4 (*)    HCT 19.9 (*)    Neutro Abs 12.7 (*)    Monocytes Absolute 1.2 (*)    All other components within normal limits  PROTIME-INR - Abnormal; Notable for the following:    Prothrombin Time 26.5 (*)    All other components within normal limits  DIGOXIN LEVEL  TYPE AND SCREEN  PREPARE RBC (CROSSMATCH)    EKG  EKG Interpretation  Date/Time:  Thursday July 27 2016 14:43:53 EST Ventricular Rate:  72 PR Interval:    QRS Duration: 149 QT Interval:  404 QTC Calculation: 443 R Axis:   -75 Text Interpretation:  Atrial fibrillation Non-specific intra-ventricular conduction delay ST-t wave abnormality No significant change since last tracing Abnormal ekg Confirmed by Carmin Muskrat  MD [phone]) on 07/27/2016 2:53:21 PM       Radiology X-ray, CT results from earlier this week reviewed, notable for demonstration of hematoma, no fractures.   Procedures Procedures (including  critical care time)  Medications Ordered in ED Medications  0.9 %  sodium chloride infusion (not administered)  sodium chloride 0.9 % bolus 500 mL (not administered)   Chart review notable for evaluation earlier this week following initial fall. No demonstration of fracture, but the patient was noted to have hematoma, and per report received vitamin K. Family does not recall that the patient actually received this medication.  Initial Impression / Assessment and Plan / ED Course  I have reviewed the triage vital signs and the nursing notes.  Pertinent labs & imaging results that were available during my care of the patient were reviewed by me and considered in my medical decision making (see chart for details).  4:51 PM Patient and family aware of all findings, including hyperK, anemia, INR, AKI.  IVF starting, and blood ordered.  Patient presents several days of mechanical fall without aggressive weakness. Patient has had no additional significant, has no external weakness, no confusion, there is low suspicion for intracranial bleed. However, there is some suspicion for ongoing progression of hematoma in the right flank given the patient's supratherapeutic INR after cessation of Coumadin, as well as his declining hemoglobin. Patient is hemodynamically consistent, but given concern for some tobacco anemia, ongoing bleed, the patient will require blood transfusion.  Additionally, the patient's fund of acute kidney injury, hyperkalemia, and with the aforementioned  elevated INR, patient will require medication reconciliation after receiving his blood transfusion. Final  Clinical Impressions(s) / ED Diagnoses  Hematoma Symptomatic anemia Acute kidney injury Hyperkalemia  CRITICAL CARE Performed by: Carmin Muskrat Total critical care time: 40 minutes Critical care time was exclusive of separately billable procedures and treating other patients. Critical care was necessary to treat  or prevent imminent or life-threatening deterioration. Critical care was time spent personally by me on the following activities: development of treatment plan with patient and/or surrogate as well as nursing, discussions with consultants, evaluation of patient's response to treatment, examination of patient, obtaining history from patient or surrogate, ordering and  performing treatments and interventions, ordering and review of laboratory studies, ordering and review of radiographic studies, pulse oximetry and re-evaluation of patient's condition.    Carmin Muskrat, MD 07/27/16 [phone]

## 2016-07-27 NOTE — Progress Notes (Signed)
received report from  [employer]

## 2016-07-28 ENCOUNTER — Encounter (HOSPITAL_COMMUNITY): Payer: Self-pay | Admitting: *Deleted

## 2016-07-28 DIAGNOSIS — D649 Anemia, unspecified: Secondary | ICD-10-CM

## 2016-07-28 DIAGNOSIS — I10 Essential (primary) hypertension: Secondary | ICD-10-CM

## 2016-07-28 DIAGNOSIS — R531 Weakness: Secondary | ICD-10-CM

## 2016-07-28 DIAGNOSIS — I480 Paroxysmal atrial fibrillation: Secondary | ICD-10-CM

## 2016-07-28 DIAGNOSIS — S7011XD Contusion of right thigh, subsequent encounter: Secondary | ICD-10-CM

## 2016-07-28 LAB — CBC WITH DIFFERENTIAL/PLATELET
BASOS ABS: 0 10*3/uL (ref 0.0–0.1)
BASOS PCT: 0 %
EOS PCT: 0 %
Eosinophils Absolute: 0 10*3/uL (ref 0.0–0.7)
HCT: 22.4 % — ABNORMAL LOW (ref 39.0–52.0)
Hemoglobin: 7.3 g/dL — ABNORMAL LOW (ref 13.0–17.0)
Lymphocytes Relative: 10 %
Lymphs Abs: 1.2 10*3/uL (ref 0.7–4.0)
MCH: 29.8 pg (ref 26.0–34.0)
MCHC: 32.6 g/dL (ref 30.0–36.0)
MCV: 91.4 fL (ref 78.0–100.0)
MONO ABS: 1.2 10*3/uL — AB (ref 0.1–1.0)
Monocytes Relative: 10 %
NEUTROS ABS: 9.5 10*3/uL — AB (ref 1.7–7.7)
Neutrophils Relative %: 80 %
Platelets: 131 10*3/uL — ABNORMAL LOW (ref 150–400)
RBC: 2.45 MIL/uL — ABNORMAL LOW (ref 4.22–5.81)
RDW: 14.8 % (ref 11.5–15.5)
WBC: 11.8 10*3/uL — AB (ref 4.0–10.5)

## 2016-07-28 LAB — BASIC METABOLIC PANEL
Anion gap: 6 (ref 5–15)
BUN: 41 mg/dL — AB (ref 6–20)
CHLORIDE: 107 mmol/L (ref 101–111)
CO2: 25 mmol/L (ref 22–32)
CREATININE: 1.21 mg/dL (ref 0.61–1.24)
Calcium: 8.3 mg/dL — ABNORMAL LOW (ref 8.9–10.3)
GFR calc Af Amer: 59 mL/min — ABNORMAL LOW (ref >60)
GFR calc non Af Amer: 51 mL/min — ABNORMAL LOW (ref >60)
Glucose, Bld: 109 mg/dL — ABNORMAL HIGH (ref 65–99)
POTASSIUM: 4.4 mmol/L (ref 3.5–5.1)
Sodium: 138 mmol/L (ref 135–145)

## 2016-07-28 LAB — PROTIME-INR
INR: 1.34
Prothrombin Time: 16.7 seconds — ABNORMAL HIGH (ref 11.4–15.2)

## 2016-07-28 LAB — PREPARE RBC (CROSSMATCH)

## 2016-07-28 LAB — GLUCOSE, CAPILLARY
GLUCOSE-CAPILLARY: 103 mg/dL — AB (ref 65–99)
Glucose-Capillary: 101 mg/dL — ABNORMAL HIGH (ref 65–99)
Glucose-Capillary: 145 mg/dL — ABNORMAL HIGH (ref 65–99)
Glucose-Capillary: 162 mg/dL — ABNORMAL HIGH (ref 65–99)

## 2016-07-28 MED ORDER — ORAL CARE MOUTH RINSE
15.0000 mL | Freq: Two times a day (BID) | OROMUCOSAL | Status: DC
  Administered 2016-07-28 – 2016-08-01 (×8): 15 mL via OROMUCOSAL

## 2016-07-28 MED ORDER — ALPRAZOLAM 0.25 MG PO TABS
0.2500 mg | ORAL_TABLET | Freq: Two times a day (BID) | ORAL | Status: DC | PRN
  Administered 2016-07-29: 0.25 mg via ORAL
  Filled 2016-07-28: qty 1

## 2016-07-28 MED ORDER — ZOLPIDEM TARTRATE 5 MG PO TABS
5.0000 mg | ORAL_TABLET | Freq: Every evening | ORAL | Status: DC | PRN
  Administered 2016-07-28 – 2016-07-29 (×2): 5 mg via ORAL
  Filled 2016-07-28 (×2): qty 1

## 2016-07-28 MED ORDER — SODIUM CHLORIDE 0.9 % IV SOLN
Freq: Once | INTRAVENOUS | Status: AC
  Administered 2016-07-28: 14:00:00 via INTRAVENOUS

## 2016-07-28 MED ORDER — BOOST PLUS PO LIQD
237.0000 mL | Freq: Three times a day (TID) | ORAL | Status: DC
  Administered 2016-07-28 – 2016-08-01 (×8): 237 mL via ORAL
  Filled 2016-07-28 (×23): qty 237

## 2016-07-28 MED ORDER — ENSURE ENLIVE PO LIQD: 237.0000 mL | Freq: Three times a day (TID) | ORAL | Status: DC

## 2016-07-28 NOTE — Progress Notes (Signed)
Brief Nutrition Follow-Up Note  Received page from RN. Pt prefers Boost supplements; RD will order Boost Plus TID, each supplement provides 360 kcals and 16 grams protein.   RD will continue to follow.   Isaac Villa A. Jimmye Norman, RD, LDN, CDE Pager: [phone] After hours Pager: [phone]

## 2016-07-28 NOTE — Evaluation (Signed)
Physical Therapy Evaluation Patient Details Name: Isaac Villa MRN: [id number] DOB: [birth year] Today's Date: 07/28/2016   History of Present Illness  Isaac Villa is a delightful 83 y.o. male with medical history significant for hypertension, BPH, A. fib on Coumadin, presents to the emergency department with the chief complaint of worsening generalized weakness shortness of breath dizziness with position change. Initial evaluation reveals hg 6.4, potassium 5.9 worsening ecchymosis right flank  Clinical Impression  Pt admitted with above diagnosis. Pt currently with functional limitations due to the deficits listed below (see PT Problem List). Pt very fatigued with all activity in and out of bed as well as considerable right flank pain that is limiting motion. Min A needed to get to EOB and pt could tolerate only 10' ambulation with RW before needing to sit. Admitted to mild dizziness when up which may have also contributed to his recent falling.  Pt will benefit from skilled PT to increase their independence and safety with mobility to allow discharge to the venue listed below.       Follow Up Recommendations SNF;Supervision/Assistance - 24 hour    Equipment Recommendations  Rolling walker with 5" wheels    Recommendations for Other Services       Precautions / Restrictions Precautions Precautions: Fall Precaution Comments: pt fell on ice on Monday and then fell getting out of bed a couple of days later Restrictions Weight Bearing Restrictions: No      Mobility  Bed Mobility Overal bed mobility: Needs Assistance Bed Mobility: Rolling;Sidelying to Sit Rolling: Min guard Sidelying to sit: Min assist       General bed mobility comments: pt has been unable to sit up EOB previously this morning. On eval, able to bridge knees and slide to [facility]. vc's for rolling to left side and sliding legs off bed and min A for trunk elevation. Pt had difficulty maintaining sitting due to left  flank pain, heavy lean to right  Transfers Overall transfer level: Needs assistance Equipment used: Rolling walker (2 wheeled) Transfers: Sit to/from Stand Sit to Stand: Min assist         General transfer comment: min A for support, pt very fatigued  Ambulation/Gait Ambulation/Gait assistance: Min assist Ambulation Distance (Feet): 12 Feet Assistive device: Rolling walker (2 wheeled) Gait Pattern/deviations: Step-through pattern;Shuffle;Decreased stride length Gait velocity: decreased Gait velocity interpretation: Below normal speed for age/gender General Gait Details: shuflling steps, pt with increased right flank pain, noted some dizziness as well  Stairs            Wheelchair Mobility    Modified Rankin (Stroke Patients Only)       Balance Overall balance assessment: Needs assistance;History of Falls Sitting-balance support: Single extremity supported Sitting balance-Leahy Scale: Poor Sitting balance - Comments: very uncomfortable EOB, left lean Postural control: Left lateral lean Standing balance support: Bilateral upper extremity supported Standing balance-Leahy Scale: Poor                               Pertinent Vitals/Pain Pain Assessment: Faces Faces Pain Scale: Hurts even more Pain Location: right flank Pain Descriptors / Indicators: Sore Pain Intervention(s): Limited activity within patient's tolerance;Monitored during session;Premedicated before session    Home Living Family/patient expects to be discharged to:: Private residence Living Arrangements: Spouse/significant other Available Help at Discharge: Family;Available 24 hours/day Type of Home: House Home Access: Stairs to enter Entrance Stairs-Rails: Right Entrance Stairs-Number of Steps: 4 Home  Layout: Multi-level;Able to live on main level with bedroom/bathroom Home Equipment: Gilford Rile - 2 wheels;Other (comment) (lift chair) Additional Comments: pt's wife is elderly as well  and can provide supervision but not physical assistance    Prior Function Level of Independence: Independent         Comments: was driving and ambulating without AD before last week, though daughters note that he has begin having anxitety and dementia     Hand Dominance        Extremity/Trunk Assessment   Upper Extremity Assessment Upper Extremity Assessment: Generalized weakness    Lower Extremity Assessment Lower Extremity Assessment: Generalized weakness    Cervical / Trunk Assessment Cervical / Trunk Assessment: Kyphotic  Communication   Communication: No difficulties  Cognition Arousal/Alertness: Awake/alert Behavior During Therapy: Restless Overall Cognitive Status: Impaired/Different from baseline Area of Impairment: Attention;Problem solving;Following commands   Current Attention Level: Selective Memory: Decreased short-term memory Following Commands: Follows one step commands consistently;Follows multi-step commands with increased time     Problem Solving: Slow processing;Requires verbal cues General Comments: perseverates on certain topics and is overall quite anxious. Seems to know that he is forgetting things and is trying to cover up    General Comments General comments (skin integrity, edema, etc.): O2 sats 97%, HR 60's    Exercises General Exercises - Lower Extremity Ankle Circles/Pumps: AROM;Both;10 reps;Supine Heel Slides: AROM;Both;10 reps;Supine   Assessment/Plan    PT Assessment Patient needs continued PT services  PT Problem List Decreased strength;Decreased activity tolerance;Decreased balance;Decreased mobility;Decreased cognition;Decreased knowledge of use of DME;Decreased knowledge of precautions;Pain          PT Treatment Interventions DME instruction;Gait training;Functional mobility training;Therapeutic activities;Therapeutic exercise;Balance training;Patient/family education    PT Goals (Current goals can be found in the Care  Plan section)  Acute Rehab PT Goals Patient Stated Goal: return home PT Goal Formulation: With patient/family Time For Goal Achievement: 08/11/16 Potential to Achieve Goals: Fair    Frequency Min 3X/week   Barriers to discharge Decreased caregiver support      Co-evaluation               End of Session Equipment Utilized During Treatment: Gait belt Activity Tolerance: Patient limited by fatigue;Patient limited by pain Patient left: in chair;with call bell/phone within reach;with family/visitor present Nurse Communication: Mobility status         Time: [id number] PT Time Calculation (min) (ACUTE ONLY): 46 min   Charges:   PT Evaluation $PT Eval Moderate Complexity: 1 Procedure PT Treatments $Gait Training: 8-22 mins $Therapeutic Activity: 8-22 mins   PT G Codes:      Leighton Roach, PT  Acute Rehab Services  [place] 07/28/2016, 4:20 PM

## 2016-07-28 NOTE — Progress Notes (Signed)
[facility]  Patient Status: Active (receiving services up to time of hospitalization)  [facility] is providing the following services: PT  If patient discharges after hours, please call [phone].   Janae Sauce 07/28/2016, 10:55 AM

## 2016-07-28 NOTE — Progress Notes (Signed)
OT Cancellation Note  Patient Details Name: Isaac Villa MRN: [id number] DOB: [birth year]   Cancelled Treatment:    Reason Eval/Treat Not Completed: Other (comment) (PT in with patient). Will check back as schedule allows for OT eval/treat. Thank you for the order.  Chrys Racer , MS, OTR/L, CLT  07/28/2016, 1:57 PM

## 2016-07-28 NOTE — Progress Notes (Signed)
PROGRESS NOTE                                                                                                                                                                                                             Patient Demographics:    Isaac Villa, is a 83 y.o. male, DOB - 26-Jan-1925, [id number]  Admit date - 07/27/2016   Admitting Physician Waldemar Dickens, MD  Outpatient Primary MD for the patient is [name], MD  LOS - 1  Outpatient Specialists:Dr Argyle  Chief Complaint  Patient presents with  . Fall       Brief Narrative   83 y.o. male with medical history significant for hypertension, BPH, A. fib on Coumadin, presents to the emergency department with the chief complaint of worsening generalized weakness shortness of breath dizziness with position change. Pt slipped on ice about 3 days back and landed on his right hip. Initial evaluation reveals hg 6.4, potassium 5.9 worsening ecchymosis right flank. CT showed large hematoma over rt thigh. Ordered for 2 u PRBC, FFP and admitted to medical floor.   Subjective:   C/o rt thigh pain and lack of sleep   Assessment  & Plan :    Principal Problem:   Symptomatic anemia secondary to large thigh  Hematoma from fall with therapeutic INR.  Received 2 u PRBC and FFP with mild improvement in hb. Ordered another unit PRBC. Monitor h&H closely. Hold coumadin for now.     Active Problems:  Right thigh hematoma Secondary to mechanical fall. Wife denies frequent falls at home. Hold coumadin. Pain control and PT.     Essential hypertension, benign Resume home meds. Stable. Holding lisinopril for hyperkalemia.     Atrial fibrillation ([place]) [I48.91] On chronic coumadin which is held and INR reversed for hematoma. Monitor for further bleeding. continue digoxin and cardizem.   Hyperkalemia Improved.   AKI Mild on admission. ACEi held. Now  improved.  generalized Weakness  seen by PT and recommend.SNF. Patient and family agree   Discussed plan with patient's PCP on holding coumadin until hematoma improved and no further bleeding and need for SNF.   Code Status : full code  Family Communication  : wife and son at bedside  Disposition Plan  : SNF  Barriers For Discharge : active symptoms  Consults  : none  Procedures  : CT pelvis  DVT Prophylaxis  :  SCD  Lab Results  Component Value Date   PLT 131 (L) 07/28/2016    Antibiotics  :   Anti-infectives    None        Objective:   Vitals:   07/28/16 1200 07/28/16 1439 07/28/16 1518 07/28/16 1543  BP: (!) 123/48 (!) 120/50 (!) 123/47 (!) 120/47  Pulse:  72 79 71  Resp:  16  20  Temp:  98.3 F (36.8 C) 98.3 F (36.8 C) 98 F (36.7 C)  TempSrc:  Oral Oral Oral  SpO2:  99% 99%   Weight:      Height:        Wt Readings from Last 3 Encounters:  07/27/16 78 kg (172 lb)  06/14/16 77.1 kg (170 lb)  08/31/15 77.5 kg (170 lb 12.8 oz)     Intake/Output Summary (Last 24 hours) at 07/28/16 1641 Last data filed at 07/28/16 1528  Gross per 24 hour  Intake             1519 ml  Output             1230 ml  Net              289 ml     Physical Exam  Gen: not in distress HEENT:moist mucosa, supple neck Chest: clear b/l, no added sounds CVS: s1&s2 regular , no murmurs GI: soft, NT, ND,  Musculoskeletal: large hematoma over posterior thigh CNS: Alert and oriented    Data Review:    CBC  Recent Labs Lab 07/27/16 1541 07/28/16 0806  WBC 14.9* 11.8*  HGB 6.4* 7.3*  HCT 19.9* 22.4*  PLT 181 131*  MCV 92.6 91.4  MCH 29.8 29.8  MCHC 32.2 32.6  RDW 14.8 14.8  LYMPHSABS 1.0 1.2  MONOABS 1.2* 1.2*  EOSABS 0.0 0.0  BASOSABS 0.0 0.0    Chemistries   Recent Labs Lab 07/27/16 1541 07/28/16 0806  NA 135 138  K 5.9* 4.4  CL 103 107  CO2 25 25  GLUCOSE 141* 109*  BUN 52* 41*  CREATININE 1.41* 1.21  CALCIUM 8.4* 8.3*  AST 37  --     ALT 11*  --   ALKPHOS 54  --   BILITOT 0.9  --    ------------------------------------------------------------------------------------------------------------------ No results for input(s): CHOL, HDL, LDLCALC, TRIG, CHOLHDL, LDLDIRECT in the last 72 hours.  No results found for: HGBA1C ------------------------------------------------------------------------------------------------------------------ No results for input(s): TSH, T4TOTAL, T3FREE, THYROIDAB in the last 72 hours.  Invalid input(s): FREET3 ------------------------------------------------------------------------------------------------------------------ No results for input(s): VITAMINB12, FOLATE, FERRITIN, TIBC, IRON, RETICCTPCT in the last 72 hours.  Coagulation profile  Recent Labs Lab 07/27/16 1541 07/28/16 0806  INR 2.39 1.34    No results for input(s): DDIMER in the last 72 hours.  Cardiac Enzymes No results for input(s): CKMB, TROPONINI, MYOGLOBIN in the last 168 hours.  Invalid input(s): CK ------------------------------------------------------------------------------------------------------------------ No results found for: BNP  Inpatient Medications  Scheduled Meds: . sodium chloride  10 mL/hr Intravenous Once  . sodium chloride   Intravenous Once  . sodium chloride   Intravenous Once  . allopurinol  300 mg Oral Q1200  . atorvastatin  10 mg Oral QHS  . digoxin  0.125 mg Oral Daily  . diltiazem  240 mg Oral Q1200  . docusate sodium  100 mg Oral BID  . finasteride  5 mg Oral QHS  . insulin aspart  0-9 Units Subcutaneous TID WC  .  lactose free nutrition  237 mL Oral TID BM  . latanoprost  1 drop Both Eyes QHS  . magnesium oxide  400 mg Oral Q1200  . mouth rinse  15 mL Mouth Rinse BID  . sodium chloride flush  3 mL Intravenous Q12H  . tamsulosin  0.4 mg Oral Daily   Continuous Infusions: PRN Meds:.acetaminophen **OR** acetaminophen, ALPRAZolam, HYDROcodone-acetaminophen, ondansetron **OR**  ondansetron (ZOFRAN) IV, zolpidem  Micro Results No results found for this or any previous visit (from the past 240 hour(s)).  Radiology Reports Ct Head Wo Contrast  Result Date: 07/24/2016 CLINICAL DATA:  Recent slip and fall with head injury and blood thinner usage, initial encounter EXAM: CT HEAD WITHOUT CONTRAST TECHNIQUE: Contiguous axial images were obtained from the base of the skull through the vertex without intravenous contrast. COMPARISON:  None. FINDINGS: Brain: Diffuse atrophic changes and chronic white matter ischemic changes noted. No findings to suggest acute hemorrhage, acute infarction or space-occupying mass lesion are noted. Vascular: No hyperdense vessel or unexpected calcification. Skull: Normal. Negative for fracture or focal lesion. Sinuses/Orbits: No acute finding. Other: None. IMPRESSION: Atrophic changes and chronic white matter ischemic change. No acute abnormality is noted. Electronically Signed   By: Inez Catalina M.D.   On: 07/24/2016 16:27   Ct Pelvis Wo Contrast  Result Date: 07/27/2016 CLINICAL DATA:  Right thigh and buttock hematoma following a fall on ice 3 days ago. EXAM: CT PELVIS WITHOUT CONTRAST TECHNIQUE: Multidetector CT imaging of the pelvis was performed following the standard protocol without intravenous contrast. COMPARISON:  07/24/2016. FINDINGS: Urinary Tract: Stable partially included right renal cyst and left renal complex cyst or solid mass. Enlarged prostate gland protruding into the base of the urinary bladder with associated mild diffuse bladder wall thickening. Bowel: Extensive sigmoid colon diverticulosis. No dilated bowel loops. Vascular/Lymphatic: Atheromatous arterial calcifications. Reproductive: Stable moderately to markedly enlarged prostate gland with mild heterogeneity. Other: Proximal left inguinal hernia containing fat and a portion of the sigmoid colon without obstruction. Progressive diffuse subcutaneous edema on the right. The previously  demonstrated 10.2 x 6.0 cm right gluteus muscle hematoma currently measures 9.9 x 6.5 cm. Musculoskeletal: Extensive lower lumbar spine facet degenerative changes and right L4 pars defect with associated grade 1 anterolisthesis at the L4-5 level. Mild right hip degenerative changes. Minimal left hip degenerative changes. No fracture or dislocation. IMPRESSION: 1. No fracture or dislocation. 2. Stable right gluteus muscle hematoma with increased associated subcutaneous edema. 3. Proximal left inguinal hernia containing fat and a small portion of the sigmoid colon without obstruction. 4. Stable moderately to markedly enlarged prostate gland with mild heterogeneity. 5. Mild diffuse bladder wall thickening compatible with chronic bladder outlet obstruction by the enlarged prostate gland. 6. Stable partially included left renal complex cyst or solid mass. This could be further evaluated with a pre and postcontrast CT or MRI if clinically indicated. 7. Lower lumbar spine degenerative changes and right L4 pars defect with associated grade 1 anterolisthesis at the L4-5 level. Electronically Signed   By: Claudie Revering M.D.   On: 07/27/2016 18:24   Ct Pelvis Wo Contrast  Result Date: 07/24/2016 CLINICAL DATA:  Slipped on ice at [employer], with acute onset of right hip pain. Initial encounter. EXAM: CT PELVIS WITHOUT CONTRAST TECHNIQUE: Multidetector CT imaging of the pelvis was performed following the standard protocol without intravenous contrast. COMPARISON:  None. FINDINGS: Urinary Tract: The bladder is mildly distended and grossly unremarkable in appearance, with impression on the base of bladder from the patient's  significantly enlarged prostate. Bilateral renal cysts are seen, including a somewhat complex 4.9 cm left renal cyst with a slightly thickened wall and peripheral calcification. As this has very gradually grown from 1.8 cm in 2009, renal ultrasound or CT with contrast would be helpful for further  evaluation, as deemed clinically appropriate. Bowel: Scattered diverticulosis is noted along the visualized portions of the colon, without evidence of diverticulitis. Vascular/Lymphatic: Scattered calcification is seen along the distal abdominal aorta and its branches. No inguinal or pelvic sidewall lymphadenopathy is seen. Enlarged retroperitoneal nodes measure up to 1.3 cm in short axis, raising question for underlying malignancy. Reproductive: The prostate is significantly enlarged, measuring 6.0 cm in transverse dimension, with minimal calcification. Other: There is a large soft tissue hematoma within the right gluteus musculature, with overlying bruising. The hematoma measures nearly 14 x 10 x 6 cm in size. Musculoskeletal: There is no definite evidence of fracture. Mild degenerative change is noted at the lower lumbar spine, with underlying chronic right-sided pars defect at L4, and mild grade 1 anterolisthesis of L4 on L5. The remainder of the visualized musculature is grossly unremarkable in appearance. IMPRESSION: 1. No definite evidence of fracture at this time. 2. Large soft tissue hematoma at the right gluteus musculature, with overlying soft tissue bruising. The hematoma measures nearly 14 x 10 x 6 cm in size. 3. Enlarged retroperitoneal nodes, measuring up to 1.3 cm in short axis, raising question for underlying malignancy. Further evaluation could be considered as deemed clinically appropriate. 4. Significantly enlarged prostate.  Would correlate with PSA. 5. Somewhat complex 4.9 cm left renal cystic lesion demonstrates slightly thickened wall and peripheral calcification, and has very gradually grown from 1.8 cm in 2009. Renal ultrasound or CT with contrast would be helpful for further evaluation, if deemed clinically appropriate. 6. Chronic right-sided pars defect at L4, with mild grade 1 anterolisthesis of L4 on L5. Electronically Signed   By: Garald Balding M.D.   On: 07/24/2016 16:40   Dg Hip  Unilat  With Pelvis 2-3 Views Right  Result Date: 07/24/2016 CLINICAL DATA:  Right hip pain after a fall today. Initial encounter. EXAM: DG HIP (WITH OR WITHOUT PELVIS) 2-3V RIGHT COMPARISON:  None. FINDINGS: Osteopenia. No fracture. Minimal collar osteophytosis along the right femoral head and minimal subchondral sclerosis in the right acetabulum. IMPRESSION: 1. No fracture or dislocation. 2. Minimal degenerative change in the right hip. Electronically Signed   By: Lorin Picket M.D.   On: 07/24/2016 13:51    Time Spent in minutes  25   Louellen Molder M.D on 07/28/2016 at 4:41 PM  Between 7am to 7pm - Pager - [phone]  After 7pm go to [email or web address] - password [facility]  [facility] -  Office  [phone]

## 2016-07-28 NOTE — Progress Notes (Signed)
Initial Nutrition Assessment  INTERVENTION:  Ensure Enlive TID, each supplement will provide 350 calories and 20 grams protein.   NUTRITION DIAGNOSIS:   Inadequate oral intake related to poor appetite as evidenced by per patient/family report, estimated needs.  GOAL:   Patient will meet greater than or equal to 90% of their needs  MONITOR:   PO intake, Supplement acceptance, Labs, Weight trends, Skin, I & O's  REASON FOR ASSESSMENT:   Malnutrition Screening Tool    ASSESSMENT:   83 y.o. male with medical history significant for hypertension, BPH, A. fib on Coumadin, presents to the emergency department with the chief complaint of worsening generalized weakness shortness of breath dizziness with position change.   Pt reported a decrease in appetite and strength after his fall last week. History gathered from pt and family member at bedside (wife). Pt's wife reports pt has been confused and not eating. Pt reports prior to fall, he had a good appetite but is "very health conscious". Pt wife reported after his knee surgery he intentionally lost weight.     Nutrition Focused Physical Exam showed mild to moderate muscle and fat depletion and no edema. This could be related to his age and mobility at baseline. Pt wife reports that pt has been using a walker to get around. Furthermore, weight history showed pretty stable weights ranging around 170 lbs.   Discussed importance of calories and protein for regaining weight and strength. Pt reported drinking Boost PTA. We brought a chocolate Ensure for the pt to sample. Pt expressed likeablility and is seemingly willing to continue nutritional supplementation of Ensure Enlive. Pt wife is very encouraging of increasing his PO intake. She also stated they bought nutritional supplements for him to continue to consume at home after discharge.   Pt is on coumadin and is seemingly knowledgeable on vitamin K consumption.  Diet Order:  Diet Heart Room  service appropriate? Yes; Fluid consistency: Thin  Skin:  Reviewed, no issues  Last BM:  1/24  Height:   Ht Readings from Last 1 Encounters:  07/27/16 6\' 2"  (1.88 m)   Weight:   Wt Readings from Last 1 Encounters:  07/27/16 172 lb (78 kg)   Ideal Body Weight:  86.4 kg  BMI:  Body mass index is 22.08 kg/m.  Estimated Nutritional Needs:   Kcal:  1700-1900  Protein:  90-100 grams  Fluid:  1.7 - 1.9 L/d  EDUCATION NEEDS:   Education needs addressed  [facility] Dietetic Intern

## 2016-07-29 DIAGNOSIS — T796XXD Traumatic ischemia of muscle, subsequent encounter: Secondary | ICD-10-CM

## 2016-07-29 DIAGNOSIS — Z7901 Long term (current) use of anticoagulants: Secondary | ICD-10-CM

## 2016-07-29 LAB — PREPARE FRESH FROZEN PLASMA
BLOOD PRODUCT EXPIRATION DATE: 201801302359
ISSUE DATE / TIME: 201801260303
UNIT TYPE AND RH: 2800

## 2016-07-29 LAB — CBC
HEMATOCRIT: 23.4 % — AB (ref 39.0–52.0)
HEMOGLOBIN: 7.6 g/dL — AB (ref 13.0–17.0)
MCH: 29.6 pg (ref 26.0–34.0)
MCHC: 32.5 g/dL (ref 30.0–36.0)
MCV: 91.1 fL (ref 78.0–100.0)
Platelets: 145 10*3/uL — ABNORMAL LOW (ref 150–400)
RBC: 2.57 MIL/uL — ABNORMAL LOW (ref 4.22–5.81)
RDW: 15.7 % — AB (ref 11.5–15.5)
WBC: 11.2 10*3/uL — ABNORMAL HIGH (ref 4.0–10.5)

## 2016-07-29 LAB — CK
Total CK: 715 U/L — ABNORMAL HIGH (ref 49–397)
Total CK: 657 U/L — ABNORMAL HIGH (ref 49–397)

## 2016-07-29 LAB — TYPE AND SCREEN
BLOOD PRODUCT EXPIRATION DATE: 201802082359
BLOOD PRODUCT EXPIRATION DATE: 201802142359
BLOOD PRODUCT EXPIRATION DATE: 201802142359
ISSUE DATE / TIME: 201801252026
ISSUE DATE / TIME: 201801252348
ISSUE DATE / TIME: 201801261516
UNIT TYPE AND RH: 8400
Unit Type and Rh: 6200
Unit Type and Rh: 8400

## 2016-07-29 LAB — GLUCOSE, CAPILLARY
GLUCOSE-CAPILLARY: 127 mg/dL — AB (ref 65–99)
GLUCOSE-CAPILLARY: 127 mg/dL — AB (ref 65–99)
Glucose-Capillary: 108 mg/dL — ABNORMAL HIGH (ref 65–99)
Glucose-Capillary: 178 mg/dL — ABNORMAL HIGH (ref 65–99)

## 2016-07-29 LAB — PROTIME-INR
INR: 1.22
Prothrombin Time: 15.5 seconds — ABNORMAL HIGH (ref 11.4–15.2)

## 2016-07-29 MED ORDER — QUETIAPINE FUMARATE 25 MG PO TABS
25.0000 mg | ORAL_TABLET | Freq: Every day | ORAL | Status: DC
  Administered 2016-07-29: 25 mg via ORAL
  Filled 2016-07-29: qty 1

## 2016-07-29 MED ORDER — VITAMIN K1 10 MG/ML IJ SOLN
5.0000 mg | Freq: Once | INTRAMUSCULAR | Status: AC
  Administered 2016-07-29: 5 mg via SUBCUTANEOUS
  Filled 2016-07-29: qty 0.5

## 2016-07-29 MED ORDER — ALPRAZOLAM 0.5 MG PO TABS
0.5000 mg | ORAL_TABLET | Freq: Two times a day (BID) | ORAL | Status: DC | PRN
  Administered 2016-07-30: 0.5 mg via ORAL
  Filled 2016-07-29: qty 1

## 2016-07-29 NOTE — Clinical Social Work Placement (Signed)
   CLINICAL SOCIAL WORK PLACEMENT  NOTE  Date:  07/29/2016  Patient Details  Name: Isaac Villa MRN: [id number] Date of Birth: [birth year]  Clinical Social Work is seeking post-discharge placement for this patient at the [place] level of care (*CSW will initial, date and re-position this form in  chart as items are completed):  Yes   Patient/family provided with [facility] Work Department's list of facilities offering this level of care within the geographic area requested by the patient (or if unable, by the patient's family).  Yes   Patient/family informed of their freedom to choose among providers that offer the needed level of care, that participate in Medicare, Medicaid or managed care program needed by the patient, have an available bed and are willing to accept the patient.  Yes   Patient/family informed of [facility]'s ownership interest in [facility] and [facility], as well as of the fact that they are under no obligation to receive care at these facilities.  PASRR submitted to EDS on 07/29/16     PASRR number received on 07/29/16     Existing PASRR number confirmed on       FL2 transmitted to all facilities in geographic area requested by pt/family on 07/29/16     FL2 transmitted to all facilities within larger geographic area on       Patient informed that his/her managed care company has contracts with or will negotiate with certain facilities, including the following:            Patient/family informed of bed offers received.  Patient chooses bed at       Physician recommends and patient chooses bed at      Patient to be transferred to   on  .  Patient to be transferred to facility by       Patient family notified on   of transfer.  Name of family member notified:        PHYSICIAN Please sign FL2     Additional Comment:    _______________________________________________ Candie Chroman, LCSW 07/29/2016,  3:13 PM

## 2016-07-29 NOTE — Evaluation (Signed)
Occupational Therapy Evaluation Patient Details Name: Isaac Villa MRN: [id number] DOB: 1924/09/21 Today's Date: 07/29/2016    History of Present Illness Isaac Villa is a delightful 83 y.o. male with medical history significant for hypertension, BPH, A. fib on Coumadin, presents to the emergency department with the chief complaint of worsening generalized weakness shortness of breath dizziness with position change. Initial evaluation reveals hg 6.4, potassium 5.9 worsening ecchymosis right flank   Clinical Impression   Prior to fall last week, pt was independent with ADL and functional mobility. Pt currently requires mod-max assist with ADL and mod assist for functional toilet transfers. Pt additionally presents with highly decreased activity tolerance for ADL. He requires increased processing time for problem solving tasks and demonstrates decreased short-term memory. Due to significant functional decline from PLOF, pt would benefit from continued OT services while admitted to improve independence with and activity tolerance for ADL and functional mobility. Recommend short-term SNF placement post-acute D/C for continued rehabilitation in order to maximize PLOF. Pt and family are in agreement. OT will continue to follow acutely.     Follow Up Recommendations  SNF;Supervision/Assistance - 24 hour    Equipment Recommendations  Other (comment) (TBD at next venue of care)    Recommendations for Other Services       Precautions / Restrictions Precautions Precautions: Fall Precaution Comments: pt fell on ice on Monday and then fell getting out of bed a couple of days later Restrictions Weight Bearing Restrictions: No      Mobility Bed Mobility Overal bed mobility: Needs Assistance Bed Mobility: Rolling;Sidelying to Sit Rolling: Min guard Sidelying to sit: Min assist       General bed mobility comments: Difficulty maintaining sitting due to R flank pain.  Transfers Overall  transfer level: Needs assistance Equipment used: Rolling walker (2 wheeled) Transfers: Sit to/from Stand Sit to Stand: Min assist         General transfer comment: Min assist from elevated surface. Pt highly fatigued and reports feeling "woozy" on standing.    Balance Overall balance assessment: Needs assistance;History of Falls Sitting-balance support: Single extremity supported Sitting balance-Leahy Scale: Poor Sitting balance - Comments: very uncomfortable EOB, left lean Postural control: Left lateral lean Standing balance support: Bilateral upper extremity supported Standing balance-Leahy Scale: Poor                              ADL Overall ADL's : Needs assistance/impaired     Grooming: Minimal assistance;Sitting Grooming Details (indicate cue type and reason): Mod assist to maintain balance Upper Body Bathing: Moderate assistance;Sitting Upper Body Bathing Details (indicate cue type and reason): Mod assist to maintain balance Lower Body Bathing: Maximal assistance;Sit to/from stand   Upper Body Dressing : Moderate assistance;Sitting Upper Body Dressing Details (indicate cue type and reason): Mod assist to maintain balance Lower Body Dressing: Maximal assistance;Sit to/from stand   Toilet Transfer: Moderate assistance;RW;Ambulation;BSC Toilet Transfer Details (indicate cue type and reason): Simulated; Anxious with movement which impacts safety. Toileting- Clothing Manipulation and Hygiene: Moderate assistance;Sit to/from stand       Functional mobility during ADLs: Moderate assistance;Rolling walker;+2 for safety/equipment (Pt's daughter assisting with chair and equipment) General ADL Comments: Pt with decreased activity tolerance for ADL. Slightly confused this session and requiring multiple cues for reassurance.     Vision Vision Assessment?: No apparent visual deficits   Perception     Praxis      Pertinent Vitals/Pain Pain  Assessment:  0-10 Pain Score: 10-Worst pain ever Pain Location: right flank Pain Descriptors / Indicators: Sore Pain Intervention(s): Limited activity within patient's tolerance;Monitored during session;Repositioned     Hand Dominance Right   Extremity/Trunk Assessment Upper Extremity Assessment Upper Extremity Assessment: Generalized weakness   Lower Extremity Assessment Lower Extremity Assessment: Generalized weakness   Cervical / Trunk Assessment Cervical / Trunk Assessment: Kyphotic   Communication Communication Communication: No difficulties   Cognition Arousal/Alertness: Awake/alert Behavior During Therapy: Restless Overall Cognitive Status: Impaired/Different from baseline Area of Impairment: Attention;Problem solving;Following commands   Current Attention Level: Selective Memory: Decreased short-term memory Following Commands: Follows one step commands consistently;Follows multi-step commands with increased time     Problem Solving: Slow processing;Requires verbal cues General Comments: Noted forgetting things and trying to cover up, such as confusing home and hospital yesterday. Asking "is this my room?" but able to report that he is at [facility].   General Comments       Exercises       Shoulder Instructions      Home Living Family/patient expects to be discharged to:: Private residence Living Arrangements: Spouse/significant other Available Help at Discharge: Family;Available 24 hours/day Type of Home: House Home Access: Stairs to enter [employer] of Steps: 4 Entrance Stairs-Rails: Right Home Layout: Multi-level;Able to live on main level with bedroom/bathroom     Bathroom Shower/Tub: Teacher, early years/pre: Standard     Home Equipment: Environmental consultant - 2 wheels;Other (comment) (lift chair)   Additional Comments: pt's wife is elderly as well and can provide supervision but not physical assistance      Prior  Functioning/Environment Level of Independence: Independent        Comments: Per PT note: pt drives and daughters report anxiety and dementia. Pt and family report independence with ADL prior to fall on ice.        OT Problem List: Decreased strength;Decreased range of motion;Decreased activity tolerance;Impaired balance (sitting and/or standing);Decreased safety awareness;Decreased knowledge of use of DME or AE;Decreased knowledge of precautions;Pain   OT Treatment/Interventions: Therapeutic exercise;DME and/or AE instruction;Therapeutic activities;Patient/family education;Balance training;Self-care/ADL training;Energy conservation;Cognitive remediation/compensation    OT Goals(Current goals can be found in the care plan section) Acute Rehab OT Goals Patient Stated Goal: go to rehab before home OT Goal Formulation: With patient/family Time For Goal Achievement: 08/05/16 Potential to Achieve Goals: Good ADL Goals Pt Will Perform Grooming: with supervision;standing Pt Will Perform Upper Body Bathing: with supervision;sitting Pt Will Perform Lower Body Bathing: with supervision;sit to/from stand Pt Will Transfer to Toilet: with supervision;ambulating;bedside commode Pt Will Perform Toileting - Clothing Manipulation and hygiene: with supervision;sit to/from stand Pt/caregiver will Perform Home Exercise Program: Both right and left upper extremity;Increased strength;With Supervision;With written HEP provided Additional ADL Goal #1: Pt will demonstrate improved activity tolerance for ADL by standing at sink for 15 minutes for grooming tasks with no more than 1 seated rest break as a precursor to ADL independence.  OT Frequency: Min 1X/week   Barriers to D/C:            Co-evaluation              End of Session Equipment Utilized During Treatment: Gait belt;Rolling walker Nurse Communication: Other (comment) (Pt's ice back leaked onto bed.)  Activity Tolerance: Patient tolerated  treatment well Patient left: in chair;with call bell/phone within reach;with family/visitor present   Time: [id number] OT Time Calculation (min): 27 min Charges:  OT General Charges $OT Visit: 1 Procedure OT  Evaluation $OT Eval Moderate Complexity: 1 Procedure OT Treatments $Self Care/Home Management : 8-22 mins G-Codes:    Norman Herrlich, OTR/L [id number] 07/29/2016, 4:41 PM

## 2016-07-29 NOTE — NC FL2 (Signed)
[place] MEDICAID FL2 LEVEL OF CARE SCREENING TOOL     IDENTIFICATION  Patient Name: Isaac Villa Birthdate: 1924/11/13 Sex: male Admission Date (Current Location): 07/27/2016  [facility] and [place] Number:  Herbalist and Address:  [facility] [place], [place] [address] [place], [place] [address]      Provider Number: [id number]  Attending Physician Name and Address:  Louellen Molder, MD  Relative Name and Phone Number:       Current Level of Care: Hospital Recommended Level of Care: [place] Prior Approval Number:    Date Approved/Denied:   PASRR Number: [id number]  Discharge Plan: SNF    Current Diagnoses: Patient Active Problem List   Diagnosis Date Noted  . Symptomatic anemia 07/27/2016  . Generalized weakness 07/27/2016  . Hyperkalemia 07/27/2016  . Acute kidney injury ([place]) 07/27/2016  . Elevated INR 07/27/2016  . Hematoma 07/27/2016  . Atrial fibrillation ([place]) [I48.91] 02/08/2016  . Long term (current) use of anticoagulants [Z79.01] 02/08/2016  . Edema extremities 06/04/2014  . Chronic anticoagulation 06/04/2013  . Thrombocytopenia ([place])   . Chronic atrial fibrillation ([place]) 05/30/2013  . Essential hypertension, benign 05/30/2013    Orientation RESPIRATION BLADDER Height & Weight     Self, Time, Situation, Place  Normal Incontinent (At times) Weight: 172 lb (78 kg) Height:  6\' 2"  (188 cm)  BEHAVIORAL SYMPTOMS/MOOD NEUROLOGICAL BOWEL NUTRITION STATUS   (None)  (None) Continent Diet (Heart healthy)  AMBULATORY STATUS COMMUNICATION OF NEEDS Skin   Limited Assist Verbally Bruising                       Personal Care Assistance Level of Assistance  Bathing, Feeding, Dressing Bathing Assistance: Limited assistance Feeding assistance: Limited assistance Dressing Assistance: Limited assistance     Functional Limitations Info  Sight, Hearing, Speech Sight Info: Adequate Hearing Info: Adequate Speech  Info: Adequate    SPECIAL CARE FACTORS FREQUENCY  PT (By licensed PT), Blood pressure, OT (By licensed OT)     PT Frequency: 5 x week OT Frequency: 5 x week            Contractures Contractures Info: Not present    Additional Factors Info  Code Status, Allergies Code Status Info: Full Allergies Info: Enoxaparin, Penicillins           Current Medications (07/29/2016):  This is the current hospital active medication list Current Facility-Administered Medications  Medication Dose Route Frequency Provider Last Rate Last Dose  . 0.9 %  sodium chloride infusion  10 mL/hr Intravenous Once Carmin Muskrat, MD      . 0.9 %  sodium chloride infusion   Intravenous Once Radene Gunning, NP      . acetaminophen (TYLENOL) tablet 650 mg  650 mg Oral Q6H PRN Radene Gunning, NP       Or  . acetaminophen (TYLENOL) suppository 650 mg  650 mg Rectal Q6H PRN Radene Gunning, NP      . allopurinol (ZYLOPRIM) tablet 300 mg  300 mg Oral Q1200 Radene Gunning, NP   300 mg at 07/28/16 1243  . ALPRAZolam (XANAX) tablet 0.5 mg  0.5 mg Oral BID PRN Nishant Dhungel, MD      . digoxin (LANOXIN) tablet 0.125 mg  0.125 mg Oral Daily Lezlie Octave Black, NP   0.125 mg at 07/29/16 [id number]  . diltiazem (CARDIZEM CD) 24 hr capsule 240 mg  240 mg Oral Q1200 Lezlie Octave [place],  NP   240 mg at 07/28/16 1243  . docusate sodium (COLACE) capsule 100 mg  100 mg Oral BID Radene Gunning, NP   100 mg at 07/29/16 [id number]  . finasteride (PROSCAR) tablet 5 mg  5 mg Oral QHS Lezlie Octave Black, NP      . HYDROcodone-acetaminophen (NORCO/VICODIN) 5-325 MG per tablet 1 tablet  1 tablet Oral Q6H PRN Radene Gunning, NP   1 tablet at 07/28/16 1500  . insulin aspart (novoLOG) injection 0-9 Units  0-9 Units Subcutaneous TID WC Lezlie Octave Black, NP      . lactose free nutrition (BOOST PLUS) liquid 237 mL  237 mL Oral TID BM Nishant Dhungel, MD   237 mL at 07/28/16 2020  . latanoprost (XALATAN) 0.005 % ophthalmic solution 1 drop  1 drop Both Eyes QHS Radene Gunning, NP    1 drop at 07/28/16 2130  . magnesium oxide (MAG-OX) tablet 400 mg  400 mg Oral Q1200 Radene Gunning, NP   400 mg at 07/28/16 1243  . MEDLINE mouth rinse  15 mL Mouth Rinse BID Waldemar Dickens, MD   15 mL at 07/29/16 1000  . ondansetron (ZOFRAN) tablet 4 mg  4 mg Oral Q6H PRN Radene Gunning, NP       Or  . ondansetron [facility]) injection 4 mg  4 mg Intravenous Q6H PRN Lezlie Octave Black, NP      . phytonadione (VITAMIN K) SQ injection 5 mg  5 mg Subcutaneous Once Nishant Dhungel, MD      . QUEtiapine (SEROQUEL) tablet 25 mg  25 mg Oral QHS Nishant Dhungel, MD      . sodium chloride flush (NS) 0.9 % injection 3 mL  3 mL Intravenous Q12H Radene Gunning, NP   3 mL at 07/29/16 0925  . tamsulosin (FLOMAX) capsule 0.4 mg  0.4 mg Oral Daily Lezlie Octave Black, NP   0.4 mg at 07/29/16 [id number]  . zolpidem (AMBIEN) tablet 5 mg  5 mg Oral QHS PRN Louellen Molder, MD   5 mg at 07/28/16 2131     Discharge Medications: Please see discharge summary for a list of discharge medications.  Relevant Imaging Results:  Relevant Lab Results:   Additional Information SS#: [id number]  Candie Chroman, LCSW

## 2016-07-29 NOTE — Clinical Social Work Note (Signed)
Clinical Social Work Assessment  Patient Details  Name: Isaac Villa MRN: [id number] Date of Birth: 12-06-1924  Date of referral:  07/29/16               Reason for consult:  Facility Placement, Discharge Planning                Permission sought to share information with:  Facility Sport and exercise psychologist, Family Supports Permission granted to share information::  Yes, Verbal Permission Granted  Name::     Isaac Villa  Agency::  SNF's  Relationship::  Spouse  Contact Information:  [phone]  Housing/Transportation Living arrangements for the past 2 months:  [place] of Information:  Patient, Medical Team, Adult Children, Spouse Patient Interpreter Needed:  None Criminal Activity/Legal Involvement Pertinent to Current Situation/Hospitalization:  No - Comment as needed Significant Relationships:  Adult Children, Spouse Lives with:  Spouse Do you feel safe going back to the place where you live?  Yes Need for family participation in patient care:  Yes (Comment)  Care giving concerns:  PT recommending SNF once medically stable for discharge.   Social Worker assessment / plan:  CSW met with patient. Wife and daughter at bedside. CSW introduced role and explained that PT recommendations. Patient and his family are agreeable to SNF. First preference is [facility] and second is [facility]. All questions answered. No further concerns. CSW encouraged patient and his family to contact CSW as needed. CSW will continue to follow patient and his family for support and facilitate discharge to SNF once medically stable.  Employment status:  Retired Forensic scientist:  Medicare PT Recommendations:  [place] / Referral to community resources:  [place]  Patient/Family's Response to care:  Patent and his family agreeable to SNF placement. Patient's family supportive and involved in patient's care. Patient and his family  appreciated social work intervention.  Patient/Family's Understanding of and Emotional Response to Diagnosis, Current Treatment, and Prognosis:  Patient and his family have a good understanding of the reason for this admission. Patient and his family appear happy with hospital care.  Emotional Assessment Appearance:  Appears stated age Attitude/Demeanor/Rapport:  Other (Pleasant) Affect (typically observed):  Accepting, Appropriate, Calm, Pleasant Orientation:  Oriented to Self, Oriented to Place, Oriented to  Time, Oriented to Situation Alcohol / Substance use:  Never Used Psych involvement (Current and /or in the community):  No (Comment)  Discharge Needs  Concerns to be addressed:  Care Coordination Readmission within the last 30 days:  No Current discharge risk:  Dependent with Mobility Barriers to Discharge:  Continued Medical Work up   Candie Chroman, LCSW 07/29/2016, 3:11 PM

## 2016-07-29 NOTE — Progress Notes (Signed)
PROGRESS NOTE                                                                                                                                                                                                             Patient Demographics:    Isaac Villa, is a 83 y.o. male, DOB - [birth year], BK:[id number]  Admit date - 07/27/2016   Admitting Physician Waldemar Dickens, MD  Outpatient Primary MD for the patient is [name], MD  LOS - 2  Outpatient Specialists:Dr Bowdle  Chief Complaint  Patient presents with  . Fall       Brief Narrative   83 y.o. male with medical history significant for hypertension, BPH, A. fib on Coumadin, presents to the emergency department with the chief complaint of worsening generalized weakness shortness of breath dizziness with position change. Pt slipped on ice about 3 days back and landed on his right hip. Initial evaluation reveals hg 6.4, potassium 5.9 worsening ecchymosis right flank. CT showed large hematoma over rt thigh. Ordered for 2 u PRBC, FFP and admitted to medical floor.   Subjective:   Patient was disoriented overnight. Complains of off-and-on pain in his right hip.   Assessment  & Plan :    Principal Problem:   Symptomatic anemia secondary to large Right hip/thigh Hematoma from fall with therapeutic INR on anticoagulation.  Received 3 unit PRBC so far and FFP with mild improvement in hb.  Monitor h&H closely. Opinion 2 hold Coumadin. Ordered a dose of vitamin K.     Active Problems:  Right thigh hematoma Secondary to mechanical fall. Wife denies frequent falls at home. Hold coumadin. Pain control and PT.  Acute rhabdomyolysis Possibly secondary to fall. CPK and 700s. Also contributing to pain in his thigh. Hold statin.     Essential hypertension, benign Resume home meds. Stable. Holding lisinopril for hyperkalemia.     Atrial fibrillation ([place])  [I48.91] On chronic coumadin which is held and INR reversed for hematoma. Monitor for further bleeding. continue digoxin and cardizem.   Hyperkalemia Possibly due to rhabdomyolysis. Improved.   AKI Mild on admission, secondary to dehydration and rhabdomyolysis.Marland Kitchen ACEi held. Now improved.  generalized Weakness  seen by PT and recommend.SNF.    Anxiety and delirium. Symptoms of sundowning. Continue when necessary Xanax. Order Seroquel at bedtime.   Code Status :  full code  Family Communication  : wife  at bedside  Disposition Plan  : SNF  Barriers For Discharge : Improving symptoms. Possibly on 1/29  Consults  : none  Procedures  : CT pelvis  DVT Prophylaxis  :  SCD  Lab Results  Component Value Date   PLT 145 (L) 07/29/2016    Antibiotics  :   Anti-infectives    None        Objective:   Vitals:   07/28/16 2138 07/29/16 0556 07/29/16 0558 07/29/16 0914  BP: (!) 116/44 (!) 107/48 (!) 116/50 (!) 123/57  Pulse: 75 75 77 70  Resp: 18 18    Temp: 98.6 F (37 C) 98.3 F (36.8 C)    TempSrc: Oral Oral    SpO2: 96% 95% 97%   Weight:      Height:        Wt Readings from Last 3 Encounters:  07/27/16 78 kg (172 lb)  06/14/16 77.1 kg (170 lb)  08/31/15 77.5 kg (170 lb 12.8 oz)     Intake/Output Summary (Last 24 hours) at 07/29/16 1151 Last data filed at 07/29/16 0736  Gross per 24 hour  Intake              365 ml  Output              670 ml  Net             -305 ml     Physical Exam  Gen: not in distress HEENT:moist mucosa, supple neck Chest: clear b/l, no added sounds CVS: s1&s2 regular , no murmurs GI: soft, NT, ND,  Musculoskeletal: large hematoma over Right hip and thigh CNS: Alert and oriented    Data Review:    CBC  Recent Labs Lab 07/27/16 1541 07/28/16 0806 07/29/16 0604  WBC 14.9* 11.8* 11.2*  HGB 6.4* 7.3* 7.6*  HCT 19.9* 22.4* 23.4*  PLT 181 131* 145*  MCV 92.6 91.4 91.1  MCH 29.8 29.8 29.6  MCHC 32.2 32.6 32.5  RDW  14.8 14.8 15.7*  LYMPHSABS 1.0 1.2  --   MONOABS 1.2* 1.2*  --   EOSABS 0.0 0.0  --   BASOSABS 0.0 0.0  --     Chemistries   Recent Labs Lab 07/27/16 1541 07/28/16 0806  NA 135 138  K 5.9* 4.4  CL 103 107  CO2 25 25  GLUCOSE 141* 109*  BUN 52* 41*  CREATININE 1.41* 1.21  CALCIUM 8.4* 8.3*  AST 37  --   ALT 11*  --   ALKPHOS 54  --   BILITOT 0.9  --    ------------------------------------------------------------------------------------------------------------------ No results for input(s): CHOL, HDL, LDLCALC, TRIG, CHOLHDL, LDLDIRECT in the last 72 hours.  No results found for: HGBA1C ------------------------------------------------------------------------------------------------------------------ No results for input(s): TSH, T4TOTAL, T3FREE, THYROIDAB in the last 72 hours.  Invalid input(s): FREET3 ------------------------------------------------------------------------------------------------------------------ No results for input(s): VITAMINB12, FOLATE, FERRITIN, TIBC, IRON, RETICCTPCT in the last 72 hours.  Coagulation profile  Recent Labs Lab 07/27/16 1541 07/28/16 0806 07/29/16 0823  INR 2.39 1.34 1.22    No results for input(s): DDIMER in the last 72 hours.  Cardiac Enzymes No results for input(s): CKMB, TROPONINI, MYOGLOBIN in the last 168 hours.  Invalid input(s): CK ------------------------------------------------------------------------------------------------------------------ No results found for: BNP  Inpatient Medications  Scheduled Meds: . sodium chloride  10 mL/hr Intravenous Once  . sodium chloride   Intravenous Once  . allopurinol  300 mg Oral Q1200  . digoxin  0.125 mg Oral Daily  . diltiazem  240 mg Oral Q1200  . docusate sodium  100 mg Oral BID  . finasteride  5 mg Oral QHS  . insulin aspart  0-9 Units Subcutaneous TID WC  . lactose free nutrition  237 mL Oral TID BM  . latanoprost  1 drop Both Eyes QHS  . magnesium oxide  400  mg Oral Q1200  . mouth rinse  15 mL Mouth Rinse BID  . phytonadione  5 mg Subcutaneous Once  . sodium chloride flush  3 mL Intravenous Q12H  . tamsulosin  0.4 mg Oral Daily   Continuous Infusions: PRN Meds:.acetaminophen **OR** acetaminophen, ALPRAZolam, HYDROcodone-acetaminophen, ondansetron **OR** ondansetron (ZOFRAN) IV, zolpidem  Micro Results No results found for this or any previous visit (from the past 240 hour(s)).  Radiology Reports Ct Head Wo Contrast  Result Date: 07/24/2016 CLINICAL DATA:  Recent slip and fall with head injury and blood thinner usage, initial encounter EXAM: CT HEAD WITHOUT CONTRAST TECHNIQUE: Contiguous axial images were obtained from the base of the skull through the vertex without intravenous contrast. COMPARISON:  None. FINDINGS: Brain: Diffuse atrophic changes and chronic white matter ischemic changes noted. No findings to suggest acute hemorrhage, acute infarction or space-occupying mass lesion are noted. Vascular: No hyperdense vessel or unexpected calcification. Skull: Normal. Negative for fracture or focal lesion. Sinuses/Orbits: No acute finding. Other: None. IMPRESSION: Atrophic changes and chronic white matter ischemic change. No acute abnormality is noted. Electronically Signed   By: Inez Catalina M.D.   On: 07/24/2016 16:27   Ct Pelvis Wo Contrast  Result Date: 07/27/2016 CLINICAL DATA:  Right thigh and buttock hematoma following a fall on ice 3 days ago. EXAM: CT PELVIS WITHOUT CONTRAST TECHNIQUE: Multidetector CT imaging of the pelvis was performed following the standard protocol without intravenous contrast. COMPARISON:  07/24/2016. FINDINGS: Urinary Tract: Stable partially included right renal cyst and left renal complex cyst or solid mass. Enlarged prostate gland protruding into the base of the urinary bladder with associated mild diffuse bladder wall thickening. Bowel: Extensive sigmoid colon diverticulosis. No dilated bowel loops.  Vascular/Lymphatic: Atheromatous arterial calcifications. Reproductive: Stable moderately to markedly enlarged prostate gland with mild heterogeneity. Other: Proximal left inguinal hernia containing fat and a portion of the sigmoid colon without obstruction. Progressive diffuse subcutaneous edema on the right. The previously demonstrated 10.2 x 6.0 cm right gluteus muscle hematoma currently measures 9.9 x 6.5 cm. Musculoskeletal: Extensive lower lumbar spine facet degenerative changes and right L4 pars defect with associated grade 1 anterolisthesis at the L4-5 level. Mild right hip degenerative changes. Minimal left hip degenerative changes. No fracture or dislocation. IMPRESSION: 1. No fracture or dislocation. 2. Stable right gluteus muscle hematoma with increased associated subcutaneous edema. 3. Proximal left inguinal hernia containing fat and a small portion of the sigmoid colon without obstruction. 4. Stable moderately to markedly enlarged prostate gland with mild heterogeneity. 5. Mild diffuse bladder wall thickening compatible with chronic bladder outlet obstruction by the enlarged prostate gland. 6. Stable partially included left renal complex cyst or solid mass. This could be further evaluated with a pre and postcontrast CT or MRI if clinically indicated. 7. Lower lumbar spine degenerative changes and right L4 pars defect with associated grade 1 anterolisthesis at the L4-5 level. Electronically Signed   By: Claudie Revering M.D.   On: 07/27/2016 18:24   Ct Pelvis Wo Contrast  Result Date: 07/24/2016 CLINICAL DATA:  Slipped on ice at [employer], with acute onset of right hip pain.  Initial encounter. EXAM: CT PELVIS WITHOUT CONTRAST TECHNIQUE: Multidetector CT imaging of the pelvis was performed following the standard protocol without intravenous contrast. COMPARISON:  None. FINDINGS: Urinary Tract: The bladder is mildly distended and grossly unremarkable in appearance, with impression on the base of bladder  from the patient's significantly enlarged prostate. Bilateral renal cysts are seen, including a somewhat complex 4.9 cm left renal cyst with a slightly thickened wall and peripheral calcification. As this has very gradually grown from 1.8 cm in 2009, renal ultrasound or CT with contrast would be helpful for further evaluation, as deemed clinically appropriate. Bowel: Scattered diverticulosis is noted along the visualized portions of the colon, without evidence of diverticulitis. Vascular/Lymphatic: Scattered calcification is seen along the distal abdominal aorta and its branches. No inguinal or pelvic sidewall lymphadenopathy is seen. Enlarged retroperitoneal nodes measure up to 1.3 cm in short axis, raising question for underlying malignancy. Reproductive: The prostate is significantly enlarged, measuring 6.0 cm in transverse dimension, with minimal calcification. Other: There is a large soft tissue hematoma within the right gluteus musculature, with overlying bruising. The hematoma measures nearly 14 x 10 x 6 cm in size. Musculoskeletal: There is no definite evidence of fracture. Mild degenerative change is noted at the lower lumbar spine, with underlying chronic right-sided pars defect at L4, and mild grade 1 anterolisthesis of L4 on L5. The remainder of the visualized musculature is grossly unremarkable in appearance. IMPRESSION: 1. No definite evidence of fracture at this time. 2. Large soft tissue hematoma at the right gluteus musculature, with overlying soft tissue bruising. The hematoma measures nearly 14 x 10 x 6 cm in size. 3. Enlarged retroperitoneal nodes, measuring up to 1.3 cm in short axis, raising question for underlying malignancy. Further evaluation could be considered as deemed clinically appropriate. 4. Significantly enlarged prostate.  Would correlate with PSA. 5. Somewhat complex 4.9 cm left renal cystic lesion demonstrates slightly thickened wall and peripheral calcification, and has very  gradually grown from 1.8 cm in 2009. Renal ultrasound or CT with contrast would be helpful for further evaluation, if deemed clinically appropriate. 6. Chronic right-sided pars defect at L4, with mild grade 1 anterolisthesis of L4 on L5. Electronically Signed   By: Garald Balding M.D.   On: 07/24/2016 16:40   Dg Hip Unilat  With Pelvis 2-3 Views Right  Result Date: 07/24/2016 CLINICAL DATA:  Right hip pain after a fall today. Initial encounter. EXAM: DG HIP (WITH OR WITHOUT PELVIS) 2-3V RIGHT COMPARISON:  None. FINDINGS: Osteopenia. No fracture. Minimal collar osteophytosis along the right femoral head and minimal subchondral sclerosis in the right acetabulum. IMPRESSION: 1. No fracture or dislocation. 2. Minimal degenerative change in the right hip. Electronically Signed   By: Lorin Picket M.D.   On: 07/24/2016 13:51    Time Spent in minutes  25   Louellen Molder M.D on 07/29/2016 at 11:51 AM  Between 7am to 7pm - Pager - [phone]  After 7pm go to [email or web address] - password [facility]  [facility] -  Office  [phone]

## 2016-07-30 ENCOUNTER — Inpatient Hospital Stay (HOSPITAL_COMMUNITY): Payer: Medicare Other

## 2016-07-30 DIAGNOSIS — S7011XS Contusion of right thigh, sequela: Secondary | ICD-10-CM

## 2016-07-30 LAB — BASIC METABOLIC PANEL
Anion gap: 8 (ref 5–15)
BUN: 32 mg/dL — AB (ref 6–20)
CHLORIDE: 103 mmol/L (ref 101–111)
CO2: 25 mmol/L (ref 22–32)
CREATININE: 1.1 mg/dL (ref 0.61–1.24)
Calcium: 8 mg/dL — ABNORMAL LOW (ref 8.9–10.3)
GFR, EST NON AFRICAN AMERICAN: 57 mL/min — AB (ref >60)
Glucose, Bld: 125 mg/dL — ABNORMAL HIGH (ref 65–99)
Potassium: 4 mmol/L (ref 3.5–5.1)
Sodium: 136 mmol/L (ref 135–145)

## 2016-07-30 LAB — CBC
HCT: 23.3 % — ABNORMAL LOW (ref 39.0–52.0)
HEMOGLOBIN: 7.6 g/dL — AB (ref 13.0–17.0)
MCH: 30.3 pg (ref 26.0–34.0)
MCHC: 32.6 g/dL (ref 30.0–36.0)
MCV: 92.8 fL (ref 78.0–100.0)
PLATELETS: 152 10*3/uL (ref 150–400)
RBC: 2.51 MIL/uL — AB (ref 4.22–5.81)
RDW: 16.2 % — ABNORMAL HIGH (ref 11.5–15.5)
WBC: 10.5 10*3/uL (ref 4.0–10.5)

## 2016-07-30 LAB — HEMOGLOBIN A1C
HEMOGLOBIN A1C: 6 % — AB (ref 4.8–5.6)
MEAN PLASMA GLUCOSE: 126 mg/dL

## 2016-07-30 LAB — GLUCOSE, CAPILLARY
GLUCOSE-CAPILLARY: 132 mg/dL — AB (ref 65–99)
GLUCOSE-CAPILLARY: 101 mg/dL — AB (ref 65–99)
Glucose-Capillary: 124 mg/dL — ABNORMAL HIGH (ref 65–99)

## 2016-07-30 MED ORDER — QUETIAPINE FUMARATE 25 MG PO TABS
50.0000 mg | ORAL_TABLET | Freq: Two times a day (BID) | ORAL | Status: DC
  Administered 2016-07-30: 50 mg via ORAL
  Filled 2016-07-30: qty 2

## 2016-07-30 MED ORDER — FUROSEMIDE 10 MG/ML IJ SOLN
40.0000 mg | Freq: Once | INTRAMUSCULAR | Status: AC
  Administered 2016-07-30: 40 mg via INTRAVENOUS
  Filled 2016-07-30: qty 4

## 2016-07-30 MED ORDER — HALOPERIDOL LACTATE 5 MG/ML IJ SOLN: 2.0000 mg | Freq: Four times a day (QID) | INTRAMUSCULAR | Status: DC | PRN

## 2016-07-30 MED ORDER — ACETAMINOPHEN 325 MG PO TABS: 650.0000 mg | ORAL_TABLET | Freq: Four times a day (QID) | ORAL | Status: DC | PRN

## 2016-07-30 NOTE — Progress Notes (Signed)
Per patient family request they want all side rails to be up and ambiem at bed time, pt is having on and off confusion I will continue to monitor pt

## 2016-07-30 NOTE — Progress Notes (Signed)
Pt. Has been sleepy all day, have not eaten or drank anything other than with his medication.  Call rapid response nurse to come and check him.  Respiration 32 and temp. 100.2 rectal. Dr. Clementeen Graham text paged and informed of above.  Returned called and orders received for portable chest x-ray.  Will continue to monitor and complete new orders.  Alphonzo Lemmings, RN

## 2016-07-30 NOTE — Progress Notes (Signed)
Text paged MD again to informed portable chest x-ray report was in the computer, pt. Resp. Was still between 28-32, O2 sats 94-96% on R/A.  Return call from Dr. Clementeen Graham, informed him that x-ray report showed cardiomegaly, mild pulmonary vascular congestion and bibasilar Atelectasis.  Orders given to give 40 mg IV lasix, also informed Dr. Clementeen Graham that bladder scan showed 330 ml.  Will continue to monitor and carry out orders.  Alphonzo Lemmings, RN

## 2016-07-30 NOTE — Progress Notes (Signed)
PROGRESS NOTE                                                                                                                                                                                                             Patient Demographics:    Isaac Villa, is a 83 y.o. male, DOB - 04-Feb-1925, [id number]  Admit date - 07/27/2016   Admitting Physician Waldemar Dickens, MD  Outpatient Primary MD for the patient is [name], MD  LOS - 3  Outpatient Specialists:Dr [place]  Chief Complaint  Patient presents with  . Fall       Brief Narrative   83 y.o. male with medical history significant for hypertension, BPH, A. fib on Coumadin, presents to the emergency department with the chief complaint of worsening generalized weakness shortness of breath dizziness with position change. Pt slipped on ice about 3 days back and landed on his right hip. Initial evaluation reveals hg 6.4, potassium 5.9 worsening ecchymosis right flank. CT showed large hematoma over rt thigh. Ordered for 2 u PRBC, FFP and admitted to medical floor.   Subjective:   Complained of hip pain. Was quite confused again last evening.   Assessment  & Plan :    Principal Problem:   Symptomatic anemia secondary to large Right hip/thigh Hematoma from fall with therapeutic INR on anticoagulation.  Received 3 unit PRBC so far and FFP with mild improvement in hb and remains stable. Continue to hold Coumadin. Her H&H closely.   Active Problems:  Right thigh hematoma Secondary to mechanical fall. Wife denies frequent falls at home. Continue to hold Coumadin. Half hematoma spreading into the right back and is quite warm. Will order ice packing..  Acute rhabdomyolysis Possibly secondary to fall. CPK and 700s. Also contributing to pain in his thigh. Hold statin.     Essential hypertension, benign Resume home meds. Stable. Holding lisinopril for  hyperkalemia.     Atrial fibrillation (HCC) On chronic coumadin which is held and INR reversed for hematoma. Monitor for further bleeding. continue digoxin and cardizem.   Hyperkalemia Possibly due to rhabdomyolysis. Improved.   AKI Mild on admission, secondary to dehydration and rhabdomyolysis.Marland Kitchen ACEi held. Now improved.  generalized Weakness  seen by PT and recommend.SNF.    Anxiety and delirium. Symptoms of sundowning. Continue when necessary Xanax. Switch to twice a day  Seroquel.   Code Status : full code  Family Communication  : Son  at bedside  Disposition Plan  : SNF  Barriers For Discharge : Improving symptoms. Possibly on 1/29  Consults  : none  Procedures  : CT pelvis  DVT Prophylaxis  :  SCD  Lab Results  Component Value Date   PLT 152 07/30/2016    Antibiotics  :   Anti-infectives    None        Objective:   Vitals:   07/29/16 1424 07/29/16 2136 07/30/16 0628 07/30/16 1015  BP: (!) 125/46 (!) 122/46 (!) 135/53   Pulse: 68 77 74 73  Resp: 16 18 18    Temp: 98.2 F (36.8 C) 99.7 F (37.6 C) 97.5 F (36.4 C)   TempSrc: Oral Oral Oral   SpO2: 99% 96% 92%   Weight:      Height:        Wt Readings from Last 3 Encounters:  07/27/16 78 kg (172 lb)  06/14/16 77.1 kg (170 lb)  08/31/15 77.5 kg (170 lb 12.8 oz)     Intake/Output Summary (Last 24 hours) at 07/30/16 1122 Last data filed at 07/30/16 0717  Gross per 24 hour  Intake              220 ml  Output              375 ml  Net             -155 ml     Physical Exam  Gen: Somnolent after receiving Seroquel this morning. HEENT:moist mucosa, supple neck Chest: clear b/l, no added sounds CVS: s1&s2 regular , no murmurs GI: soft, NT, ND,  Musculoskeletal: large hematoma over Right hip and thigh, Now involving right lower back     Data Review:    CBC  Recent Labs Lab 07/27/16 1541 07/28/16 0806 07/29/16 0604 07/30/16 0415  WBC 14.9* 11.8* 11.2* 10.5  HGB 6.4* 7.3*  7.6* 7.6*  HCT 19.9* 22.4* 23.4* 23.3*  PLT 181 131* 145* 152  MCV 92.6 91.4 91.1 92.8  MCH 29.8 29.8 29.6 30.3  MCHC 32.2 32.6 32.5 32.6  RDW 14.8 14.8 15.7* 16.2*  LYMPHSABS 1.0 1.2  --   --   MONOABS 1.2* 1.2*  --   --   EOSABS 0.0 0.0  --   --   BASOSABS 0.0 0.0  --   --     Chemistries   Recent Labs Lab 07/27/16 1541 07/28/16 0806 07/30/16 0415  NA 135 138 136  K 5.9* 4.4 4.0  CL 103 107 103  CO2 25 25 25   GLUCOSE 141* 109* 125*  BUN 52* 41* 32*  CREATININE 1.41* 1.21 1.10  CALCIUM 8.4* 8.3* 8.0*  AST 37  --   --   ALT 11*  --   --   ALKPHOS 54  --   --   BILITOT 0.9  --   --    ------------------------------------------------------------------------------------------------------------------ No results for input(s): CHOL, HDL, LDLCALC, TRIG, CHOLHDL, LDLDIRECT in the last 72 hours.  No results found for: HGBA1C ------------------------------------------------------------------------------------------------------------------ No results for input(s): TSH, T4TOTAL, T3FREE, THYROIDAB in the last 72 hours.  Invalid input(s): FREET3 ------------------------------------------------------------------------------------------------------------------ No results for input(s): VITAMINB12, FOLATE, FERRITIN, TIBC, IRON, RETICCTPCT in the last 72 hours.  Coagulation profile  Recent Labs Lab 07/27/16 1541 07/28/16 0806 07/29/16 0823  INR 2.39 1.34 1.22    No results for input(s): DDIMER in the last 72 hours.  Cardiac  Enzymes No results for input(s): CKMB, TROPONINI, MYOGLOBIN in the last 168 hours.  Invalid input(s): CK ------------------------------------------------------------------------------------------------------------------ No results found for: BNP  Inpatient Medications  Scheduled Meds: . sodium chloride  10 mL/hr Intravenous Once  . sodium chloride   Intravenous Once  . allopurinol  300 mg Oral Q1200  . digoxin  0.125 mg Oral Daily  . diltiazem   240 mg Oral Q1200  . docusate sodium  100 mg Oral BID  . finasteride  5 mg Oral QHS  . insulin aspart  0-9 Units Subcutaneous TID WC  . lactose free nutrition  237 mL Oral TID BM  . latanoprost  1 drop Both Eyes QHS  . magnesium oxide  400 mg Oral Q1200  . mouth rinse  15 mL Mouth Rinse BID  . QUEtiapine  50 mg Oral BID  . sodium chloride flush  3 mL Intravenous Q12H  . tamsulosin  0.4 mg Oral Daily   Continuous Infusions: PRN Meds:.acetaminophen **OR** acetaminophen, ALPRAZolam, HYDROcodone-acetaminophen, ondansetron **OR** ondansetron (ZOFRAN) IV, zolpidem  Micro Results No results found for this or any previous visit (from the past 240 hour(s)).  Radiology Reports Ct Head Wo Contrast  Result Date: 07/24/2016 CLINICAL DATA:  Recent slip and fall with head injury and blood thinner usage, initial encounter EXAM: CT HEAD WITHOUT CONTRAST TECHNIQUE: Contiguous axial images were obtained from the base of the skull through the vertex without intravenous contrast. COMPARISON:  None. FINDINGS: Brain: Diffuse atrophic changes and chronic white matter ischemic changes noted. No findings to suggest acute hemorrhage, acute infarction or space-occupying mass lesion are noted. Vascular: No hyperdense vessel or unexpected calcification. Skull: Normal. Negative for fracture or focal lesion. Sinuses/Orbits: No acute finding. Other: None. IMPRESSION: Atrophic changes and chronic white matter ischemic change. No acute abnormality is noted. Electronically Signed   By: Inez Catalina M.D.   On: 07/24/2016 16:27   Ct Pelvis Wo Contrast  Result Date: 07/27/2016 CLINICAL DATA:  Right thigh and buttock hematoma following a fall on ice 3 days ago. EXAM: CT PELVIS WITHOUT CONTRAST TECHNIQUE: Multidetector CT imaging of the pelvis was performed following the standard protocol without intravenous contrast. COMPARISON:  07/24/2016. FINDINGS: Urinary Tract: Stable partially included right renal cyst and left renal complex  cyst or solid mass. Enlarged prostate gland protruding into the base of the urinary bladder with associated mild diffuse bladder wall thickening. Bowel: Extensive sigmoid colon diverticulosis. No dilated bowel loops. Vascular/Lymphatic: Atheromatous arterial calcifications. Reproductive: Stable moderately to markedly enlarged prostate gland with mild heterogeneity. Other: Proximal left inguinal hernia containing fat and a portion of the sigmoid colon without obstruction. Progressive diffuse subcutaneous edema on the right. The previously demonstrated 10.2 x 6.0 cm right gluteus muscle hematoma currently measures 9.9 x 6.5 cm. Musculoskeletal: Extensive lower lumbar spine facet degenerative changes and right L4 pars defect with associated grade 1 anterolisthesis at the L4-5 level. Mild right hip degenerative changes. Minimal left hip degenerative changes. No fracture or dislocation. IMPRESSION: 1. No fracture or dislocation. 2. Stable right gluteus muscle hematoma with increased associated subcutaneous edema. 3. Proximal left inguinal hernia containing fat and a small portion of the sigmoid colon without obstruction. 4. Stable moderately to markedly enlarged prostate gland with mild heterogeneity. 5. Mild diffuse bladder wall thickening compatible with chronic bladder outlet obstruction by the enlarged prostate gland. 6. Stable partially included left renal complex cyst or solid mass. This could be further evaluated with a pre and postcontrast CT or MRI if clinically indicated. 7. Lower  lumbar spine degenerative changes and right L4 pars defect with associated grade 1 anterolisthesis at the L4-5 level. Electronically Signed   By: Claudie Revering M.D.   On: 07/27/2016 18:24   Ct Pelvis Wo Contrast  Result Date: 07/24/2016 CLINICAL DATA:  Slipped on ice at [employer], with acute onset of right hip pain. Initial encounter. EXAM: CT PELVIS WITHOUT CONTRAST TECHNIQUE: Multidetector CT imaging of the pelvis was performed  following the standard protocol without intravenous contrast. COMPARISON:  None. FINDINGS: Urinary Tract: The bladder is mildly distended and grossly unremarkable in appearance, with impression on the base of bladder from the patient's significantly enlarged prostate. Bilateral renal cysts are seen, including a somewhat complex 4.9 cm left renal cyst with a slightly thickened wall and peripheral calcification. As this has very gradually grown from 1.8 cm in 2009, renal ultrasound or CT with contrast would be helpful for further evaluation, as deemed clinically appropriate. Bowel: Scattered diverticulosis is noted along the visualized portions of the colon, without evidence of diverticulitis. Vascular/Lymphatic: Scattered calcification is seen along the distal abdominal aorta and its branches. No inguinal or pelvic sidewall lymphadenopathy is seen. Enlarged retroperitoneal nodes measure up to 1.3 cm in short axis, raising question for underlying malignancy. Reproductive: The prostate is significantly enlarged, measuring 6.0 cm in transverse dimension, with minimal calcification. Other: There is a large soft tissue hematoma within the right gluteus musculature, with overlying bruising. The hematoma measures nearly 14 x 10 x 6 cm in size. Musculoskeletal: There is no definite evidence of fracture. Mild degenerative change is noted at the lower lumbar spine, with underlying chronic right-sided pars defect at L4, and mild grade 1 anterolisthesis of L4 on L5. The remainder of the visualized musculature is grossly unremarkable in appearance. IMPRESSION: 1. No definite evidence of fracture at this time. 2. Large soft tissue hematoma at the right gluteus musculature, with overlying soft tissue bruising. The hematoma measures nearly 14 x 10 x 6 cm in size. 3. Enlarged retroperitoneal nodes, measuring up to 1.3 cm in short axis, raising question for underlying malignancy. Further evaluation could be considered as deemed  clinically appropriate. 4. Significantly enlarged prostate.  Would correlate with PSA. 5. Somewhat complex 4.9 cm left renal cystic lesion demonstrates slightly thickened wall and peripheral calcification, and has very gradually grown from 1.8 cm in 2009. Renal ultrasound or CT with contrast would be helpful for further evaluation, if deemed clinically appropriate. 6. Chronic right-sided pars defect at L4, with mild grade 1 anterolisthesis of L4 on L5. Electronically Signed   By: Garald Balding M.D.   On: 07/24/2016 16:40   Dg Hip Unilat  With Pelvis 2-3 Views Right  Result Date: 07/24/2016 CLINICAL DATA:  Right hip pain after a fall today. Initial encounter. EXAM: DG HIP (WITH OR WITHOUT PELVIS) 2-3V RIGHT COMPARISON:  None. FINDINGS: Osteopenia. No fracture. Minimal collar osteophytosis along the right femoral head and minimal subchondral sclerosis in the right acetabulum. IMPRESSION: 1. No fracture or dislocation. 2. Minimal degenerative change in the right hip. Electronically Signed   By: Lorin Picket M.D.   On: 07/24/2016 13:51    Time Spent in minutes  25   Louellen Molder M.D on 07/30/2016 at 11:22 AM  Between 7am to 7pm - Pager - [phone]  After 7pm go to [email or web address] - password [facility]  [facility] -  Office  [phone]

## 2016-07-31 ENCOUNTER — Inpatient Hospital Stay (HOSPITAL_COMMUNITY): Payer: Medicare Other

## 2016-07-31 DIAGNOSIS — M109 Gout, unspecified: Secondary | ICD-10-CM

## 2016-07-31 LAB — CBC
HCT: 23.5 % — ABNORMAL LOW (ref 39.0–52.0)
HEMOGLOBIN: 7.5 g/dL — AB (ref 13.0–17.0)
MCH: 29.9 pg (ref 26.0–34.0)
MCHC: 31.9 g/dL (ref 30.0–36.0)
MCV: 93.6 fL (ref 78.0–100.0)
Platelets: 168 10*3/uL (ref 150–400)
RBC: 2.51 MIL/uL — AB (ref 4.22–5.81)
RDW: 16.1 % — ABNORMAL HIGH (ref 11.5–15.5)
WBC: 11.2 10*3/uL — ABNORMAL HIGH (ref 4.0–10.5)

## 2016-07-31 LAB — GLUCOSE, CAPILLARY
GLUCOSE-CAPILLARY: 145 mg/dL — AB (ref 65–99)
Glucose-Capillary: 124 mg/dL — ABNORMAL HIGH (ref 65–99)
Glucose-Capillary: 104 mg/dL — ABNORMAL HIGH (ref 65–99)
Glucose-Capillary: 150 mg/dL — ABNORMAL HIGH (ref 65–99)
Glucose-Capillary: 137 mg/dL — ABNORMAL HIGH (ref 65–99)

## 2016-07-31 LAB — URIC ACID: Uric Acid, Serum: 6.7 mg/dL (ref 4.4–7.6)

## 2016-07-31 MED ORDER — PREDNISONE 1 MG PO TABS
40.0000 mg | ORAL_TABLET | Freq: Every day | ORAL | Status: DC
  Filled 2016-07-31: qty 40

## 2016-07-31 MED ORDER — PREDNISONE 20 MG PO TABS
40.0000 mg | ORAL_TABLET | Freq: Every day | ORAL | Status: DC
  Administered 2016-07-31 – 2016-08-01 (×2): 40 mg via ORAL
  Filled 2016-07-31 (×2): qty 2

## 2016-07-31 MED ORDER — POLYETHYLENE GLYCOL 3350 17 G PO PACK
17.0000 g | PACK | Freq: Every day | ORAL | Status: DC
  Administered 2016-07-31 – 2016-08-01 (×2): 17 g via ORAL
  Filled 2016-07-31 (×2): qty 1

## 2016-07-31 NOTE — Progress Notes (Signed)
PROGRESS NOTE                                                                                                                                                                                                             Patient Demographics:    Isaac Villa, is a 83 y.o. male, DOB - [birth year], [id number]  Admit date - 07/27/2016   Admitting Physician Waldemar Dickens, MD  Outpatient Primary MD for the patient is [name], MD  LOS - 4  Outpatient Specialists:Dr [place]  Chief Complaint  Patient presents with  . Fall       Brief Narrative   83 y.o. male with medical history significant for hypertension, BPH, A. fib on Coumadin, presents to the emergency department with the chief complaint of worsening generalized weakness shortness of breath dizziness with position change. Pt slipped on ice about 3 days back and landed on his right hip. Initial evaluation reveals hg 6.4, potassium 5.9 worsening ecchymosis right flank. CT showed large hematoma over rt thigh. Ordered for 2 u PRBC, FFP and admitted to medical floor.   Subjective:   Pain in the hip is better but now complains of pain and swelling in his right knee. Had some confusion during the night resolved with IV Haldol.   Assessment  & Plan :    Principal Problem:   Symptomatic anemia secondary to large Right hip/thigh Hematoma from fall with therapeutic INR on anticoagulation.  Received 3 unit PRBC so far and FFP . Continue to hold Coumadin. Her H&H closely.   Active Problems:  ? acute gouty arthritis Pain and swelling in the right knee. Patient reports similar symptoms with gout in the past. X-ray of the knee showing severe osteoarthritic changes. I will see him on 5 day course of oral prednisone.   Right thigh hematoma Secondary to mechanical fall. Wife denies frequent falls at home. Continue to hold Coumadin. Bruising spread up to the mid back and  most of the posterior thigh. H&H low but stable.  Acute rhabdomyolysis Possibly secondary to fall. CPK in 700s. Also contributing to pain in his thigh. Hold statin.     Essential hypertension, benign Resume home meds. Stable. Holding lisinopril for hyperkalemia.     Atrial fibrillation (HCC) On chronic coumadin which is held and INR reversed for hematoma. Monitor for further bleeding. continue  digoxin and cardizem.   Hyperkalemia Possibly due to rhabdomyolysis. Improved.  AKI Mild on admission, secondary to dehydration and rhabdomyolysis.Marland Kitchen ACEi held. Now improved.    generalized Weakness  seen by PT and recommend.SNF.    Anxiety and delirium. Symptoms of sundowning. Patient may see sleepy yesterday after receiving Xanax, pain medication and Seroquel. Haldol and a son when necessary low-dose Haldol. Discontinue telemetry.   Code Status : full code  Family Communication  : Son  at bedside  Disposition Plan  : SNF  Barriers For Discharge : Improving symptoms. Possibly to SNF tomorrow  Consults  : none  Procedures  : CT pelvis  DVT Prophylaxis  :  SCD  Lab Results  Component Value Date   PLT 168 07/31/2016    Antibiotics  :   Anti-infectives    None        Objective:   Vitals:   07/31/16 0615 07/31/16 1039 07/31/16 1236 07/31/16 1239  BP: (!) 115/52  (!) 121/48 (!) 115/52  Pulse: 81  71   Resp: 18     Temp: 98.6 F (37 C)     TempSrc: Oral     SpO2: 98% 96% 100%   Weight:      Height:        Wt Readings from Last 3 Encounters:  07/27/16 78 kg (172 lb)  06/14/16 77.1 kg (170 lb)  08/31/15 77.5 kg (170 lb 12.8 oz)     Intake/Output Summary (Last 24 hours) at 07/31/16 1308 Last data filed at 07/31/16 1126  Gross per 24 hour  Intake              340 ml  Output              800 ml  Net             -460 ml     Physical Exam  Gen: not in distress HEENT:moist mucosa, supple neck Chest: clear b/l, no added sounds CVS: s1&s2 regular , no  murmurs GI: soft, NT, ND,  Musculoskeletal: large hematoma over Right hip and thigh, Now involving right  back CNS: alert and oriented     Data Review:    CBC  Recent Labs Lab 07/27/16 1541 07/28/16 0806 07/29/16 0604 07/30/16 0415 07/31/16 0555  WBC 14.9* 11.8* 11.2* 10.5 11.2*  HGB 6.4* 7.3* 7.6* 7.6* 7.5*  HCT 19.9* 22.4* 23.4* 23.3* 23.5*  PLT 181 131* 145* 152 168  MCV 92.6 91.4 91.1 92.8 93.6  MCH 29.8 29.8 29.6 30.3 29.9  MCHC 32.2 32.6 32.5 32.6 31.9  RDW 14.8 14.8 15.7* 16.2* 16.1*  LYMPHSABS 1.0 1.2  --   --   --   MONOABS 1.2* 1.2*  --   --   --   EOSABS 0.0 0.0  --   --   --   BASOSABS 0.0 0.0  --   --   --     Chemistries   Recent Labs Lab 07/27/16 1541 07/28/16 0806 07/30/16 0415  NA 135 138 136  K 5.9* 4.4 4.0  CL 103 107 103  CO2 25 25 25   GLUCOSE 141* 109* 125*  BUN 52* 41* 32*  CREATININE 1.41* 1.21 1.10  CALCIUM 8.4* 8.3* 8.0*  AST 37  --   --   ALT 11*  --   --   ALKPHOS 54  --   --   BILITOT 0.9  --   --    ------------------------------------------------------------------------------------------------------------------ No results for input(s):  CHOL, HDL, LDLCALC, TRIG, CHOLHDL, LDLDIRECT in the last 72 hours.  Lab Results  Component Value Date   HGBA1C 6.0 (H) 07/28/2016   ------------------------------------------------------------------------------------------------------------------ No results for input(s): TSH, T4TOTAL, T3FREE, THYROIDAB in the last 72 hours.  Invalid input(s): FREET3 ------------------------------------------------------------------------------------------------------------------ No results for input(s): VITAMINB12, FOLATE, FERRITIN, TIBC, IRON, RETICCTPCT in the last 72 hours.  Coagulation profile  Recent Labs Lab 07/27/16 1541 07/28/16 0806 07/29/16 0823  INR 2.39 1.34 1.22    No results for input(s): DDIMER in the last 72 hours.  Cardiac Enzymes No results for input(s): CKMB, TROPONINI,  MYOGLOBIN in the last 168 hours.  Invalid input(s): CK ------------------------------------------------------------------------------------------------------------------ No results found for: BNP  Inpatient Medications  Scheduled Meds: . sodium chloride  10 mL/hr Intravenous Once  . sodium chloride   Intravenous Once  . allopurinol  300 mg Oral Q1200  . digoxin  0.125 mg Oral Daily  . diltiazem  240 mg Oral Q1200  . docusate sodium  100 mg Oral BID  . finasteride  5 mg Oral QHS  . insulin aspart  0-9 Units Subcutaneous TID WC  . lactose free nutrition  237 mL Oral TID BM  . latanoprost  1 drop Both Eyes QHS  . magnesium oxide  400 mg Oral Q1200  . mouth rinse  15 mL Mouth Rinse BID  . polyethylene glycol  17 g Oral Daily  . sodium chloride flush  3 mL Intravenous Q12H  . tamsulosin  0.4 mg Oral Daily   Continuous Infusions: PRN Meds:.acetaminophen, haloperidol lactate, ondansetron **OR** ondansetron (ZOFRAN) IV  Micro Results No results found for this or any previous visit (from the past 240 hour(s)).  Radiology Reports Ct Head Wo Contrast  Result Date: 07/24/2016 CLINICAL DATA:  Recent slip and fall with head injury and blood thinner usage, initial encounter EXAM: CT HEAD WITHOUT CONTRAST TECHNIQUE: Contiguous axial images were obtained from the base of the skull through the vertex without intravenous contrast. COMPARISON:  None. FINDINGS: Brain: Diffuse atrophic changes and chronic white matter ischemic changes noted. No findings to suggest acute hemorrhage, acute infarction or space-occupying mass lesion are noted. Vascular: No hyperdense vessel or unexpected calcification. Skull: Normal. Negative for fracture or focal lesion. Sinuses/Orbits: No acute finding. Other: None. IMPRESSION: Atrophic changes and chronic white matter ischemic change. No acute abnormality is noted. Electronically Signed   By: Inez Catalina M.D.   On: 07/24/2016 16:27   Ct Pelvis Wo Contrast  Result  Date: 07/27/2016 CLINICAL DATA:  Right thigh and buttock hematoma following a fall on ice 3 days ago. EXAM: CT PELVIS WITHOUT CONTRAST TECHNIQUE: Multidetector CT imaging of the pelvis was performed following the standard protocol without intravenous contrast. COMPARISON:  07/24/2016. FINDINGS: Urinary Tract: Stable partially included right renal cyst and left renal complex cyst or solid mass. Enlarged prostate gland protruding into the base of the urinary bladder with associated mild diffuse bladder wall thickening. Bowel: Extensive sigmoid colon diverticulosis. No dilated bowel loops. Vascular/Lymphatic: Atheromatous arterial calcifications. Reproductive: Stable moderately to markedly enlarged prostate gland with mild heterogeneity. Other: Proximal left inguinal hernia containing fat and a portion of the sigmoid colon without obstruction. Progressive diffuse subcutaneous edema on the right. The previously demonstrated 10.2 x 6.0 cm right gluteus muscle hematoma currently measures 9.9 x 6.5 cm. Musculoskeletal: Extensive lower lumbar spine facet degenerative changes and right L4 pars defect with associated grade 1 anterolisthesis at the L4-5 level. Mild right hip degenerative changes. Minimal left hip degenerative changes. No fracture or dislocation.  IMPRESSION: 1. No fracture or dislocation. 2. Stable right gluteus muscle hematoma with increased associated subcutaneous edema. 3. Proximal left inguinal hernia containing fat and a small portion of the sigmoid colon without obstruction. 4. Stable moderately to markedly enlarged prostate gland with mild heterogeneity. 5. Mild diffuse bladder wall thickening compatible with chronic bladder outlet obstruction by the enlarged prostate gland. 6. Stable partially included left renal complex cyst or solid mass. This could be further evaluated with a pre and postcontrast CT or MRI if clinically indicated. 7. Lower lumbar spine degenerative changes and right L4 pars defect  with associated grade 1 anterolisthesis at the L4-5 level. Electronically Signed   By: Claudie Revering M.D.   On: 07/27/2016 18:24   Ct Pelvis Wo Contrast  Result Date: 07/24/2016 CLINICAL DATA:  Slipped on ice at [employer], with acute onset of right hip pain. Initial encounter. EXAM: CT PELVIS WITHOUT CONTRAST TECHNIQUE: Multidetector CT imaging of the pelvis was performed following the standard protocol without intravenous contrast. COMPARISON:  None. FINDINGS: Urinary Tract: The bladder is mildly distended and grossly unremarkable in appearance, with impression on the base of bladder from the patient's significantly enlarged prostate. Bilateral renal cysts are seen, including a somewhat complex 4.9 cm left renal cyst with a slightly thickened wall and peripheral calcification. As this has very gradually grown from 1.8 cm in 2009, renal ultrasound or CT with contrast would be helpful for further evaluation, as deemed clinically appropriate. Bowel: Scattered diverticulosis is noted along the visualized portions of the colon, without evidence of diverticulitis. Vascular/Lymphatic: Scattered calcification is seen along the distal abdominal aorta and its branches. No inguinal or pelvic sidewall lymphadenopathy is seen. Enlarged retroperitoneal nodes measure up to 1.3 cm in short axis, raising question for underlying malignancy. Reproductive: The prostate is significantly enlarged, measuring 6.0 cm in transverse dimension, with minimal calcification. Other: There is a large soft tissue hematoma within the right gluteus musculature, with overlying bruising. The hematoma measures nearly 14 x 10 x 6 cm in size. Musculoskeletal: There is no definite evidence of fracture. Mild degenerative change is noted at the lower lumbar spine, with underlying chronic right-sided pars defect at L4, and mild grade 1 anterolisthesis of L4 on L5. The remainder of the visualized musculature is grossly unremarkable in appearance.  IMPRESSION: 1. No definite evidence of fracture at this time. 2. Large soft tissue hematoma at the right gluteus musculature, with overlying soft tissue bruising. The hematoma measures nearly 14 x 10 x 6 cm in size. 3. Enlarged retroperitoneal nodes, measuring up to 1.3 cm in short axis, raising question for underlying malignancy. Further evaluation could be considered as deemed clinically appropriate. 4. Significantly enlarged prostate.  Would correlate with PSA. 5. Somewhat complex 4.9 cm left renal cystic lesion demonstrates slightly thickened wall and peripheral calcification, and has very gradually grown from 1.8 cm in 2009. Renal ultrasound or CT with contrast would be helpful for further evaluation, if deemed clinically appropriate. 6. Chronic right-sided pars defect at L4, with mild grade 1 anterolisthesis of L4 on L5. Electronically Signed   By: Garald Balding M.D.   On: 07/24/2016 16:40   Dg Chest Port 1 View  Result Date: 07/30/2016 CLINICAL DATA:  Shortness of breath. EXAM: PORTABLE CHEST 1 VIEW COMPARISON:  11/07/2006 and prior chest radiographs FINDINGS: Cardiomegaly and mild pulmonary vascular congestion noted. Bibasilar atelectasis is identified. There is no evidence of focal airspace disease, pulmonary edema, suspicious pulmonary nodule/mass, pleural effusion, or pneumothorax. No acute bony abnormalities are  identified. IMPRESSION: Cardiomegaly, mild pulmonary vascular congestion and bibasilar atelectasis. Electronically Signed   By: Margarette Canada M.D.   On: 07/30/2016 14:55   Dg Hip Unilat  With Pelvis 2-3 Views Right  Result Date: 07/24/2016 CLINICAL DATA:  Right hip pain after a fall today. Initial encounter. EXAM: DG HIP (WITH OR WITHOUT PELVIS) 2-3V RIGHT COMPARISON:  None. FINDINGS: Osteopenia. No fracture. Minimal collar osteophytosis along the right femoral head and minimal subchondral sclerosis in the right acetabulum. IMPRESSION: 1. No fracture or dislocation. 2. Minimal degenerative  change in the right hip. Electronically Signed   By: Lorin Picket M.D.   On: 07/24/2016 13:51   Dg Knee 2 Views Right  Result Date: 07/31/2016 CLINICAL DATA:  Medial right knee pain made worse with physical activity. Medial swelling. No known injury. EXAM: RIGHT KNEE - 3 VIEW COMPARISON:  None in PACs FINDINGS: The bones are subjectively adequately mineralized. There is high-grade joint space loss of the medial compartment with a near bone on bone appearance. There is slight lateral subluxation of the tibial plateaus with respect to the femoral condyles. There is chondrocalcinosis of the menisci. There is beaking of the medial tibial spine. There are spurs arising from the articular margins of the tibial plateaus and the medial femoral condyle. There is moderate narrowing of the patellofemoral joint space. There may be an old transversely oriented fracture through the superior pole of the distal femur at the insertion of the quadriceps tendon. There is no definite acute effusion. There is some bony remodeling along the anterior superior articular surface of the distal femur. IMPRESSION: There is moderate severe osteoarthritic joint space loss with a near bone on bone appearance involving the medial compartment. Moderate degenerative patellofemoral joint space loss. Electronically Signed   By: David  Martinique M.D.   On: 07/31/2016 12:00    Time Spent in minutes  25   Louellen Molder M.D on 07/31/2016 at 1:08 PM  Between 7am to 7pm - Pager - [phone]  After 7pm go to [email or web address] - password [email or web address]  [facility] -  Office  [phone]

## 2016-07-31 NOTE — Consult Note (Signed)
[facility] CM Primary Care Navigator  07/31/2016  Isaac Villa 1924/07/15 [id number]  Met with patient and wife Isaac Villa) at the bedside to identify possible discharge needs. Patient was asleep most of the visit, awakens at times joining conversation. Patient's wife reports he had a fall last week and had been evaluated at [facility]. After few days, he became gradually weaker, could hardly get up and developed large bruising to his right hip area which had led to this admission.  Patient's wife endorses Dr. Lauretta Grill Koirala with [place] [facility] as the primary care provider.    Patient shared using [facility] to obtain medications without any problem.   Patient manages his medications at home with wife's assistance using "pill box" system prepared every 4 weeks.   Wife mentioned that patient was able to drive prior to admission but wife will provide transportation to his doctors' appointments after discharge.  Wife will be his primary caregiver at home and children (son and 2 daughters) will be able help with his care as needed. Wife states might be able to use a caregiver when patient returns back home. [facility] list of personal care services was provided and understands that they will be paying out of the pocket for it.   Discharge plan per PT recommendation is skilled nursing facility once medically stable for discharge (still in process by inpatient social worker) .  Patient's wife voiced understanding to call primary care provider's office once he returns back home, for a post discharge follow-up appointment within a week or sooner if needs arise.  Patient letter (with PCP's contact number) provided as a reminder.  Wife denies further needs or concerns at this time.  For additional questions please contact:  Edwena Felty A. Lokelani Lutes, BSN, RN-BC [facility] PRIMARY CARE Navigator Cell: [phone]

## 2016-07-31 NOTE — Progress Notes (Signed)
Physical Therapy Treatment Patient Details Name: Isaac Villa MRN: [id number] DOB: 29-Jun-1925 Today's Date: 07/31/2016    History of Present Illness Isaac Villa is a delightful 83 y.o. male with medical history significant for hypertension, BPH, A. fib on Coumadin, presents to the emergency department with the chief complaint of worsening generalized weakness shortness of breath dizziness with position change. Initial evaluation reveals hg 6.4, potassium 5.9 worsening ecchymosis right flank    PT Comments    Pt set back functionally by lethargy and thus immobility over weekend. Now with increased stiffness and soreness right knee due to OA. Required mod/ max A to get to EOB. Mod A +2 to pivot to chair. Was unable to ambulate today due to knee pain and dizziness in sitting. Pt was left in chair with chair alarm on. PT will continue to follow.   Follow Up Recommendations  SNF;Supervision/Assistance - 24 hour     Equipment Recommendations  Rolling walker with 5" wheels    Recommendations for Other Services       Precautions / Restrictions Precautions Precautions: Fall Precaution Comments: pt fell on ice on Monday and then fell getting out of bed a couple of days later Restrictions Weight Bearing Restrictions: No Other Position/Activity Restrictions: wife bringing knee brace from home to help with R knee pain    Mobility  Bed Mobility Overal bed mobility: Needs Assistance Bed Mobility: Rolling;Sidelying to Sit Rolling: Mod assist Sidelying to sit: Max assist       General bed mobility comments: pt limited by R knee pain, mod A to roll to left with max A for legs off bed and hips to EOB as well as elevation of trunk  Transfers Overall transfer level: Needs assistance Equipment used: 2 person hand held assist Transfers: Sit to/from [facility] Sit to Stand: Max assist Stand pivot transfers: Mod assist;+2 physical assistance       General transfer  comment: pt stood partially from EOB with max A but was unable to acheive full standing due to knee pain. Then bilateral HHA, mod A used to stand and pivot to chair to left, away from hurting knee  Ambulation/Gait             General Gait Details: pt unable to walk today due to knee pain as well as dizziness sitting EOB   Stairs            Wheelchair Mobility    Modified Rankin (Stroke Patients Only)       Balance Overall balance assessment: Needs assistance;History of Falls Sitting-balance support: Single extremity supported Sitting balance-Leahy Scale: Fair Sitting balance - Comments: pt still with left lean but able to shift wt to right for short periods and able to sit without left elbow down on bed Postural control: Left lateral lean Standing balance support: Bilateral upper extremity supported Standing balance-Leahy Scale: Poor Standing balance comment: requires bilateral support to maintain standing                    Cognition Arousal/Alertness: Awake/alert Behavior During Therapy: WFL for tasks assessed/performed Overall Cognitive Status: History of cognitive impairments - at baseline Area of Impairment: Memory     Memory: Decreased short-term memory       Problem Solving: Slow processing;Requires verbal cues      Exercises General Exercises - Lower Extremity Ankle Circles/Pumps: AROM;Both;10 reps;Supine Other Exercises Other Exercises: patellar mobilization Other Exercises: passive flexion and extension right knee before moving to EOB  General Comments General comments (skin integrity, edema, etc.): 96% O2 sat on RA, HR 76 bpm      Pertinent Vitals/Pain Pain Assessment: Faces Faces Pain Scale: Hurts even more Pain Location: right knee Pain Descriptors / Indicators: Aching Pain Intervention(s): Limited activity within patient's tolerance;Monitored during session;Repositioned    Home Living                      Prior  Function            PT Goals (current goals can now be found in the care plan section) Acute Rehab PT Goals Patient Stated Goal: go to rehab before home PT Goal Formulation: With patient/family Time For Goal Achievement: 08/11/16 Potential to Achieve Goals: Fair Progress towards PT goals: Progressing toward goals    Frequency    Min 3X/week      PT Plan Current plan remains appropriate    Co-evaluation             End of Session Equipment Utilized During Treatment: Gait belt Activity Tolerance: Patient limited by pain Patient left: in chair;with chair alarm set;with call bell/phone within reach;with family/visitor present     Time: 0923-0952 PT Time Calculation (min) (ACUTE ONLY): 29 min  Charges:  $Therapeutic Activity: 23-37 mins                    G Codes:     Leighton Roach, PT  Acute Rehab Services  [place] 07/31/2016, 10:17 AM

## 2016-07-31 NOTE — Care Management Note (Addendum)
Case Management Note  Patient Details  Name: Isaac Villa MRN: [id number] Date of Birth: 01-Jun-1925  Subjective/Objective:    Presents with symptomatic anemia.                Action/Plan: Plan is for possible d/c to SNF today. CSW managing disposition to SNF. CM monitoring as need presents.  Expected Discharge Date:     08/01/2016          Expected Discharge Plan:   SNF/ [place]  If discussed at [place] of Stay Meetings, dates discussed:    Additional Comments:  Sharin Mons, RN 07/31/2016, 11:03 AM

## 2016-07-31 NOTE — Care Management Important Message (Signed)
Important Message  Patient Details  Name: Isaac Villa MRN: [id number] Date of Birth: 20-Aug-1924   Medicare Important Message Given:  Yes    Nathen May 07/31/2016, 2:12 PM

## 2016-08-01 DIAGNOSIS — T796XXS Traumatic ischemia of muscle, sequela: Secondary | ICD-10-CM

## 2016-08-01 DIAGNOSIS — M6282 Rhabdomyolysis: Secondary | ICD-10-CM | POA: Diagnosis present

## 2016-08-01 DIAGNOSIS — R791 Abnormal coagulation profile: Secondary | ICD-10-CM

## 2016-08-01 DIAGNOSIS — S300XXD Contusion of lower back and pelvis, subsequent encounter: Secondary | ICD-10-CM

## 2016-08-01 DIAGNOSIS — S300XXA Contusion of lower back and pelvis, initial encounter: Secondary | ICD-10-CM | POA: Diagnosis present

## 2016-08-01 DIAGNOSIS — N179 Acute kidney failure, unspecified: Secondary | ICD-10-CM

## 2016-08-01 DIAGNOSIS — E875 Hyperkalemia: Secondary | ICD-10-CM

## 2016-08-01 LAB — CBC
HCT: 24.4 % — ABNORMAL LOW (ref 39.0–52.0)
HEMOGLOBIN: 7.8 g/dL — AB (ref 13.0–17.0)
MCH: 29.8 pg (ref 26.0–34.0)
MCHC: 32 g/dL (ref 30.0–36.0)
MCV: 93.1 fL (ref 78.0–100.0)
Platelets: 195 10*3/uL (ref 150–400)
RBC: 2.62 MIL/uL — AB (ref 4.22–5.81)
RDW: 15.7 % — ABNORMAL HIGH (ref 11.5–15.5)
WBC: 10.3 10*3/uL (ref 4.0–10.5)

## 2016-08-01 LAB — GLUCOSE, CAPILLARY
GLUCOSE-CAPILLARY: 165 mg/dL — AB (ref 65–99)
GLUCOSE-CAPILLARY: 183 mg/dL — AB (ref 65–99)

## 2016-08-01 MED ORDER — DOCUSATE SODIUM 100 MG PO CAPS: 100.0000 mg | ORAL_CAPSULE | Freq: Two times a day (BID) | ORAL | 0 refills | Status: AC

## 2016-08-01 MED ORDER — PREDNISONE 20 MG PO TABS: 40.0000 mg | ORAL_TABLET | Freq: Every day | ORAL | 0 refills | Status: DC

## 2016-08-01 MED ORDER — BOOST PLUS PO LIQD: 237.0000 mL | Freq: Three times a day (TID) | ORAL | 0 refills | Status: DC

## 2016-08-01 MED ORDER — HYDROCODONE-ACETAMINOPHEN 5-325 MG PO TABS: 1.0000 | ORAL_TABLET | Freq: Four times a day (QID) | ORAL | 0 refills | Status: DC | PRN

## 2016-08-01 NOTE — Discharge Summary (Signed)
Physician Discharge Summary  Isaac Villa [id number] DOB: 1925/05/23 DOA: 07/27/2016  PCP: [name], MD  Admit date: 07/27/2016 Discharge date: 08/01/2016  Admitted From: Home Disposition:  Skilled nursing facility [facility]  place)  Recommendations for Outpatient Follow-up:  1. Follow up with M.D. at SNF in 1 week. 2. Patient's Coumadin is on hold. please check H&h by 08/04/2016, if stable, resume his coumadin. 3. Please also check CPK within one week and if stable or improved from CPK done in the hospital last week please resume his statin.   Equipment/Devices:Per therapy at SNF  Discharge Condition: Stable CODE STATUS: Full code Diet recommendation: Heart Healthy     Discharge Diagnoses:  Principal Problem:   Symptomatic anemia  Active Problems:   Traumatic hematoma of buttock   Generalized weakness   Acute kidney injury (HCC)   Elevated INR   Essential hypertension, benign   Chronic anticoagulation   Atrial fibrillation (HCC) [I48.91]   Hyperkalemia   Hematoma   Rhabdomyolysis  Brief narrative/history of present illness 83 y.o.malewith medical history significant for hypertension, BPH, A. fib on Coumadin, presents to the emergency department with the chief complaint of worsening generalized weakness shortness of breath dizziness with position change. Pt slipped on ice about 3 days back and landed on his right hip. Initial evaluation reveals hg 6.4, potassium 5.9 worsening ecchymosis right flank. CT showed large hematoma over rt thigh. Ordered for 2 u PRBC, FFP and admitted to medical floor.  Hospital course    Principal Problem:   Symptomatic anemia secondary to large Right hip/thigh Hematoma from fall with therapeutic INR on anticoagulation.  Received 3 unit PRBC so far and FFP . Coumadin has been on hold and recommend checking H&H by 2/2 and if stable, Coumadin can be resumed.   Active Problems:  Right thigh hematoma Secondary to mechanical fall.  Wife denies frequent falls at home. Holding Coumadin. Bruising has spread up to the mid back and most of the posterior thigh. H&H low but has remained stable after 3 unit PRBC. Pain control with when necessary vicodin.    acute gouty arthritis Pain and swelling in the right knee. Uric acid normal. Patient reports similar symptoms with gout in the past. X-ray of the knee showing severe osteoarthritic changes. Ischium on 5 day course of oral prednisone. Symptoms improved this morning.    Acute rhabdomyolysis Possibly secondary to fall. CPK in 700s. Also contributing to pain in his thigh. Statin on hold. Please recheck CPK within one week and if improved, resume statin.     Essential hypertension, benign Resume home meds. Stable.      Atrial fibrillation (HCC) On chronic coumadin which is held and INR reversed for hematoma. Continue digoxin and Cardizem. In sinus rhythm on the monitor. CHADSVasc of 3.  His Coumadin is being held upon discharge and if H&H stable by the end of the week it can be resumed. Patient also had small bright red blood per rectum this morning which he attributes to chronic hemorrhoids so I have not resumed his Coumadin upon discharge.  I have discussed and explained the Patient and his family at length about risk and benefit of patient being off anticoagulation for almost a week.    Hyperkalemia Possibly due to rhabdomyolysis. Improved and stable. Resume ACE inhibitor and potassium supplement.  AKI Mild on admission, secondary to dehydration and rhabdomyolysis.Marland Kitchen ACEi and HCTZ held. Now resolved and resumed his antihypertensives.    generalized Weakness  seen by PT and recommend.SNF.  Anxiety and delirium. Symptoms of sundowning. Required when necessary Haldol while in the hospital. Has not been stable past 24 hours. Monitor the facility.  BPH Continue Flomax.  Symptoms of double vision Patient reports these symptoms ongoing for past  several months and attributes to his glaucoma. Reports falling with his ophthalmologist. Head CT done on 1/22 after he sustained a fall was negative for acute injury or bleed.  Family Communication  :  daughter  at bedside  Disposition Plan  : SNF    Consults  : none  Procedures  : CT pelvis   Discharge Instructions   Allergies as of 08/01/2016      Reactions   Enoxaparin Other (See Comments)   Reaction:  Internal bleeding   Penicillins Other (See Comments)   Reaction:  Causes pt to pass out  Has patient had a PCN reaction causing immediate rash, facial/tongue/throat swelling, SOB or lightheadedness with hypotension: Yes Has patient had a PCN reaction causing severe rash involving mucus membranes or skin necrosis: No Has patient had a PCN reaction that required hospitalization Yes Has patient had a PCN reaction occurring within the last 10 years: No If all of the above answers are "NO", then may proceed with Cephalosporin use.      Medication List    STOP taking these medications   atorvastatin 10 MG tablet Commonly known as:  LIPITOR   warfarin 4 MG tablet Commonly known as:  COUMADIN     TAKE these medications   acetaminophen 650 MG CR tablet Commonly known as:  TYLENOL Take 1,300 mg by mouth every 8 (eight) hours as needed for pain.   allopurinol 300 MG tablet Commonly known as:  ZYLOPRIM Take 300 mg by mouth daily at 12 noon.   Calcium + D3 600-200 MG-UNIT Tabs Take 1 tablet by mouth daily at 12 noon.   digoxin 0.125 MG tablet Commonly known as:  LANOXIN Take 0.125 mg by mouth daily.   diltiazem 240 MG 24 hr capsule Commonly known as:  CARDIZEM CD Take 240 mg by mouth daily at 12 noon.   docusate sodium 100 MG capsule Commonly known as:  COLACE Take 1 capsule (100 mg total) by mouth 2 (two) times daily.   finasteride 5 MG tablet Commonly known as:  PROSCAR Take 5 mg by mouth at bedtime.   hydrochlorothiazide 25 MG tablet Commonly known as:   HYDRODIURIL Take 25 mg by mouth daily.   HYDROcodone-acetaminophen 5-325 MG tablet Commonly known as:  NORCO/VICODIN Take 1 tablet by mouth every 6 (six) hours as needed for moderate pain.   lactose free nutrition Liqd Take 237 mLs by mouth 3 (three) times daily between meals.   latanoprost 0.005 % ophthalmic solution Commonly known as:  XALATAN Place 1 drop into both eyes at bedtime.   lisinopril 10 MG tablet Commonly known as:  PRINIVIL,ZESTRIL Take 1 tablet (10 mg total) by mouth daily.   MAGNESIUM-OXIDE 400 (241.3 Mg) MG tablet Generic drug:  magnesium oxide Take 400 mg by mouth daily at 12 noon.   multivitamin with minerals Tabs tablet Take 1 tablet by mouth daily at 12 noon.   omega-3 acid ethyl esters 1 g capsule Commonly known as:  LOVAZA Take 1 g by mouth daily at 12 noon.   potassium chloride SA 20 MEQ tablet Commonly known as:  K-DUR,KLOR-CON Take 20 mEq by mouth 2 (two) times daily.   predniSONE 20 MG tablet Commonly known as:  DELTASONE Take 2 tablets (40 mg total)  by mouth daily. Start taking on:  08/02/2016   tamsulosin 0.4 MG Caps capsule Commonly known as:  FLOMAX Take 0.4 mg by mouth daily.      Follow-up Information    MD at SNF in 1 week. Schedule an appointment as soon as possible for a visit in 1 week(s).          Allergies  Allergen Reactions  . Enoxaparin Other (See Comments)    Reaction:  Internal bleeding  . Penicillins Other (See Comments)    Reaction:  Causes pt to pass out  Has patient had a PCN reaction causing immediate rash, facial/tongue/throat swelling, SOB or lightheadedness with hypotension: Yes Has patient had a PCN reaction causing severe rash involving mucus membranes or skin necrosis: No Has patient had a PCN reaction that required hospitalization Yes Has patient had a PCN reaction occurring within the last 10 years: No If all of the above answers are "NO", then may proceed with Cephalosporin use.        Procedures/Studies: Ct Head Wo Contrast  Result Date: 07/24/2016 CLINICAL DATA:  Recent slip and fall with head injury and blood thinner usage, initial encounter EXAM: CT HEAD WITHOUT CONTRAST TECHNIQUE: Contiguous axial images were obtained from the base of the skull through the vertex without intravenous contrast. COMPARISON:  None. FINDINGS: Brain: Diffuse atrophic changes and chronic white matter ischemic changes noted. No findings to suggest acute hemorrhage, acute infarction or space-occupying mass lesion are noted. Vascular: No hyperdense vessel or unexpected calcification. Skull: Normal. Negative for fracture or focal lesion. Sinuses/Orbits: No acute finding. Other: None. IMPRESSION: Atrophic changes and chronic white matter ischemic change. No acute abnormality is noted. Electronically Signed   By: Inez Catalina M.D.   On: 07/24/2016 16:27   Ct Pelvis Wo Contrast  Result Date: 07/27/2016 CLINICAL DATA:  Right thigh and buttock hematoma following a fall on ice 3 days ago. EXAM: CT PELVIS WITHOUT CONTRAST TECHNIQUE: Multidetector CT imaging of the pelvis was performed following the standard protocol without intravenous contrast. COMPARISON:  07/24/2016. FINDINGS: Urinary Tract: Stable partially included right renal cyst and left renal complex cyst or solid mass. Enlarged prostate gland protruding into the base of the urinary bladder with associated mild diffuse bladder wall thickening. Bowel: Extensive sigmoid colon diverticulosis. No dilated bowel loops. Vascular/Lymphatic: Atheromatous arterial calcifications. Reproductive: Stable moderately to markedly enlarged prostate gland with mild heterogeneity. Other: Proximal left inguinal hernia containing fat and a portion of the sigmoid colon without obstruction. Progressive diffuse subcutaneous edema on the right. The previously demonstrated 10.2 x 6.0 cm right gluteus muscle hematoma currently measures 9.9 x 6.5 cm. Musculoskeletal: Extensive  lower lumbar spine facet degenerative changes and right L4 pars defect with associated grade 1 anterolisthesis at the L4-5 level. Mild right hip degenerative changes. Minimal left hip degenerative changes. No fracture or dislocation. IMPRESSION: 1. No fracture or dislocation. 2. Stable right gluteus muscle hematoma with increased associated subcutaneous edema. 3. Proximal left inguinal hernia containing fat and a small portion of the sigmoid colon without obstruction. 4. Stable moderately to markedly enlarged prostate gland with mild heterogeneity. 5. Mild diffuse bladder wall thickening compatible with chronic bladder outlet obstruction by the enlarged prostate gland. 6. Stable partially included left renal complex cyst or solid mass. This could be further evaluated with a pre and postcontrast CT or MRI if clinically indicated. 7. Lower lumbar spine degenerative changes and right L4 pars defect with associated grade 1 anterolisthesis at the L4-5 level. Electronically Signed  By: Claudie Revering M.D.   On: 07/27/2016 18:24   Ct Pelvis Wo Contrast  Result Date: 07/24/2016 CLINICAL DATA:  Slipped on ice at [employer], with acute onset of right hip pain. Initial encounter. EXAM: CT PELVIS WITHOUT CONTRAST TECHNIQUE: Multidetector CT imaging of the pelvis was performed following the standard protocol without intravenous contrast. COMPARISON:  None. FINDINGS: Urinary Tract: The bladder is mildly distended and grossly unremarkable in appearance, with impression on the base of bladder from the patient's significantly enlarged prostate. Bilateral renal cysts are seen, including a somewhat complex 4.9 cm left renal cyst with a slightly thickened wall and peripheral calcification. As this has very gradually grown from 1.8 cm in 2009, renal ultrasound or CT with contrast would be helpful for further evaluation, as deemed clinically appropriate. Bowel: Scattered diverticulosis is noted along the visualized portions of the  colon, without evidence of diverticulitis. Vascular/Lymphatic: Scattered calcification is seen along the distal abdominal aorta and its branches. No inguinal or pelvic sidewall lymphadenopathy is seen. Enlarged retroperitoneal nodes measure up to 1.3 cm in short axis, raising question for underlying malignancy. Reproductive: The prostate is significantly enlarged, measuring 6.0 cm in transverse dimension, with minimal calcification. Other: There is a large soft tissue hematoma within the right gluteus musculature, with overlying bruising. The hematoma measures nearly 14 x 10 x 6 cm in size. Musculoskeletal: There is no definite evidence of fracture. Mild degenerative change is noted at the lower lumbar spine, with underlying chronic right-sided pars defect at L4, and mild grade 1 anterolisthesis of L4 on L5. The remainder of the visualized musculature is grossly unremarkable in appearance. IMPRESSION: 1. No definite evidence of fracture at this time. 2. Large soft tissue hematoma at the right gluteus musculature, with overlying soft tissue bruising. The hematoma measures nearly 14 x 10 x 6 cm in size. 3. Enlarged retroperitoneal nodes, measuring up to 1.3 cm in short axis, raising question for underlying malignancy. Further evaluation could be considered as deemed clinically appropriate. 4. Significantly enlarged prostate.  Would correlate with PSA. 5. Somewhat complex 4.9 cm left renal cystic lesion demonstrates slightly thickened wall and peripheral calcification, and has very gradually grown from 1.8 cm in 2009. Renal ultrasound or CT with contrast would be helpful for further evaluation, if deemed clinically appropriate. 6. Chronic right-sided pars defect at L4, with mild grade 1 anterolisthesis of L4 on L5. Electronically Signed   By: Garald Balding M.D.   On: 07/24/2016 16:40   Dg Chest Port 1 View  Result Date: 07/30/2016 CLINICAL DATA:  Shortness of breath. EXAM: PORTABLE CHEST 1 VIEW COMPARISON:   11/07/2006 and prior chest radiographs FINDINGS: Cardiomegaly and mild pulmonary vascular congestion noted. Bibasilar atelectasis is identified. There is no evidence of focal airspace disease, pulmonary edema, suspicious pulmonary nodule/mass, pleural effusion, or pneumothorax. No acute bony abnormalities are identified. IMPRESSION: Cardiomegaly, mild pulmonary vascular congestion and bibasilar atelectasis. Electronically Signed   By: Margarette Canada M.D.   On: 07/30/2016 14:55   Dg Hip Unilat  With Pelvis 2-3 Views Right  Result Date: 07/24/2016 CLINICAL DATA:  Right hip pain after a fall today. Initial encounter. EXAM: DG HIP (WITH OR WITHOUT PELVIS) 2-3V RIGHT COMPARISON:  None. FINDINGS: Osteopenia. No fracture. Minimal collar osteophytosis along the right femoral head and minimal subchondral sclerosis in the right acetabulum. IMPRESSION: 1. No fracture or dislocation. 2. Minimal degenerative change in the right hip. Electronically Signed   By: Lorin Picket M.D.   On: 07/24/2016 13:51  Dg Knee 2 Views Right  Result Date: 07/31/2016 CLINICAL DATA:  Medial right knee pain made worse with physical activity. Medial swelling. No known injury. EXAM: RIGHT KNEE - 3 VIEW COMPARISON:  None in PACs FINDINGS: The bones are subjectively adequately mineralized. There is high-grade joint space loss of the medial compartment with a near bone on bone appearance. There is slight lateral subluxation of the tibial plateaus with respect to the femoral condyles. There is chondrocalcinosis of the menisci. There is beaking of the medial tibial spine. There are spurs arising from the articular margins of the tibial plateaus and the medial femoral condyle. There is moderate narrowing of the patellofemoral joint space. There may be an old transversely oriented fracture through the superior pole of the distal femur at the insertion of the quadriceps tendon. There is no definite acute effusion. There is some bony remodeling along  the anterior superior articular surface of the distal femur. IMPRESSION: There is moderate severe osteoarthritic joint space loss with a near bone on bone appearance involving the medial compartment. Moderate degenerative patellofemoral joint space loss. Electronically Signed   By: David  Martinique M.D.   On: 07/31/2016 12:00      Subjective: Feels better. No issues of sundowning last night. Denies pain in his right hip. Pain and swelling of his right knee much improved today.  Discharge Exam: Vitals:   08/01/16 0508 08/01/16 0915  BP: (!) 115/52 (!) 115/47  Pulse: (!) 59 61  Resp: 18   Temp: 98 F (36.7 C)    Vitals:   07/31/16 1518 07/31/16 2207 08/01/16 0508 08/01/16 0915  BP: (!) 110/51 (!) 121/51 (!) 115/52 (!) 115/47  Pulse: 62 69 (!) 59 61  Resp: 18 18 18    Temp: 98.4 F (36.9 C) 98.3 F (36.8 C) 98 F (36.7 C)   TempSrc:  Oral Oral   SpO2: 97% 95% 94% 100%  Weight:      Height:         Gen: not in distress HEENT:moist mucosa, supple neck Chest: clear b/l, no added sounds CVS: s1&s2 regular , no murmurs GI: soft, NT, ND,  Musculoskeletal: large hematoma over Right hip and thigh, Now involving right  back, nontender. Right knee swelling improved, nontender. CNS: alert and oriented    The results of significant diagnostics from this hospitalization (including imaging, microbiology, ancillary and laboratory) are listed below for reference.     Microbiology: No results found for this or any previous visit (from the past 240 hour(s)).   Labs: BNP (last 3 results) No results for input(s): BNP in the last 8760 hours. Basic Metabolic Panel:  Recent Labs Lab 07/27/16 1541 07/28/16 0806 07/30/16 0415  NA 135 138 136  K 5.9* 4.4 4.0  CL 103 107 103  CO2 25 25 25   GLUCOSE 075-GRM* 109* 125*  BUN 52* 41* 32*  CREATININE 1.41* 1.21 1.10  CALCIUM 8.4* 8.3* 8.0*   Liver Function Tests:  Recent Labs Lab 07/27/16 1541  AST 37  ALT 11*  ALKPHOS 54  BILITOT  0.9  PROT 5.0*  ALBUMIN 2.8*   No results for input(s): LIPASE, AMYLASE in the last 168 hours. No results for input(s): AMMONIA in the last 168 hours. CBC:  Recent Labs Lab 07/27/16 1541 07/28/16 0806 07/29/16 0604 07/30/16 0415 07/31/16 0555 08/01/16 0852  WBC 14.9* 11.8* 11.2* 10.5 11.2* 10.3  NEUTROABS 12.7* 9.5*  --   --   --   --   HGB 6.4* 7.3* 7.6*  7.6* 7.5* 7.8*  HCT 19.9* 22.4* 23.4* 23.3* 23.5* 24.4*  MCV 92.6 91.4 91.1 92.8 93.6 93.1  PLT 181 131* 145* 152 168 195   Cardiac Enzymes:  Recent Labs Lab 07/29/16 0823 07/29/16 1212  CKTOTAL 715* 657*   BNP: Invalid input(s): POCBNP CBG:  Recent Labs Lab 07/31/16 0839 07/31/16 1219 07/31/16 1731 07/31/16 2208 08/01/16 0738  GLUCAP 104* 145* 150* 137* 165*   D-Dimer No results for input(s): DDIMER in the last 72 hours. Hgb A1c No results for input(s): HGBA1C in the last 72 hours. Lipid Profile No results for input(s): CHOL, HDL, LDLCALC, TRIG, CHOLHDL, LDLDIRECT in the last 72 hours. Thyroid function studies No results for input(s): TSH, T4TOTAL, T3FREE, THYROIDAB in the last 72 hours.  Invalid input(s): FREET3 Anemia work up No results for input(s): VITAMINB12, FOLATE, FERRITIN, TIBC, IRON, RETICCTPCT in the last 72 hours. Urinalysis No results found for: COLORURINE, APPEARANCEUR, LABSPEC, [place], GLUCOSEU, HGBUR, BILIRUBINUR, KETONESUR, PROTEINUR, UROBILINOGEN, NITRITE, LEUKOCYTESUR Sepsis Labs Invalid input(s): PROCALCITONIN,  WBC,  LACTICIDVEN Microbiology No results found for this or any previous visit (from the past 240 hour(s)).   Time coordinating discharge: Over 30 minutes  SIGNED:   Louellen Molder, MD  [facility] 08/01/2016, 10:42 AM Pager   If 7PM-7AM, please contact night-coverage [email or web address] Password [email or web address]

## 2016-08-01 NOTE — Clinical Social Work Placement (Signed)
   CLINICAL SOCIAL WORK PLACEMENT  NOTE  Date:  08/01/2016  Patient Details  Name: Isaac Villa MRN: [id number] Date of Birth: 08/25/1924  Clinical Social Work is seeking post-discharge placement for this patient at the [place] level of care (*CSW will initial, date and re-position this form in  chart as items are completed):  Yes   Patient/family provided with [facility] Work Department's list of facilities offering this level of care within the geographic area requested by the patient (or if unable, by the patient's family).  Yes   Patient/family informed of their freedom to choose among providers that offer the needed level of care, that participate in Medicare, Medicaid or managed care program needed by the patient, have an available bed and are willing to accept the patient.  Yes   Patient/family informed of [facility]'s ownership interest in [facility] and [facility], as well as of the fact that they are under no obligation to receive care at these facilities.  PASRR submitted to EDS on 07/29/16     PASRR number received on 07/29/16     Existing PASRR number confirmed on       FL2 transmitted to all facilities in geographic area requested by pt/family on 07/29/16     FL2 transmitted to all facilities within larger geographic area on       Patient informed that his/her managed care company has contracts with or will negotiate with certain facilities, including the following:        Yes   Patient/family informed of bed offers received.  Patient chooses bed at [facility]     Physician recommends and patient chooses bed at      Patient to be transferred to [facility] on 08/01/16.  Patient to be transferred to facility by ptar     Patient family notified on 08/01/16 of transfer.  Name of family member notified:  wife and dtr at bedside     PHYSICIAN Please sign FL2     Additional Comment:     _______________________________________________ Jorge Ny, LCSW 08/01/2016, 12:17 PM

## 2016-08-01 NOTE — Discharge Instructions (Signed)
Hematoma A hematoma is a collection of blood. The collection of blood can turn into a hard, painful lump under the skin. Your skin may turn blue or yellow if the hematoma is close to the surface of the skin. Most hematomas get better in a few days to weeks. Some hematomas are serious and need medical care. Hematomas can be very small or very big. Follow these instructions at home:  Apply ice to the injured area: ? Put ice in a plastic bag. ? Place a towel between your skin and the bag. ? Leave the ice on for 20 minutes, 2-3 times a day for the first 1 to 2 days.  After the first 2 days, switch to using warm packs on the injured area.  Raise (elevate) the injured area to lessen pain and puffiness (swelling). You may also wrap the area with an elastic bandage. Make sure the bandage is not wrapped too tight.  If you have a painful hematoma on your leg or foot, you may use crutches for a couple days.  Only take medicines as told by your doctor. Get help right away if:  Your pain gets worse.  Your pain is not controlled with medicine.  You have a fever.  Your puffiness gets worse.  Your skin turns more blue or yellow.  Your skin over the hematoma breaks or starts bleeding.  Your hematoma is in your chest or belly (abdomen) and you are short of breath, feel weak, or have a change in consciousness.  Your hematoma is on your scalp and you have a headache that gets worse or a change in alertness or consciousness. This information is not intended to replace advice given to you by your health care provider. Make sure you discuss any questions you have with your health care provider. Document Released: 07/27/2004 Document Revised: 11/25/2015 Document Reviewed: 11/27/2012 Elsevier Interactive Patient Education  2017 [employer]  

## 2016-08-01 NOTE — Progress Notes (Signed)
Linus Mako to be D/C'd Rehab per MD order.  Discussed with the patient and all questions fully answered.  VSS, Skin clean, dry and intact without evidence of skin break down, no evidence of skin tears noted. IV catheter discontinued intact. Site without signs and symptoms of complications. Dressing and pressure applied.  An After Visit Summary was printed and given to the patient. Patient received prescription.  D/c education completed with patient/family including follow up instructions, medication list, d/c activities limitations if indicated, with other d/c instructions as indicated by MD - patient able to verbalize understanding, all questions fully answered.   Patient instructed to return to ED, call 911, or call MD for any changes in condition.     Betha Loa Kimiah Hibner 08/01/2016 12:51 PM

## 2016-08-01 NOTE — Progress Notes (Signed)
Patient had blood bowel movement. states he usually does because of external hemoroids. Provider notified. HGB still at 7.8.

## 2016-08-01 NOTE — Progress Notes (Addendum)
Patient will discharge to [facility] Anticipated discharge date: 1/30 Family notified: wife and dtr at bedside Transportation by [facility]- scheduled for 1pm Report #: [phone]  CSW signing off.  Jorge Ny, LCSW Clinical Social Worker [phone]

## 2016-08-02 ENCOUNTER — Inpatient Hospital Stay (HOSPITAL_COMMUNITY)
Admission: EM | Admit: 2016-08-02 | Discharge: 2016-08-08 | DRG: 469 | Disposition: A | Discharge status: Rehabilitation Facility | Payer: Medicare Other | Attending: Internal Medicine | Admitting: Internal Medicine

## 2016-08-02 ENCOUNTER — Emergency Department (HOSPITAL_COMMUNITY): Payer: Medicare Other

## 2016-08-02 ENCOUNTER — Encounter (HOSPITAL_COMMUNITY): Payer: Self-pay | Admitting: *Deleted

## 2016-08-02 DIAGNOSIS — D62 Acute posthemorrhagic anemia: Secondary | ICD-10-CM | POA: Diagnosis present

## 2016-08-02 DIAGNOSIS — R5381 Other malaise: Secondary | ICD-10-CM | POA: Diagnosis not present

## 2016-08-02 DIAGNOSIS — S300XXD Contusion of lower back and pelvis, subsequent encounter: Secondary | ICD-10-CM | POA: Diagnosis not present

## 2016-08-02 DIAGNOSIS — Z7901 Long term (current) use of anticoagulants: Secondary | ICD-10-CM

## 2016-08-02 DIAGNOSIS — E43 Unspecified severe protein-calorie malnutrition: Secondary | ICD-10-CM | POA: Diagnosis not present

## 2016-08-02 DIAGNOSIS — F05 Delirium due to known physiological condition: Secondary | ICD-10-CM | POA: Diagnosis not present

## 2016-08-02 DIAGNOSIS — D72829 Elevated white blood cell count, unspecified: Secondary | ICD-10-CM | POA: Diagnosis not present

## 2016-08-02 DIAGNOSIS — M109 Gout, unspecified: Secondary | ICD-10-CM | POA: Diagnosis present

## 2016-08-02 DIAGNOSIS — Z419 Encounter for procedure for purposes other than remedying health state, unspecified: Secondary | ICD-10-CM

## 2016-08-02 DIAGNOSIS — M1610 Unilateral primary osteoarthritis, unspecified hip: Secondary | ICD-10-CM | POA: Diagnosis not present

## 2016-08-02 DIAGNOSIS — W010XXA Fall on same level from slipping, tripping and stumbling without subsequent striking against object, initial encounter: Secondary | ICD-10-CM | POA: Diagnosis present

## 2016-08-02 DIAGNOSIS — Z880 Allergy status to penicillin: Secondary | ICD-10-CM

## 2016-08-02 DIAGNOSIS — Z87891 Personal history of nicotine dependence: Secondary | ICD-10-CM

## 2016-08-02 DIAGNOSIS — Z6822 Body mass index (BMI) 22.0-22.9, adult: Secondary | ICD-10-CM | POA: Diagnosis not present

## 2016-08-02 DIAGNOSIS — R0989 Other specified symptoms and signs involving the circulatory and respiratory systems: Secondary | ICD-10-CM | POA: Diagnosis not present

## 2016-08-02 DIAGNOSIS — E46 Unspecified protein-calorie malnutrition: Secondary | ICD-10-CM | POA: Diagnosis not present

## 2016-08-02 DIAGNOSIS — Y92121 Bathroom in nursing home as the place of occurrence of the external cause: Secondary | ICD-10-CM | POA: Diagnosis not present

## 2016-08-02 DIAGNOSIS — R296 Repeated falls: Secondary | ICD-10-CM | POA: Diagnosis present

## 2016-08-02 DIAGNOSIS — I959 Hypotension, unspecified: Secondary | ICD-10-CM | POA: Diagnosis not present

## 2016-08-02 DIAGNOSIS — Z7952 Long term (current) use of systemic steroids: Secondary | ICD-10-CM

## 2016-08-02 DIAGNOSIS — E440 Moderate protein-calorie malnutrition: Secondary | ICD-10-CM

## 2016-08-02 DIAGNOSIS — S300XXA Contusion of lower back and pelvis, initial encounter: Secondary | ICD-10-CM | POA: Diagnosis present

## 2016-08-02 DIAGNOSIS — Z888 Allergy status to other drugs, medicaments and biological substances status: Secondary | ICD-10-CM

## 2016-08-02 DIAGNOSIS — R410 Disorientation, unspecified: Secondary | ICD-10-CM

## 2016-08-02 DIAGNOSIS — S72002A Fracture of unspecified part of neck of left femur, initial encounter for closed fracture: Principal | ICD-10-CM | POA: Diagnosis present

## 2016-08-02 DIAGNOSIS — E785 Hyperlipidemia, unspecified: Secondary | ICD-10-CM | POA: Diagnosis not present

## 2016-08-02 DIAGNOSIS — Z96642 Presence of left artificial hip joint: Secondary | ICD-10-CM

## 2016-08-02 DIAGNOSIS — I950 Idiopathic hypotension: Secondary | ICD-10-CM | POA: Diagnosis not present

## 2016-08-02 DIAGNOSIS — E876 Hypokalemia: Secondary | ICD-10-CM | POA: Diagnosis not present

## 2016-08-02 DIAGNOSIS — M25512 Pain in left shoulder: Secondary | ICD-10-CM | POA: Diagnosis not present

## 2016-08-02 DIAGNOSIS — G4700 Insomnia, unspecified: Secondary | ICD-10-CM | POA: Diagnosis not present

## 2016-08-02 DIAGNOSIS — M79632 Pain in left forearm: Secondary | ICD-10-CM | POA: Diagnosis not present

## 2016-08-02 DIAGNOSIS — G8911 Acute pain due to trauma: Secondary | ICD-10-CM | POA: Diagnosis not present

## 2016-08-02 DIAGNOSIS — Z9842 Cataract extraction status, left eye: Secondary | ICD-10-CM

## 2016-08-02 DIAGNOSIS — Z85820 Personal history of malignant melanoma of skin: Secondary | ICD-10-CM | POA: Diagnosis not present

## 2016-08-02 DIAGNOSIS — F19921 Other psychoactive substance use, unspecified with intoxication with delirium: Secondary | ICD-10-CM | POA: Diagnosis not present

## 2016-08-02 DIAGNOSIS — M79652 Pain in left thigh: Secondary | ICD-10-CM | POA: Diagnosis not present

## 2016-08-02 DIAGNOSIS — D181 Lymphangioma, any site: Secondary | ICD-10-CM | POA: Diagnosis not present

## 2016-08-02 DIAGNOSIS — Z8042 Family history of malignant neoplasm of prostate: Secondary | ICD-10-CM

## 2016-08-02 DIAGNOSIS — H409 Unspecified glaucoma: Secondary | ICD-10-CM | POA: Diagnosis present

## 2016-08-02 DIAGNOSIS — S065X0A Traumatic subdural hemorrhage without loss of consciousness, initial encounter: Secondary | ICD-10-CM | POA: Diagnosis not present

## 2016-08-02 DIAGNOSIS — I77810 Thoracic aortic ectasia: Secondary | ICD-10-CM | POA: Diagnosis not present

## 2016-08-02 DIAGNOSIS — I10 Essential (primary) hypertension: Secondary | ICD-10-CM | POA: Diagnosis not present

## 2016-08-02 DIAGNOSIS — S72002P Fracture of unspecified part of neck of left femur, subsequent encounter for closed fracture with malunion: Secondary | ICD-10-CM | POA: Diagnosis not present

## 2016-08-02 DIAGNOSIS — I480 Paroxysmal atrial fibrillation: Secondary | ICD-10-CM | POA: Diagnosis not present

## 2016-08-02 DIAGNOSIS — Z8249 Family history of ischemic heart disease and other diseases of the circulatory system: Secondary | ICD-10-CM | POA: Diagnosis not present

## 2016-08-02 DIAGNOSIS — N400 Enlarged prostate without lower urinary tract symptoms: Secondary | ICD-10-CM | POA: Diagnosis not present

## 2016-08-02 DIAGNOSIS — W19XXXA Unspecified fall, initial encounter: Secondary | ICD-10-CM | POA: Diagnosis not present

## 2016-08-02 DIAGNOSIS — M25562 Pain in left knee: Secondary | ICD-10-CM | POA: Diagnosis not present

## 2016-08-02 DIAGNOSIS — D649 Anemia, unspecified: Secondary | ICD-10-CM | POA: Diagnosis not present

## 2016-08-02 DIAGNOSIS — S79911A Unspecified injury of right hip, initial encounter: Secondary | ICD-10-CM | POA: Diagnosis not present

## 2016-08-02 DIAGNOSIS — S72042A Displaced fracture of base of neck of left femur, initial encounter for closed fracture: Secondary | ICD-10-CM | POA: Diagnosis not present

## 2016-08-02 DIAGNOSIS — M25552 Pain in left hip: Secondary | ICD-10-CM | POA: Diagnosis not present

## 2016-08-02 DIAGNOSIS — S0990XA Unspecified injury of head, initial encounter: Secondary | ICD-10-CM | POA: Diagnosis not present

## 2016-08-02 DIAGNOSIS — I482 Chronic atrial fibrillation, unspecified: Secondary | ICD-10-CM | POA: Diagnosis present

## 2016-08-02 DIAGNOSIS — M79622 Pain in left upper arm: Secondary | ICD-10-CM | POA: Diagnosis not present

## 2016-08-02 DIAGNOSIS — M79609 Pain in unspecified limb: Secondary | ICD-10-CM | POA: Diagnosis not present

## 2016-08-02 DIAGNOSIS — E7800 Pure hypercholesterolemia, unspecified: Secondary | ICD-10-CM | POA: Diagnosis present

## 2016-08-02 DIAGNOSIS — S72009A Fracture of unspecified part of neck of unspecified femur, initial encounter for closed fracture: Secondary | ICD-10-CM | POA: Diagnosis present

## 2016-08-02 DIAGNOSIS — K5900 Constipation, unspecified: Secondary | ICD-10-CM | POA: Diagnosis not present

## 2016-08-02 DIAGNOSIS — N390 Urinary tract infection, site not specified: Secondary | ICD-10-CM | POA: Diagnosis not present

## 2016-08-02 DIAGNOSIS — Z9841 Cataract extraction status, right eye: Secondary | ICD-10-CM | POA: Diagnosis not present

## 2016-08-02 DIAGNOSIS — D638 Anemia in other chronic diseases classified elsewhere: Secondary | ICD-10-CM | POA: Diagnosis not present

## 2016-08-02 DIAGNOSIS — Z96652 Presence of left artificial knee joint: Secondary | ICD-10-CM | POA: Diagnosis present

## 2016-08-02 DIAGNOSIS — I4891 Unspecified atrial fibrillation: Secondary | ICD-10-CM | POA: Diagnosis not present

## 2016-08-02 DIAGNOSIS — R630 Anorexia: Secondary | ICD-10-CM | POA: Diagnosis not present

## 2016-08-02 DIAGNOSIS — R638 Other symptoms and signs concerning food and fluid intake: Secondary | ICD-10-CM | POA: Diagnosis not present

## 2016-08-02 DIAGNOSIS — F4489 Other dissociative and conversion disorders: Secondary | ICD-10-CM

## 2016-08-02 DIAGNOSIS — M1612 Unilateral primary osteoarthritis, left hip: Secondary | ICD-10-CM | POA: Diagnosis not present

## 2016-08-02 DIAGNOSIS — E8809 Other disorders of plasma-protein metabolism, not elsewhere classified: Secondary | ICD-10-CM | POA: Diagnosis not present

## 2016-08-02 DIAGNOSIS — Z79899 Other long term (current) drug therapy: Secondary | ICD-10-CM | POA: Diagnosis not present

## 2016-08-02 DIAGNOSIS — R319 Hematuria, unspecified: Secondary | ICD-10-CM | POA: Diagnosis not present

## 2016-08-02 DIAGNOSIS — Z471 Aftercare following joint replacement surgery: Secondary | ICD-10-CM | POA: Diagnosis not present

## 2016-08-02 DIAGNOSIS — R0781 Pleurodynia: Secondary | ICD-10-CM | POA: Diagnosis not present

## 2016-08-02 DIAGNOSIS — B9620 Unspecified Escherichia coli [E. coli] as the cause of diseases classified elsewhere: Secondary | ICD-10-CM | POA: Diagnosis not present

## 2016-08-02 HISTORY — DX: Fracture of unspecified part of neck of unspecified femur, initial encounter for closed fracture: S72.009A

## 2016-08-02 LAB — BASIC METABOLIC PANEL
Anion gap: 9 (ref 5–15)
BUN: 51 mg/dL — ABNORMAL HIGH (ref 6–20)
CALCIUM: 8.5 mg/dL — AB (ref 8.9–10.3)
CO2: 26 mmol/L (ref 22–32)
CREATININE: 0.92 mg/dL (ref 0.61–1.24)
Chloride: 101 mmol/L (ref 101–111)
GFR calc non Af Amer: >60 mL/min (ref >60)
Glucose, Bld: 154 mg/dL — ABNORMAL HIGH (ref 65–99)
Potassium: 4 mmol/L (ref 3.5–5.1)
SODIUM: 136 mmol/L (ref 135–145)

## 2016-08-02 LAB — FOLATE: FOLATE: 20.9 ng/mL (ref >5.9)

## 2016-08-02 LAB — CBC WITH DIFFERENTIAL/PLATELET
BASOS PCT: 0 %
Basophils Absolute: 0 10*3/uL (ref 0.0–0.1)
EOS ABS: 0 10*3/uL (ref 0.0–0.7)
EOS PCT: 0 %
HCT: 24 % — ABNORMAL LOW (ref 39.0–52.0)
HEMOGLOBIN: 7.7 g/dL — AB (ref 13.0–17.0)
Lymphocytes Relative: 3 %
Lymphs Abs: 0.5 10*3/uL — ABNORMAL LOW (ref 0.7–4.0)
MCH: 29.8 pg (ref 26.0–34.0)
MCHC: 32.1 g/dL (ref 30.0–36.0)
MCV: 93 fL (ref 78.0–100.0)
MONOS PCT: 8 %
Monocytes Absolute: 1.3 10*3/uL — ABNORMAL HIGH (ref 0.1–1.0)
NEUTROS PCT: 89 %
Neutro Abs: 15.1 10*3/uL — ABNORMAL HIGH (ref 1.7–7.7)
PLATELETS: 246 10*3/uL (ref 150–400)
RBC: 2.58 MIL/uL — ABNORMAL LOW (ref 4.22–5.81)
RDW: 15.6 % — ABNORMAL HIGH (ref 11.5–15.5)
WBC: 17 10*3/uL — AB (ref 4.0–10.5)

## 2016-08-02 LAB — IRON AND TIBC
Iron: 47 ug/dL (ref 45–182)
Saturation Ratios: 25 % (ref 17.9–39.5)
TIBC: 192 ug/dL — ABNORMAL LOW (ref 250–450)
UIBC: 145 ug/dL

## 2016-08-02 LAB — PREPARE RBC (CROSSMATCH)

## 2016-08-02 LAB — FERRITIN: Ferritin: 673 ng/mL — ABNORMAL HIGH (ref 24–336)

## 2016-08-02 LAB — RETICULOCYTES
RBC.: 2.77 MIL/uL — AB (ref 4.22–5.81)
RETIC COUNT ABSOLUTE: 144 10*3/uL (ref 19.0–186.0)
Retic Ct Pct: 5.2 % — ABNORMAL HIGH (ref 0.4–3.1)

## 2016-08-02 LAB — PROTIME-INR
INR: 1.18
PROTHROMBIN TIME: 15 s (ref 11.4–15.2)

## 2016-08-02 LAB — VITAMIN B12: VITAMIN B 12: 884 pg/mL (ref 180–914)

## 2016-08-02 LAB — PREALBUMIN: Prealbumin: 9.8 mg/dL — ABNORMAL LOW (ref 18–38)

## 2016-08-02 MED ORDER — ALLOPURINOL 300 MG PO TABS
300.0000 mg | ORAL_TABLET | Freq: Every day | ORAL | Status: DC
  Administered 2016-08-03 – 2016-08-08 (×3): 300 mg via ORAL
  Filled 2016-08-02 (×5): qty 1

## 2016-08-02 MED ORDER — DIGOXIN 125 MCG PO TABS
0.1250 mg | ORAL_TABLET | Freq: Every day | ORAL | Status: DC
  Administered 2016-08-03 – 2016-08-08 (×5): 0.125 mg via ORAL
  Filled 2016-08-02 (×5): qty 1

## 2016-08-02 MED ORDER — SODIUM CHLORIDE 0.9% FLUSH: 3.0000 mL | INTRAVENOUS | Status: DC | PRN

## 2016-08-02 MED ORDER — TAMSULOSIN HCL 0.4 MG PO CAPS
0.4000 mg | ORAL_CAPSULE | Freq: Every day | ORAL | Status: DC
  Administered 2016-08-03 – 2016-08-08 (×4): 0.4 mg via ORAL
  Filled 2016-08-02 (×5): qty 1

## 2016-08-02 MED ORDER — HYDROCHLOROTHIAZIDE 25 MG PO TABS
25.0000 mg | ORAL_TABLET | Freq: Every day | ORAL | Status: DC
  Administered 2016-08-03 – 2016-08-08 (×4): 25 mg via ORAL
  Filled 2016-08-02 (×5): qty 1

## 2016-08-02 MED ORDER — LISINOPRIL 10 MG PO TABS
10.0000 mg | ORAL_TABLET | Freq: Every day | ORAL | Status: DC
  Administered 2016-08-03 – 2016-08-04 (×2): 10 mg via ORAL
  Filled 2016-08-02 (×2): qty 1

## 2016-08-02 MED ORDER — HYDROMORPHONE HCL 2 MG/ML IJ SOLN: 1.0000 mg | INTRAMUSCULAR | Status: DC | PRN

## 2016-08-02 MED ORDER — ACETAMINOPHEN 325 MG PO TABS
650.0000 mg | ORAL_TABLET | Freq: Four times a day (QID) | ORAL | Status: DC | PRN
  Administered 2016-08-03 – 2016-08-04 (×3): 650 mg via ORAL
  Filled 2016-08-02 (×4): qty 2

## 2016-08-02 MED ORDER — MORPHINE SULFATE (PF) 4 MG/ML IV SOLN: 2.0000 mg | INTRAVENOUS | Status: DC | PRN

## 2016-08-02 MED ORDER — ONDANSETRON HCL 4 MG/2ML IJ SOLN: 4.0000 mg | Freq: Four times a day (QID) | INTRAMUSCULAR | Status: DC | PRN

## 2016-08-02 MED ORDER — POTASSIUM CHLORIDE CRYS ER 20 MEQ PO TBCR
20.0000 meq | EXTENDED_RELEASE_TABLET | Freq: Two times a day (BID) | ORAL | Status: DC
  Administered 2016-08-02 – 2016-08-04 (×4): 20 meq via ORAL
  Filled 2016-08-02 (×4): qty 1

## 2016-08-02 MED ORDER — LORAZEPAM 2 MG/ML IJ SOLN
0.5000 mg | INTRAMUSCULAR | Status: DC | PRN
  Administered 2016-08-04: 0.5 mg via INTRAVENOUS
  Filled 2016-08-02: qty 1

## 2016-08-02 MED ORDER — FENTANYL CITRATE (PF) 100 MCG/2ML IJ SOLN: 12.5000 ug | INTRAMUSCULAR | Status: DC | PRN

## 2016-08-02 MED ORDER — DILTIAZEM HCL ER COATED BEADS 240 MG PO CP24
240.0000 mg | ORAL_CAPSULE | Freq: Every day | ORAL | Status: DC
  Administered 2016-08-03 – 2016-08-08 (×5): 240 mg via ORAL
  Filled 2016-08-02 (×5): qty 1

## 2016-08-02 MED ORDER — SODIUM CHLORIDE 0.9 % IV SOLN: Freq: Once | INTRAVENOUS | Status: DC

## 2016-08-02 MED ORDER — HALOPERIDOL LACTATE 5 MG/ML IJ SOLN: 5.0000 mg | Freq: Four times a day (QID) | INTRAMUSCULAR | Status: DC | PRN

## 2016-08-02 MED ORDER — SODIUM CHLORIDE 0.9% FLUSH
3.0000 mL | Freq: Two times a day (BID) | INTRAVENOUS | Status: DC
  Administered 2016-08-03 – 2016-08-08 (×8): 3 mL via INTRAVENOUS

## 2016-08-02 MED ORDER — OXYCODONE HCL 5 MG PO TABS: 5.0000 mg | ORAL_TABLET | ORAL | Status: DC | PRN

## 2016-08-02 MED ORDER — LACTATED RINGERS IV SOLN: INTRAVENOUS | Status: DC

## 2016-08-02 MED ORDER — CHLORHEXIDINE GLUCONATE 4 % EX LIQD
60.0000 mL | Freq: Once | CUTANEOUS | Status: AC
  Administered 2016-08-03: 4 via TOPICAL
  Filled 2016-08-02: qty 60

## 2016-08-02 MED ORDER — METHOCARBAMOL 1000 MG/10ML IJ SOLN
500.0000 mg | Freq: Four times a day (QID) | INTRAMUSCULAR | Status: DC | PRN
  Filled 2016-08-02 (×2): qty 5

## 2016-08-02 MED ORDER — DOCUSATE SODIUM 100 MG PO CAPS
100.0000 mg | ORAL_CAPSULE | Freq: Two times a day (BID) | ORAL | Status: DC
  Administered 2016-08-02 – 2016-08-07 (×7): 100 mg via ORAL
  Filled 2016-08-02 (×10): qty 1

## 2016-08-02 MED ORDER — SODIUM CHLORIDE 0.9 % IV SOLN
INTRAVENOUS | Status: DC
  Administered 2016-08-02: 21:00:00 via INTRAVENOUS

## 2016-08-02 MED ORDER — FINASTERIDE 5 MG PO TABS
5.0000 mg | ORAL_TABLET | Freq: Every day | ORAL | Status: DC
  Administered 2016-08-04 – 2016-08-06 (×2): 5 mg via ORAL
  Filled 2016-08-02 (×3): qty 1

## 2016-08-02 MED ORDER — HYDROCODONE-ACETAMINOPHEN 5-325 MG PO TABS: 1.0000 | ORAL_TABLET | Freq: Four times a day (QID) | ORAL | Status: DC | PRN

## 2016-08-02 MED ORDER — TRAZODONE HCL 50 MG PO TABS
50.0000 mg | ORAL_TABLET | Freq: Every day | ORAL | Status: DC
  Administered 2016-08-02 – 2016-08-06 (×4): 50 mg via ORAL
  Filled 2016-08-02 (×4): qty 1

## 2016-08-02 MED ORDER — METHOCARBAMOL 500 MG PO TABS
500.0000 mg | ORAL_TABLET | Freq: Four times a day (QID) | ORAL | Status: DC | PRN
  Administered 2016-08-03: 500 mg via ORAL
  Filled 2016-08-02 (×2): qty 1

## 2016-08-02 MED ORDER — VANCOMYCIN HCL IN DEXTROSE 1-5 GM/200ML-% IV SOLN
1000.0000 mg | INTRAVENOUS | Status: DC
  Filled 2016-08-02: qty 200

## 2016-08-02 MED ORDER — SODIUM CHLORIDE 0.9 % IV SOLN: 250.0000 mL | INTRAVENOUS | Status: DC | PRN

## 2016-08-02 MED ORDER — BOOST PLUS PO LIQD
237.0000 mL | Freq: Three times a day (TID) | ORAL | Status: DC
  Administered 2016-08-02 – 2016-08-08 (×11): 237 mL via ORAL
  Filled 2016-08-02 (×22): qty 237

## 2016-08-02 MED ORDER — MAGNESIUM OXIDE 400 (241.3 MG) MG PO TABS
400.0000 mg | ORAL_TABLET | Freq: Every day | ORAL | Status: DC
  Administered 2016-08-02 – 2016-08-08 (×5): 400 mg via ORAL
  Filled 2016-08-02 (×6): qty 1

## 2016-08-02 MED ORDER — LATANOPROST 0.005 % OP SOLN
1.0000 [drp] | Freq: Every day | OPHTHALMIC | Status: DC
  Administered 2016-08-02 – 2016-08-07 (×6): 1 [drp] via OPHTHALMIC
  Filled 2016-08-02: qty 2.5

## 2016-08-02 MED ORDER — ACETAMINOPHEN 650 MG RE SUPP: 650.0000 mg | Freq: Four times a day (QID) | RECTAL | Status: DC | PRN

## 2016-08-02 MED ORDER — ONDANSETRON HCL 4 MG PO TABS: 4.0000 mg | ORAL_TABLET | Freq: Four times a day (QID) | ORAL | Status: DC | PRN

## 2016-08-02 MED ORDER — POVIDONE-IODINE 10 % EX SWAB: 2.0000 "application " | Freq: Once | CUTANEOUS | Status: DC

## 2016-08-02 MED ORDER — ACETAMINOPHEN-CODEINE #3 300-30 MG PO TABS
1.0000 | ORAL_TABLET | Freq: Four times a day (QID) | ORAL | Status: DC | PRN
  Administered 2016-08-02 – 2016-08-03 (×2): 1 via ORAL
  Filled 2016-08-02 (×2): qty 1

## 2016-08-02 MED ORDER — OMEGA-3-ACID ETHYL ESTERS 1 G PO CAPS
1.0000 g | ORAL_CAPSULE | Freq: Every day | ORAL | Status: DC
  Administered 2016-08-03 – 2016-08-08 (×3): 1 g via ORAL
  Filled 2016-08-02 (×4): qty 1

## 2016-08-02 NOTE — H&P (Addendum)
History and Physical    Isaac Villa [id number] DOB: 1924-09-16 DOA: 08/02/2016  PCP: [name], MD Patient coming from: [facility]  Chief Complaint: L hip pain  HPI: Isaac Villa is a 83 y.o. male with medical history significant of atrophic fibrillation, BPH, HLD, glaucoma, gout, HTN, melanoma. Of note patient may recently admitted for approximately 5 day stay due to symptomatic anemia secondary to right thigh hematoma which was sustained after a mechanical fall on anticoagulation with supratherapeutic INR. Patient had been discharged from the hospital for 24 hours prior to readmission. Patient reports ambulating across the tile floor in an effort to use the bathroom when his feet slipped from underneath him. Patient fell to the ground striking his left hip. Unsure if he struck his head but denies any specific focal pain complaints outside of his left hip, chest pain, palpitations, nausea, vomiting, shortness of breath, fevers, abdominal pain, dysuria, frequency, back pain. Pain in left hip is constant. Worse with movement.  ED Course: objective findings outlined below.   Review of Systems: As per HPI otherwise 10 point review of systems negative.   Ambulatory Status: requires a walker  Past Medical History:  Diagnosis Date  . Atrial fibrillation (HCC)    Chronic  . BPH (benign prostatic hypertrophy)   . Edema extremities 06/04/2014  . Elevated cholesterol   . Glaucoma   . Gout   . Hernia   . Herniated disc   . Hypertension   . Melanoma ([place])   . Melanoma ([place])   . Thrombocytopenia ([place])     Past Surgical History:  Procedure Laterality Date  . APPENDECTOMY    . CATARACT EXTRACTION Bilateral 04/2008  . EYE SURGERY     lens replacements  . HERNIA REPAIR    . JOINT REPLACEMENT     left knee  . [place] SURGERY  07/2004  . REPLACEMENT TOTAL KNEE Left 11/2006  . TONSILLECTOMY      Social History   Social History  . Marital status: Married   Spouse name: N/A  . Number of children: N/A  . Years of education: N/A   Occupational History  . Not on file.   Social History Main Topics  . Smoking status: Former Research scientist (life sciences)  . Smokeless tobacco: Never Used  . Alcohol use Yes     Comment: 3-4 glasses of wine per week  . Drug use: No  . Sexual activity: Not on file   Other Topics Concern  . Not on file   Social History Narrative  . No narrative on file    Allergies  Allergen Reactions  . Enoxaparin Other (See Comments)    Reaction:  Internal bleeding  . Penicillins Other (See Comments)    Reaction:  Causes pt to pass out  Has patient had a PCN reaction causing immediate rash, facial/tongue/throat swelling, SOB or lightheadedness with hypotension: Yes Has patient had a PCN reaction causing severe rash involving mucus membranes or skin necrosis: No Has patient had a PCN reaction that required hospitalization Yes Has patient had a PCN reaction occurring within the last 10 years: No If all of the above answers are "NO", then may proceed with Cephalosporin use.    Family History  Problem Relation Age of Onset  . Hypertension Mother   . Prostate cancer Father     Prior to Admission medications   Medication Sig Start Date End Date Taking? Authorizing Provider  acetaminophen (TYLENOL) 650 MG CR tablet Take 1,300 mg by mouth  every 8 (eight) hours as needed for pain.    Historical Provider, MD  allopurinol (ZYLOPRIM) 300 MG tablet Take 300 mg by mouth daily at 12 noon.     Historical Provider, MD  Calcium Carb-Cholecalciferol (CALCIUM + D3) 600-200 MG-UNIT TABS Take 1 tablet by mouth daily at 12 noon.     Historical Provider, MD  digoxin (LANOXIN) 0.125 MG tablet Take 0.125 mg by mouth daily.    Historical Provider, MD  diltiazem (CARDIZEM CD) 240 MG 24 hr capsule Take 240 mg by mouth daily at 12 noon.    Historical Provider, MD  docusate sodium (COLACE) 100 MG capsule Take 1 capsule (100 mg total) by mouth 2 (two) times daily.  08/01/16   Nishant Dhungel, MD  finasteride (PROSCAR) 5 MG tablet Take 5 mg by mouth at bedtime.     Historical Provider, MD  hydrochlorothiazide (HYDRODIURIL) 25 MG tablet Take 25 mg by mouth daily.    Historical Provider, MD  HYDROcodone-acetaminophen (NORCO/VICODIN) 5-325 MG tablet Take 1 tablet by mouth every 6 (six) hours as needed for moderate pain. 08/01/16   Nishant Dhungel, MD  lactose free nutrition (BOOST PLUS) LIQD Take 237 mLs by mouth 3 (three) times daily between meals. 08/01/16   Nishant Dhungel, MD  latanoprost (XALATAN) 0.005 % ophthalmic solution Place 1 drop into both eyes at bedtime.     Historical Provider, MD  lisinopril (PRINIVIL,ZESTRIL) 10 MG tablet Take 1 tablet (10 mg total) by mouth daily. 12/23/14   Sueanne Margarita, MD  magnesium oxide (MAGNESIUM-OXIDE) 400 (241.3 Mg) MG tablet Take 400 mg by mouth daily at 12 noon.    Historical Provider, MD  Multiple Vitamin (MULTIVITAMIN WITH MINERALS) TABS tablet Take 1 tablet by mouth daily at 12 noon.    Historical Provider, MD  omega-3 acid ethyl esters (LOVAZA) 1 g capsule Take 1 g by mouth daily at 12 noon.    Historical Provider, MD  potassium chloride SA (K-DUR,KLOR-CON) 20 MEQ tablet Take 20 mEq by mouth 2 (two) times daily.    Historical Provider, MD  predniSONE (DELTASONE) 20 MG tablet Take 2 tablets (40 mg total) by mouth daily. 08/02/16 08/06/16  Nishant Dhungel, MD  tamsulosin (FLOMAX) 0.4 MG CAPS capsule Take 0.4 mg by mouth daily.    Historical Provider, MD    Physical Exam: Vitals:   08/02/16 1154 08/02/16 1422  BP: (!) 108/47 105/58  Pulse: 69 (!) 38  Resp: 18 21  Temp: 97.8 F (36.6 C)   TempSrc: Oral   SpO2: 100% 99%     General:  Appears calm and comfortable Eyes:  PERRL, EOMI, normal lids, iris ENT:  grossly normal hearing, lips & tongue, mmm Neck:  no LAD, masses or thyromegaly Cardiovascular:  RRR, III/VI systolic murmur. No LE edema.  Respiratory:  CTA bilaterally, no w/r/r. Normal respiratory  effort. Abdomen:  soft, ntnd, NABS Skin: Profound right buttock, hip, and right lower back hematoma, no skin tears. Musculoskeletal: Left lower extremity externally rotated. Upper extremities with normal range of motion and tone. Symmetrical. Psychiatric:  grossly normal mood and affect, speech fluent and appropriate, AOx3 Neurologic:  CN 2-12 grossly intact, moves all extremities in coordinated fashion, sensation intact  Labs on Admission: I have personally reviewed following labs and imaging studies  CBC:  Recent Labs Lab 07/27/16 1541 07/28/16 0806 07/29/16 0604 07/30/16 0415 07/31/16 0555 08/01/16 0852 08/02/16 1255  WBC 14.9* 11.8* 11.2* 10.5 11.2* 10.3 17.0*  NEUTROABS 12.7* 9.5*  --   --   --   --  15.1*  HGB 6.4* 7.3* 7.6* 7.6* 7.5* 7.8* 7.7*  HCT 19.9* 22.4* 23.4* 23.3* 23.5* 24.4* 24.0*  MCV 92.6 91.4 91.1 92.8 93.6 93.1 93.0  PLT 181 131* 145* 152 168 195 0000000   Basic Metabolic Panel:  Recent Labs Lab 07/27/16 1541 07/28/16 0806 07/30/16 0415 08/02/16 1255  NA 135 138 136 136  K 5.9* 4.4 4.0 4.0  CL 103 107 103 101  CO2 25 25 25 26   GLUCOSE 141* 109* 125* 154*  BUN 52* 41* 32* 51*  CREATININE 1.41* 1.21 1.10 0.92  CALCIUM 8.4* 8.3* 8.0* 8.5*   GFR: Estimated Creatinine Clearance: 60.1 mL/min (by C-G formula based on SCr of 0.92 mg/dL). Liver Function Tests:  Recent Labs Lab 07/27/16 1541  AST 37  ALT 11*  ALKPHOS 54  BILITOT 0.9  PROT 5.0*  ALBUMIN 2.8*   No results for input(s): LIPASE, AMYLASE in the last 168 hours. No results for input(s): AMMONIA in the last 168 hours. Coagulation Profile:  Recent Labs Lab 07/27/16 1541 07/28/16 0806 07/29/16 0823 08/02/16 1255  INR 2.39 1.34 1.22 1.18   Cardiac Enzymes:  Recent Labs Lab 07/29/16 0823 07/29/16 1212  CKTOTAL 715* 657*   BNP (last 3 results) No results for input(s): PROBNP in the last 8760 hours. HbA1C: No results for input(s): HGBA1C in the last 72 hours. CBG:  Recent  Labs Lab 07/31/16 1219 07/31/16 1731 07/31/16 2208 08/01/16 0738 08/01/16 1228  GLUCAP 145* 150* 137* 165* 183*   Lipid Profile: No results for input(s): CHOL, HDL, LDLCALC, TRIG, CHOLHDL, LDLDIRECT in the last 72 hours. Thyroid Function Tests: No results for input(s): TSH, T4TOTAL, FREET4, T3FREE, THYROIDAB in the last 72 hours. Anemia Panel: No results for input(s): VITAMINB12, FOLATE, FERRITIN, TIBC, IRON, RETICCTPCT in the last 72 hours. Urine analysis: No results found for: COLORURINE, APPEARANCEUR, LABSPEC, PHURINE, GLUCOSEU, HGBUR, BILIRUBINUR, KETONESUR, PROTEINUR, UROBILINOGEN, NITRITE, LEUKOCYTESUR  Creatinine Clearance: Estimated Creatinine Clearance: 60.1 mL/min (by C-G formula based on SCr of 0.92 mg/dL).  Sepsis Labs: @LABRCNTIP (procalcitonin:4,lacticidven:4) )No results found for this or any previous visit (from the past 240 hour(s)).   Radiological Exams on Admission: Ct Head Wo Contrast  Result Date: 08/02/2016 CLINICAL DATA:  Head injury after fall. EXAM: CT HEAD WITHOUT CONTRAST TECHNIQUE: Contiguous axial images were obtained from the base of the skull through the vertex without intravenous contrast. COMPARISON:  CT scan of July 24, 2016. FINDINGS: Brain: Mild diffuse cortical atrophy is noted. Minimal chronic ischemic white matter disease is noted. No mass effect or midline shift is noted. Ventricular size is within normal limits. There is no evidence of mass lesion, hemorrhage or acute infarction. Vascular: Atherosclerosis of carotid siphons is noted. Skull: Normal. Negative for fracture or focal lesion. Sinuses/Orbits: No acute finding. Other: None. IMPRESSION: Mild diffuse cortical atrophy. Minimal chronic ischemic white matter disease. No acute intracranial abnormality seen. Electronically Signed   By: Marijo Conception, M.D.   On: 08/02/2016 13:43   Dg Hip Unilat W Or Wo Pelvis 2-3 Views Left  Result Date: 08/02/2016 CLINICAL DATA:  [place] today.  Injured left  hip. EXAM: DG HIP (WITH OR WITHOUT PELVIS) 2-3V LEFT COMPARISON:  CT pelvis 07/27/2016 FINDINGS: There is a displaced left femoral neck fracture with a varus deformity. The right hip is intact. The bony pelvis is intact. IMPRESSION: Displaced left femoral neck fracture. Electronically Signed   By: Marijo Sanes M.D.   On: 08/02/2016 12:25    EKG: Independently reviewed. Afib. No ACS  Assessment/Plan  Active Problems:   Chronic atrial fibrillation (HCC)   Essential hypertension, benign   Symptomatic anemia   Traumatic hematoma of buttock   Hip fracture (HCC)   BPH (benign prostatic hyperplasia)   Protein calorie malnutrition (HCC)   L hip fracture: sustained after mechanical fall. Plan is for total hip arthroplasty w/ anterior approach on 08/05/16 by Dr. Alvan Dame. Pt w/ approximately 1% preop cardiovascular risk based on Gupta scale. - preop workup initiated - NPO after midnight on 08/04/16 (order placed) - continue to hold coumadin (last dose 07/27/16) - 2 units PRBC per ortho request - Foley per ortho request.  - Pain mgt and bowel regimen ordered - Air overlay mattress - PT/OT after surgical repair. Given pts frequent falls due to deconditioning and now hip fracture, CIR may be appropriate for pt once able. - Echo for completeness in preperation for surgery  Anemia: 7.7 on admission. Stable from time of last discharge. Last admission Hgb was as low as 6.4 prior to transfusions. This wasdue to large Hematoma development in R hip secondary to p[revious mechanical fall and supratherapeutic INR. Baseline Hgb unclear. Normocytic.  - 2 units PRBC per ortho - Anemia panel - CBC in am  Afib: chronic. Rate controlled - continue Dilt, Dig - coumadin on hold for surgery - Echo as above  HTN: - continue Dilt, HCTZ,  lisinopril in am when pressure improves.   BPH: - continue flomax, Proscar  Acute gouty arthritis: stated on Prednisone during last admission for R knee swelling and pain. Improved  w/ steroids. 5 day course set to finish on 08/04/16 - continue allopurinol - Complete prednisone regimen.   Protein calorie malnutrition: - nutrition consult - Pre-albumin - feeding supplement   DVT prophylaxis: SCD  Code Status: full  Family Communication: wife and daughters  Disposition Plan: pending surgical correction of hip - anticipate CIR  Consults called: ortho  Admission status: inpt    Dail Meece J MD [facility]  If 7PM-7AM, please contact night-coverage [email or web address] Password [email or web address]  08/02/2016, 3:25 PM    ADDENDUM 08/22/16 ANEMIA FROM ACUTE BLOOD LOSS

## 2016-08-02 NOTE — ED Provider Notes (Signed)
Emergency Department Provider Note   I have reviewed the triage vital signs and the nursing notes.   HISTORY  Chief Complaint Fall and Hip Pain   HPI Isaac Villa is a 83 y.o. male with PMH of chronic a-fib (off warfarin since for last 3 days s/p fall), BPH, HTN, and HLD presents to the emergency department for evaluation of left hip pain after mechanical fall. The patient is from a rehabilitation facility after discharge for recent admission for fall. He states that he try to get to the bathroom and did not call for help. He slipped on the tile floor and fell to the ground. Her pain in the left hip after falling. States he may have struck his head but denies loss of consciousness. No preceding chest pain, palpitations, difficulty breathing. No recent fevers or chills. Patient is pain-free at this time when not moving the hip. Denies any numbness or tingling in the lower extremities.  Past Medical History:  Diagnosis Date  . Atrial fibrillation (HCC)    Chronic  . BPH (benign prostatic hypertrophy)   . Edema extremities 06/04/2014  . Elevated cholesterol   . Glaucoma   . Gout   . Hernia   . Herniated disc   . Hypertension   . Melanoma ([place])   . Melanoma ([place])   . Thrombocytopenia [facility])     Patient Active Problem List   Diagnosis Date Noted  . Hip fracture ([place]) 08/02/2016  . BPH (benign prostatic hyperplasia) 08/02/2016  . Protein calorie malnutrition ([place]) 08/02/2016  . Closed hip fracture, left, initial encounter ([place])   . Physical deconditioning   . Traumatic hematoma of buttock 08/01/2016  . Rhabdomyolysis 08/01/2016  . Symptomatic anemia 07/27/2016  . Generalized weakness 07/27/2016  . Hyperkalemia 07/27/2016  . Acute kidney injury ([place]) 07/27/2016  . Elevated INR 07/27/2016  . Hematoma 07/27/2016  . Atrial fibrillation ([place]) [I48.91] 02/08/2016  . Long term (current) use of anticoagulants [Z79.01] 02/08/2016  . Edema extremities 06/04/2014  . Chronic  anticoagulation 06/04/2013  . Thrombocytopenia ([place])   . Chronic atrial fibrillation ([place]) 05/30/2013  . Essential hypertension, benign 05/30/2013    Past Surgical History:  Procedure Laterality Date  . APPENDECTOMY    . CATARACT EXTRACTION Bilateral 04/2008  . EYE SURGERY     lens replacements  . HERNIA REPAIR    . JOINT REPLACEMENT     left knee  . [place] SURGERY  07/2004  . REPLACEMENT TOTAL KNEE Left 11/2006  . TONSILLECTOMY      Current Outpatient Rx  . Order #: [id number] Class: Historical Med  . Order #: [id number] Class: Historical Med  . Order #: [id number] Class: Historical Med  . Order #: [id number] Class: Historical Med  . Order #: [id number] Class: Historical Med  . Order #: [id number] Class: Normal  . Order #: [id number] Class: Historical Med  . Order #: [id number] Class: Historical Med  . Order #: [id number] Class: Print  . Order #: [id number] Class: Normal  . Order #: [id number] Class: Historical Med  . Order #: [id number] Class: Normal  . Order #: [id number] Class: Historical Med  . Order #: [id number] Class: Historical Med  . Order #: [id number] Class: Historical Med  . Order #: [id number] Class: Historical Med  . Order #: [id number] Class: Normal  . Order #: [id number] Class: Historical Med  . Order #: [id number] Class: Historical Med    Allergies Enoxaparin and Penicillins  Family History  Problem Relation Age of Onset  . Hypertension Mother   . Prostate cancer Father     Social History Social  History  Substance Use Topics  . Smoking status: Former Research scientist (life sciences)  . Smokeless tobacco: Never Used  . Alcohol use Yes     Comment: 3-4 glasses of wine per week    Review of Systems  Constitutional: No fever/chills Eyes: No visual changes. ENT: No sore throat. Cardiovascular: Denies chest pain. Respiratory: Denies shortness of breath. Gastrointestinal: No abdominal pain.  No nausea, no vomiting.  No diarrhea.  No constipation. Genitourinary: Negative for dysuria. Musculoskeletal: Negative  for back pain. Positive left leg pain.  Skin: Negative for rash. Neurological: Negative for headaches, focal weakness or numbness.  10-point ROS otherwise negative.  ____________________________________________   PHYSICAL EXAM:  VITAL SIGNS: ED Triage Vitals  Enc Vitals Group     BP 08/02/16 1154 (!) 108/47     Pulse Rate 08/02/16 1154 69     Resp 08/02/16 1154 18     Temp 08/02/16 1154 97.8 F (36.6 C)     Temp Source 08/02/16 1154 Oral     SpO2 08/02/16 1154 100 %     Pain Score 08/02/16 1154 10   Constitutional: Alert and oriented. Well appearing and in no acute distress. Eyes: Conjunctivae are normal. PERRL. Head: Atraumatic. Nose: No congestion/rhinnorhea. Mouth/Throat: Mucous membranes are moist.  Oropharynx non-erythematous. Neck: No stridor. No cervical spine tenderness to palpation. Cardiovascular: Normal rate, regular rhythm. Good peripheral circulation. Grossly normal heart sounds.   Respiratory: Normal respiratory effort.  No retractions. Lungs CTAB. Gastrointestinal: Soft and nontender. No distention.  Musculoskeletal: LLE was shortened and externally rotated. No gross deformities of extremities. Neurologic:  Normal speech and language. No gross focal neurologic deficits are appreciated.  Skin:  Skin is warm, dry and intact. No rash noted.   ____________________________________________   LABS (all labs ordered are listed, but only abnormal results are displayed)  Labs Reviewed  BASIC METABOLIC PANEL - Abnormal; Notable for the following:       Result Value   Glucose, Bld 154 (*)    BUN 51 (*)    Calcium 8.5 (*)    All other components within normal limits  CBC WITH DIFFERENTIAL/PLATELET - Abnormal; Notable for the following:    WBC 17.0 (*)    RBC 2.58 (*)    Hemoglobin 7.7 (*)    HCT 24.0 (*)    RDW 15.6 (*)    Neutro Abs 15.1 (*)    Lymphs Abs 0.5 (*)    Monocytes Absolute 1.3 (*)    All other components within normal limits  IRON AND TIBC -  Abnormal; Notable for the following:    TIBC 192 (*)    All other components within normal limits  FERRITIN - Abnormal; Notable for the following:    Ferritin 673 (*)    All other components within normal limits  RETICULOCYTES - Abnormal; Notable for the following:    Retic Ct Pct 5.2 (*)    RBC. 2.77 (*)    All other components within normal limits  PREALBUMIN - Abnormal; Notable for the following:    Prealbumin 9.8 (*)    All other components within normal limits  PROTIME-INR  VITAMIN B12  FOLATE  CBC  COMPREHENSIVE METABOLIC PANEL  APTT  PROTIME-INR  TYPE AND SCREEN  PREPARE RBC (CROSSMATCH)   ____________________________________________  EKG   EKG Interpretation  Date/Time:  Wednesday August 02 2016 14:16:52 EST Ventricular Rate:  65 PR Interval:    QRS Duration: 157 QT Interval:  457 QTC Calculation: 476 R Axis:   -126 Text  Interpretation:  Atrial fibrillation Nonspecific intraventricular conduction delay Consider anterior infarct Borderline ST depression, lateral leads No STEMI.  Confirmed by LONG MD, JOSHUA [phone]) on 08/02/2016 5:05:31 PM       ____________________________________________  RADIOLOGY  Ct Head Wo Contrast  Result Date: 08/02/2016 CLINICAL DATA:  Head injury after fall. EXAM: CT HEAD WITHOUT CONTRAST TECHNIQUE: Contiguous axial images were obtained from the base of the skull through the vertex without intravenous contrast. COMPARISON:  CT scan of July 24, 2016. FINDINGS: Brain: Mild diffuse cortical atrophy is noted. Minimal chronic ischemic white matter disease is noted. No mass effect or midline shift is noted. Ventricular size is within normal limits. There is no evidence of mass lesion, hemorrhage or acute infarction. Vascular: Atherosclerosis of carotid siphons is noted. Skull: Normal. Negative for fracture or focal lesion. Sinuses/Orbits: No acute finding. Other: None. IMPRESSION: Mild diffuse cortical atrophy. Minimal chronic ischemic  white matter disease. No acute intracranial abnormality seen. Electronically Signed   By: Marijo Conception, M.D.   On: 08/02/2016 13:43   Dg Hip Unilat W Or Wo Pelvis 2-3 Views Left  Result Date: 08/02/2016 CLINICAL DATA:  [place] today.  Injured left hip. EXAM: DG HIP (WITH OR WITHOUT PELVIS) 2-3V LEFT COMPARISON:  CT pelvis 07/27/2016 FINDINGS: There is a displaced left femoral neck fracture with a varus deformity. The right hip is intact. The bony pelvis is intact. IMPRESSION: Displaced left femoral neck fracture. Electronically Signed   By: Marijo Sanes M.D.   On: 08/02/2016 12:25    ____________________________________________   PROCEDURES  Procedure(s) performed:   Procedures  None ____________________________________________   INITIAL IMPRESSION / ASSESSMENT AND PLAN / ED COURSE  Pertinent labs & imaging results that were available during my care of the patient were reviewed by me and considered in my medical decision making (see chart for details).  Patient resents to the emergent department for evaluation of left hip pain after apparent mechanical fall. Patient has been seen by [facility], Dr. Wynelle Link, in the past for knee replacement. Patient has a left femoral neck fracture with normal pulses and sensation in the left lower extremity. The limb is shortened and externally rotated. Plan for orthopedic consultation. The patient has been off of his warfarin for the past 3 days after recent fall. No obvious head injury but with concern for head trauma will obtain CT head and pre-op labs.   Spoke with ortho who will be down to see the patient. Will admit to hospitalist.   Discussed patient's case with hospitlaist, Dr. Marily Memos.  Recommend admission to med-surg, inpatient bed.  I will place holding orders per their request. Patient and family (if present) updated with plan. Care transferred to hospitalist service.  I reviewed all nursing notes, vitals, pertinent old  records, EKGs, labs, imaging (as available).  ____________________________________________  FINAL CLINICAL IMPRESSION(S) / ED DIAGNOSES  Final diagnoses:  Closed fracture of neck of left femur, initial encounter ([place])     MEDICATIONS GIVEN DURING THIS VISIT:  Medications  lactose free nutrition (BOOST PLUS) liquid 237 mL (not administered)  magnesium oxide (MAG-OX) tablet 400 mg (not administered)  docusate sodium (COLACE) capsule 100 mg (not administered)  diltiazem (CARDIZEM CD) 24 hr capsule 240 mg (not administered)  omega-3 acid ethyl esters (LOVAZA) capsule 1 g (not administered)  potassium chloride SA (K-DUR,KLOR-CON) CR tablet 20 mEq (not administered)  lisinopril (PRINIVIL,ZESTRIL) tablet 10 mg (not administered)  digoxin (LANOXIN) tablet 0.125 mg (not administered)  allopurinol (ZYLOPRIM) tablet 300 mg (  not administered)  finasteride (PROSCAR) tablet 5 mg (not administered)  hydrochlorothiazide (HYDRODIURIL) tablet 25 mg (not administered)  latanoprost (XALATAN) 0.005 % ophthalmic solution 1 drop (not administered)  tamsulosin (FLOMAX) capsule 0.4 mg (not administered)  sodium chloride flush (NS) 0.9 % injection 3 mL (not administered)  sodium chloride flush (NS) 0.9 % injection 3 mL (not administered)  0.9 %  sodium chloride infusion (not administered)  acetaminophen (TYLENOL) tablet 650 mg (not administered)    Or  acetaminophen (TYLENOL) suppository 650 mg (not administered)  ondansetron (ZOFRAN) tablet 4 mg (not administered)    Or  ondansetron (ZOFRAN) injection 4 mg (not administered)  oxyCODONE (Oxy IR/ROXICODONE) immediate release tablet 5-10 mg (not administered)  methocarbamol (ROBAXIN) tablet 500 mg (not administered)    Or  methocarbamol (ROBAXIN) 500 mg in dextrose 5 % 50 mL IVPB (not administered)  traZODone (DESYREL) tablet 50 mg (not administered)  LORazepam (ATIVAN) injection 0.5 mg (not administered)  0.9 %  sodium chloride infusion (not  administered)  acetaminophen-codeine (TYLENOL #3) 300-30 MG per tablet 1-2 tablet (1 tablet Oral Given 08/02/16 1706)  fentaNYL (SUBLIMAZE) injection 12.5-25 mcg (not administered)  haloperidol lactate (HALDOL) injection 5 mg (not administered)     NEW OUTPATIENT MEDICATIONS STARTED DURING THIS VISIT:  None   Note:  This document was prepared using Dragon voice recognition software and may include unintentional dictation errors.  Nanda Quinton, MD Emergency Medicine   Margette Fast, MD 08/02/16 [phone]

## 2016-08-02 NOTE — ED Triage Notes (Signed)
Pt arrived by ptar for fall this am 0630. Pt is from [facility], was staying there for right hip fracture. Now has pain and shortening left leg today following fall.

## 2016-08-02 NOTE — Consult Note (Signed)
Reason for Consult:left hip pain Referring Physician: [facility] ED  Isaac Villa is an 83 y.o. male.  HPI: The patient is an 83 year old male with the chief complaint of left hip pain. He presented to the ED with his family from [facility]. The patient was DC from [facility] yesterday after an admission. He has a fall onto his right hip 10 days ago, resulting in a large hematoma and subsequent ABLA. He had been transfused and stabilized prior to transfer to [facility]. Coumadin has been held for the past 10 days. He went to the bathroom early this morning at [facility] and fell onto his left hip. He immediately reported pain and the inability to WB. He reports that he hit his head but did not lose consciousness. He denies headache but has had some issue with double vision, mostly since his last cataract surgery. He denies pain other than the left hip. He has a medical history significant for hypertension, BPH, A. Fib, and a previous left total knee arthroplasty by Dr. Gaynelle Arabian. His family was present in the room during consult and helped provide history. He is allergic to Lovenox as well as PCN. He last ate this morning around 8am. During his last admission, he was also followed for an elevated CPK level and his statin has been held.   Past Medical History:  Diagnosis Date  . Atrial fibrillation (HCC)    Chronic  . BPH (benign prostatic hypertrophy)   . Edema extremities 06/04/2014  . Elevated cholesterol   . Glaucoma   . Gout   . Hernia   . Herniated disc   . Hypertension   . Melanoma ([place])   . Melanoma ([place])   . Thrombocytopenia ([name])     Past Surgical History:  Procedure Laterality Date  . APPENDECTOMY    . CATARACT EXTRACTION Bilateral 04/2008  . EYE SURGERY     lens replacements  . HERNIA REPAIR    . JOINT REPLACEMENT     left knee  . [place] SURGERY  07/2004  . REPLACEMENT TOTAL KNEE Left 11/2006  . TONSILLECTOMY      Family History  Problem Relation Age of  Onset  . Hypertension Mother   . Prostate cancer Father     Social History:  reports that he has quit smoking. He has never used smokeless tobacco. He reports that he drinks alcohol. He reports that he does not use drugs.  Allergies:  Allergies  Allergen Reactions  . Enoxaparin Other (See Comments)    Reaction:  Internal bleeding  . Penicillins Other (See Comments)    Reaction:  Causes pt to pass out  Has patient had a PCN reaction causing immediate rash, facial/tongue/throat swelling, SOB or lightheadedness with hypotension: Yes Has patient had a PCN reaction causing severe rash involving mucus membranes or skin necrosis: No Has patient had a PCN reaction that required hospitalization Yes Has patient had a PCN reaction occurring within the last 10 years: No If all of the above answers are "NO", then may proceed with Cephalosporin use.    Medications: I have reviewed the patient's current medications.  Results for orders placed or performed during the hospital encounter of 08/02/16 (from the past 48 hour(s))  Basic metabolic panel     Status: Abnormal   Collection Time: 08/02/16 12:55 PM  Result Value Ref Range   Sodium 136 135 - 145 mmol/L   Potassium 4.0 3.5 - 5.1 mmol/L   Chloride 101  101 - 111 mmol/L   CO2 26 22 - 32 mmol/L   Glucose, Bld 154 (H) 65 - 99 mg/dL   BUN 51 (H) 6 - 20 mg/dL   Creatinine, Ser 0.92 0.61 - 1.24 mg/dL   Calcium 8.5 (L) 8.9 - 10.3 mg/dL   GFR calc non Af Amer >60 >60 mL/min   GFR calc Af Amer >60 >60 mL/min    Comment: (NOTE) The eGFR has been calculated using the CKD EPI equation. This calculation has not been validated in all clinical situations. eGFR's persistently <60 mL/min signify possible Chronic Kidney Disease.    Anion gap 9 5 - 15  CBC with Differential     Status: Abnormal   Collection Time: 08/02/16 12:55 PM  Result Value Ref Range   WBC 17.0 (H) 4.0 - 10.5 K/uL   RBC 2.58 (L) 4.22 - 5.81 MIL/uL   Hemoglobin 7.7 (L) 13.0 - 17.0  g/dL   HCT 24.0 (L) 39.0 - 52.0 %   MCV 93.0 78.0 - 100.0 fL   MCH 29.8 26.0 - 34.0 pg   MCHC 32.1 30.0 - 36.0 g/dL   RDW 15.6 (H) 11.5 - 15.5 %   Platelets 246 150 - 400 K/uL   Neutrophils Relative % 89 %   Neutro Abs 15.1 (H) 1.7 - 7.7 K/uL   Lymphocytes Relative 3 %   Lymphs Abs 0.5 (L) 0.7 - 4.0 K/uL   Monocytes Relative 8 %   Monocytes Absolute 1.3 (H) 0.1 - 1.0 K/uL   Eosinophils Relative 0 %   Eosinophils Absolute 0.0 0.0 - 0.7 K/uL   Basophils Relative 0 %   Basophils Absolute 0.0 0.0 - 0.1 K/uL  Protime-INR     Status: None   Collection Time: 08/02/16 12:55 PM  Result Value Ref Range   Prothrombin Time 15.0 11.4 - 15.2 seconds   INR 1.18     Ct Head Wo Contrast  Result Date: 08/02/2016 CLINICAL DATA:  Head injury after fall. EXAM: CT HEAD WITHOUT CONTRAST TECHNIQUE: Contiguous axial images were obtained from the base of the skull through the vertex without intravenous contrast. COMPARISON:  CT scan of July 24, 2016. FINDINGS: Brain: Mild diffuse cortical atrophy is noted. Minimal chronic ischemic white matter disease is noted. No mass effect or midline shift is noted. Ventricular size is within normal limits. There is no evidence of mass lesion, hemorrhage or acute infarction. Vascular: Atherosclerosis of carotid siphons is noted. Skull: Normal. Negative for fracture or focal lesion. Sinuses/Orbits: No acute finding. Other: None. IMPRESSION: Mild diffuse cortical atrophy. Minimal chronic ischemic white matter disease. No acute intracranial abnormality seen. Electronically Signed   By: Marijo Conception, M.D.   On: 08/02/2016 13:43   Dg Hip Unilat W Or Wo Pelvis 2-3 Views Left  Result Date: 08/02/2016 CLINICAL DATA:  [place] today.  Injured left hip. EXAM: DG HIP (WITH OR WITHOUT PELVIS) 2-3V LEFT COMPARISON:  CT pelvis 07/27/2016 FINDINGS: There is a displaced left femoral neck fracture with a varus deformity. The right hip is intact. The bony pelvis is intact. IMPRESSION:  Displaced left femoral neck fracture. Electronically Signed   By: Marijo Sanes M.D.   On: 08/02/2016 12:25    Review of Systems  Constitutional: Positive for malaise/fatigue. Negative for chills, diaphoresis, fever and weight loss.  HENT: Positive for hearing loss. Negative for congestion, ear discharge, ear pain, nosebleeds, sinus pain, sore throat and tinnitus.   Eyes: Positive for blurred vision and double vision. Negative for  photophobia, pain, discharge and redness.  Respiratory: Negative.  Negative for stridor.   Cardiovascular: Negative.   Gastrointestinal: Negative.   Genitourinary: Negative.   Musculoskeletal: Positive for falls, joint pain and myalgias. Negative for back pain and neck pain.  Skin: Negative.   Neurological: Positive for dizziness and weakness. Negative for tingling, tremors, sensory change, speech change, focal weakness, seizures, loss of consciousness and headaches.  Endo/Heme/Allergies: Negative for environmental allergies and polydipsia. Bruises/bleeds easily.  Psychiatric/Behavioral: Negative.    Blood pressure 105/58, pulse (!) 38, temperature 97.8 F (36.6 C), temperature source Oral, resp. rate 21, SpO2 99 %. Physical Exam  Constitutional: He is oriented to person, place, and time. He appears well-developed and well-nourished. No distress.  HENT:  Head: Normocephalic and atraumatic.  Right Ear: External ear normal.  Left Ear: External ear normal.  Nose: Nose normal.  Mouth/Throat: Oropharynx is clear and moist.  Eyes: Conjunctivae and EOM are normal.  Neck: Normal range of motion. Neck supple.  Cardiovascular: Normal rate, regular rhythm, normal heart sounds and intact distal pulses.   No murmur heard. Respiratory: Effort normal and breath sounds normal. No respiratory distress. He has no wheezes.  GI: Soft. Bowel sounds are normal. He exhibits no distension. There is no tenderness.  Musculoskeletal:       Right hip: He exhibits tenderness. He  exhibits normal range of motion.       Left hip: He exhibits decreased range of motion, decreased strength and tenderness.       Right knee: He exhibits decreased range of motion and swelling. He exhibits no effusion. Tenderness found. Medial joint line and lateral joint line tenderness noted.       Left knee: Normal.  Large hematoma extending over the lateral aspect of the right hip, proximal thigh, and flank. Incision over left total knee healed with no signs of infection. Shortening of the left LE consistent with femoral neck fracture.   Neurological: He is alert and oriented to person, place, and time. He has normal strength. No sensory deficit.  Skin: No rash noted. He is not diaphoretic.  Psychiatric: He has a normal mood and affect. His behavior is normal.    Assessment/Plan: Left displaced femoral neck fracture He needs surgical fixation for this issue. We need to get him stabilized from a medical standpoint before we proceed with surgery. Will transfuse with 2 units of PRBCs today and reassess tomorrow. Will have Dr. Alvan Dame perform total hip arthroplasty, anterior approach, likely Saturday. Will allow patient to eat until then. Will keep patient off Coumadin. Will place foley and SCDs. Strict bed rest. NWB left LE. Will follow along with medicine to determine safe surgical time.   [place], [place] 08/02/2016, 2:25 PM

## 2016-08-02 NOTE — ED Notes (Signed)
Returned to Rm 4 from xray. Family at bedside. Family states pt is at [facility] for rehab after falling and badly bruising right hip. Pt fell today and has noted left hip pain, shortening, and rotation.

## 2016-08-02 NOTE — ED Notes (Signed)
Ortho in for pt eval.

## 2016-08-02 NOTE — ED Notes (Signed)
Consent for blood signed by pt 

## 2016-08-03 ENCOUNTER — Inpatient Hospital Stay (HOSPITAL_COMMUNITY): Payer: Medicare Other

## 2016-08-03 DIAGNOSIS — I4891 Unspecified atrial fibrillation: Secondary | ICD-10-CM

## 2016-08-03 DIAGNOSIS — E43 Unspecified severe protein-calorie malnutrition: Secondary | ICD-10-CM

## 2016-08-03 LAB — BASIC METABOLIC PANEL
Anion gap: 6 (ref 5–15)
BUN: 30 mg/dL — ABNORMAL HIGH (ref 6–20)
CHLORIDE: 108 mmol/L (ref 101–111)
CO2: 25 mmol/L (ref 22–32)
Calcium: 8.1 mg/dL — ABNORMAL LOW (ref 8.9–10.3)
Creatinine, Ser: 0.82 mg/dL (ref 0.61–1.24)
GFR calc non Af Amer: >60 mL/min (ref >60)
Glucose, Bld: 103 mg/dL — ABNORMAL HIGH (ref 65–99)
Potassium: 4.2 mmol/L (ref 3.5–5.1)
SODIUM: 139 mmol/L (ref 135–145)

## 2016-08-03 LAB — ECHOCARDIOGRAM COMPLETE
CHL CUP DOP CALC LVOT VTI: 11.6 cm
CHL CUP MV DEC (S): 268
CHL CUP TV REG PEAK VELOCITY: 246 cm/s
EWDT: 268 ms
FS: 40 % (ref 28–44)
IV/PV OW: 1.2
LA ID, A-P, ES: 42 mm
LA diam end sys: 42 mm
LA diam index: 2.09 cm/m2
LA vol A4C: 146 ml
LVELAT: 13.2 cm/s
LVOT SV: 57 mL
LVOT area: 4.91 cm2
LVOT peak grad rest: 2 mmHg
LVOTD: 25 mm
LVOTPV: 68.7 cm/s
PV Reg grad dias: 12 mmHg
PV Reg vel dias: 171 cm/s
PW: 10 mm — AB (ref 0.6–1.1)
RV TAPSE: 26.4 mm
RV sys press: 32 mmHg
TDI e' lateral: 13.2
TDI e' medial: 6.74
TR max vel: 246 cm/s

## 2016-08-03 LAB — CBC
HEMATOCRIT: 28.5 % — AB (ref 39.0–52.0)
Hemoglobin: 9.1 g/dL — ABNORMAL LOW (ref 13.0–17.0)
MCH: 28.9 pg (ref 26.0–34.0)
MCHC: 31.9 g/dL (ref 30.0–36.0)
MCV: 90.5 fL (ref 78.0–100.0)
Platelets: 212 10*3/uL (ref 150–400)
RBC: 3.15 MIL/uL — ABNORMAL LOW (ref 4.22–5.81)
RDW: 16.7 % — AB (ref 11.5–15.5)
WBC: 10.3 10*3/uL (ref 4.0–10.5)

## 2016-08-03 LAB — COMPREHENSIVE METABOLIC PANEL
ALT: 18 U/L (ref 17–63)
ANION GAP: 9 (ref 5–15)
AST: 28 U/L (ref 15–41)
Albumin: 1.9 g/dL — ABNORMAL LOW (ref 3.5–5.0)
Alkaline Phosphatase: 44 U/L (ref 38–126)
BUN: 37 mg/dL — AB (ref 6–20)
CO2: 25 mmol/L (ref 22–32)
Calcium: 7.3 mg/dL — ABNORMAL LOW (ref 8.9–10.3)
Chloride: 108 mmol/L (ref 101–111)
Creatinine, Ser: 0.77 mg/dL (ref 0.61–1.24)
Glucose, Bld: 93 mg/dL (ref 65–99)
Potassium: 3.6 mmol/L (ref 3.5–5.1)
Sodium: 142 mmol/L (ref 135–145)
TOTAL PROTEIN: 4.2 g/dL — AB (ref 6.5–8.1)
Total Bilirubin: 1.8 mg/dL — ABNORMAL HIGH (ref 0.3–1.2)

## 2016-08-03 LAB — PROTIME-INR
INR: 1.28
PROTHROMBIN TIME: 16.1 s — AB (ref 11.4–15.2)

## 2016-08-03 LAB — MAGNESIUM: MAGNESIUM: 2 mg/dL (ref 1.7–2.4)

## 2016-08-03 LAB — SURGICAL PCR SCREEN
MRSA, PCR: NEGATIVE
Staphylococcus aureus: NEGATIVE

## 2016-08-03 LAB — APTT: APTT: 32 s (ref 24–36)

## 2016-08-03 LAB — PHOSPHORUS: PHOSPHORUS: 2.4 mg/dL — AB (ref 2.5–4.6)

## 2016-08-03 MED ORDER — PERFLUTREN LIPID MICROSPHERE
1.0000 mL | INTRAVENOUS | Status: AC | PRN
  Administered 2016-08-03: 4 mL via INTRAVENOUS
  Filled 2016-08-03: qty 10

## 2016-08-03 MED ORDER — POTASSIUM CHLORIDE CRYS ER 20 MEQ PO TBCR
40.0000 meq | EXTENDED_RELEASE_TABLET | Freq: Once | ORAL | Status: AC
  Administered 2016-08-03: 40 meq via ORAL
  Filled 2016-08-03: qty 2

## 2016-08-03 MED ORDER — HYDROXYZINE HCL 10 MG PO TABS
10.0000 mg | ORAL_TABLET | Freq: Three times a day (TID) | ORAL | Status: DC | PRN
  Administered 2016-08-04: 10 mg via ORAL
  Filled 2016-08-03 (×4): qty 1

## 2016-08-03 MED ORDER — VANCOMYCIN HCL IN DEXTROSE 1-5 GM/200ML-% IV SOLN: 1000.0000 mg | INTRAVENOUS | Status: DC

## 2016-08-03 NOTE — Progress Notes (Signed)
Sent message to Dr. Alfredia Ferguson letting him know telemetry called and stated patient had 2.25 sec pause.

## 2016-08-03 NOTE — Progress Notes (Signed)
  Echocardiogram 2D Echocardiogram has been performed with definity.  Aggie Cosier 08/03/2016, 12:13 PM

## 2016-08-03 NOTE — Progress Notes (Signed)
PROGRESS NOTE    [name]  [id number] DOB: [birth year] DOA: 08/02/2016 PCP: [name], MD   Brief Narrative:  Isaac Villa is a 83 y.o. male with medical history significant of atrophic fibrillation, BPH, HLD, glaucoma, gout, HTN, melanoma. Of note patient may recently admitted for approximately 5 day stay due to symptomatic anemia secondary to right thigh hematoma which was sustained after a mechanical fall on anticoagulation with supratherapeutic INR. Patient had been discharged from the hospital for 24 hours prior to readmission. Patient reports ambulating across the tile floor in an effort to use the bathroom when his feet slipped from underneath him. Patient fell to the ground striking his left hip. Unsure if he struck his head but denies any specific focal pain complaints outside of his left hip, chest pain, palpitations, nausea, vomiting, shortness of breath, fevers, abdominal pain, dysuria, frequency, back pain. Pain in left hip is constant. Worse with movement. Admitted for Left Hip Fracture and transfused 2 units of pRBC's. Doing well this AM and Orthopedics planning on Surgery on Saturday.   Assessment & Plan:   Active Problems:   Chronic atrial fibrillation (HCC)   Essential hypertension, benign   Symptomatic anemia   Traumatic hematoma of buttock   Hip fracture (HCC)   BPH (benign prostatic hyperplasia)   Protein calorie malnutrition (HCC)  Acute Left Hip fracture:  -sustained after mechanical fall. Plan is for total hip arthroplasty w/ anterior approach on 08/05/16 by Dr. Alvan Dame. Pt w/ approximately 1% preop cardiovascular risk based on Gupta scale. - preop workup initiated - NPO after midnight on 08/04/16 (order placed) - continue to hold coumadin (last dose 07/27/16) - S/p 2 units PRBC per ortho request - Foley per ortho request.  - Pain mgt adjusted - Air overlay mattress - PT/OT after surgical repair. Given pts frequent falls due to deconditioning and now hip  fracture, CIR may be appropriate for pt once able. - Echo showed Definity used; normal LV function; mild AI; mildly dilated aortic   root (4.5 cm); suggest CTA or MRA to further assess; biatrial enlargement; mild RVE; mild TR; small pericardial effusion.  Hospital Delirium -Avoid Sedating Medications -Bed Hygiene -If worsens will consider scanning head  Anemia:  -7.7 on admission. Now improved to 9.1  -This was due to large Hematoma development in R hip secondary to p[revious mechanical fall and supratherapeutic INR. Baseline Hgb unclear. Normocytic.  - s/p 2 units PRBC per ortho; May consider giving another 2 doses - Anemia panel - CBC in am  Afib: chronic. Rate controlled - continue Diltiazem, Digoxin - Coumadin on hold for surgery - Echo as above  HTN: - continue Diltiazem, HCTZ,  lisinopril in am when pressure improves.   BPH: - Continue flomax, Proscar  Acute gouty arthritis:  -stated on Prednisone during last admission for R knee swelling and pain. Improved w/ steroids. 5 day course set to finish on 08/04/16 - continue allopurinol - Complete prednisone regimen.   Protein calorie malnutrition: - nutrition consult - Pre-albumin - feeding supplement  Sinus Pause -Will check Electrolytes and repletes -Continue to Monitor closely; will hold Digoxin and Diltiazem  Mildly Dilated Aortic Root -Will discuss with Cardiology and possibly obtain CTA  DVT prophylaxis: SCDs Code Status: FULL CODE Family Communication: Discussed with Daughter at bedside and other daughter over the phone. Disposition Plan: Likely SNF or CIR pending PT evaluation.  Consultants:   Orthopedic Surgery  Procedures: ECHOCARDIOGRAM Study Conclusions  - Left ventricle: The cavity size was normal.  There was mild focal   basal hypertrophy of the septum. Systolic function was normal.   The estimated ejection fraction was in the range of 50% to 55%.   Wall motion was normal; there were no  regional wall motion   abnormalities. - Ventricular septum: Septal motion showed abnormal function and   dyssynergy. - Aortic valve: There was mild regurgitation. - Aortic root: The aortic root was mildly dilated. - Mitral valve: Calcified annulus. - Left atrium: The atrium was severely dilated. - Right ventricle: The cavity size was mildly dilated. - Right atrium: The atrium was severely dilated. - Pulmonary arteries: PA peak pressure: 32 mm Hg (S). - Pericardium, extracardiac: A small pericardial effusion was   identified.  Impressions:  - Definity used; normal LV function; mild AI; mildly dilated aortic   root (4.5 cm); suggest CTA or MRA to further assess; biatrial   enlargement; mild RVE; mild TR; small pericardial effusion   Antimicrobials:  Anti-infectives    Start     Dose/Rate Route Frequency Ordered Stop   08/05/16 1000  vancomycin (VANCOCIN) IVPB 1000 mg/200 mL premix     1,000 mg 200 mL/hr over 60 Minutes Intravenous On call to O.R. 08/03/16 [id number] 08/06/16 0559   08/03/16 0600  vancomycin (VANCOCIN) IVPB 1000 mg/200 mL premix  Status:  Discontinued     1,000 mg 200 mL/hr over 60 Minutes Intravenous On call to O.R. 08/02/16 1843 08/03/16 [id number]     Subjective: Seen at bedside and was doing well. No complaints. Per Daughter he has periods of confusion and sees things that are not there. Once he is redirected he is fine. Will hold sedating meds. No Cp. And Right Leg Hematoma slightly painful.   Objective: Vitals:   08/03/16 0050 08/03/16 0251 08/03/16 0400 08/03/16 0807  BP: 117/60 136/73 (!) 148/62 126/60  Pulse: 72 74 81 70  Resp: 16 18 16    Temp: 98.5 F (36.9 C) 98.3 F (36.8 C) 97.6 F (36.4 C)   TempSrc: Oral Oral Oral   SpO2: 97% 97% 94% 98%    Intake/Output Summary (Last 24 hours) at 08/03/16 0809 Last data filed at 08/03/16 [id number]  Gross per 24 hour  Intake           1227.5 ml  Output              575 ml  Net            652.5 ml   There were no  vitals filed for this visit.  Examination: Physical Exam:  Constitutional:  NAD and appears calm and comfortable Eyes: Lids and conjunctivae normal, sclerae anicteric  ENMT: External Ears, Nose appear normal. Grossly normal hearing.  Neck: Appears normal, supple, no cervical masses, normal ROM, no appreciable thyromegaly Respiratory: Clear to auscultation bilaterally, no wheezing, rales, rhonchi or crackles. Normal respiratory effort and patient is not tachypenic. No accessory muscle use.  Cardiovascular: RRR, no murmurs / rubs / gallops. S1 and S2 auscultated.  Abdomen: Soft, non-tender, non-distended. No masses palpated. No appreciable hepatosplenomegaly. Bowel sounds positive.  GU: Deferred. Musculoskeletal: No clubbing / cyanosis of digits/nails. Left Leg externally rotated.  Skin: Large ecchymosis/hematoma on right lower leg and buttocks. Neurologic: CN 2-12 grossly intact with no focal deficits. Romberg sign cerebellar reflexes not assessed.  Psychiatric: Normal judgment and insight. Alert and oriented x 3. Normal mood and appropriate affect.   Data Reviewed: I have personally reviewed following labs and imaging studies  CBC:  Recent Labs Lab  07/27/16 1541 07/28/16 0806  07/30/16 0415 07/31/16 0555 08/01/16 0852 08/02/16 1255 08/03/16 0510  WBC 14.9* 11.8*  < > 10.5 11.2* 10.3 17.0* 10.3  NEUTROABS 12.7* 9.5*  --   --   --   --  15.1*  --   HGB 6.4* 7.3*  < > 7.6* 7.5* 7.8* 7.7* 9.1*  HCT 19.9* 22.4*  < > 23.3* 23.5* 24.4* 24.0* 28.5*  MCV 92.6 91.4  < > 92.8 93.6 93.1 93.0 90.5  PLT 181 131*  < > 152 168 195 246 212  < > = values in this interval not displayed. Basic Metabolic Panel:  Recent Labs Lab 07/27/16 1541 07/28/16 0806 07/30/16 0415 08/02/16 1255 08/03/16 0510  NA 135 138 136 136 142  K 5.9* 4.4 4.0 4.0 3.6  CL 103 107 103 101 108  CO2 25 25 25 26 25   GLUCOSE 141* 109* 125* 154* 93  BUN 52* 41* 32* 51* 37*  CREATININE 1.41* 1.21 1.10 0.92 0.77    CALCIUM 8.4* 8.3* 8.0* 8.5* 7.3*   GFR: Estimated Creatinine Clearance: 69.1 mL/min (by C-G formula based on SCr of 0.77 mg/dL). Liver Function Tests:  Recent Labs Lab 07/27/16 1541 08/03/16 0510  AST 37 28  ALT 11* 18  ALKPHOS 54 44  BILITOT 0.9 1.8*  PROT 5.0* 4.2*  ALBUMIN 2.8* 1.9*   No results for input(s): LIPASE, AMYLASE in the last 168 hours. No results for input(s): AMMONIA in the last 168 hours. Coagulation Profile:  Recent Labs Lab 07/27/16 1541 07/28/16 0806 07/29/16 0823 08/02/16 1255 08/03/16 0510  INR 2.39 1.34 1.22 1.18 1.28   Cardiac Enzymes:  Recent Labs Lab 07/29/16 0823 07/29/16 1212  CKTOTAL 715* 657*   BNP (last 3 results) No results for input(s): PROBNP in the last 8760 hours. HbA1C: No results for input(s): HGBA1C in the last 72 hours. CBG:  Recent Labs Lab 07/31/16 1219 07/31/16 1731 07/31/16 2208 08/01/16 0738 08/01/16 1228  GLUCAP 145* 150* 137* 165* 183*   Lipid Profile: No results for input(s): CHOL, HDL, LDLCALC, TRIG, CHOLHDL, LDLDIRECT in the last 72 hours. Thyroid Function Tests: No results for input(s): TSH, T4TOTAL, FREET4, T3FREE, THYROIDAB in the last 72 hours. Anemia Panel:  Recent Labs  08/02/16 1528  VITAMINB12 884  FOLATE 20.9  FERRITIN 673*  TIBC 192*  IRON 47  RETICCTPCT 5.2*   Sepsis Labs: No results for input(s): PROCALCITON, LATICACIDVEN in the last 168 hours.  Recent Results (from the past 240 hour(s))  Surgical PCR screen     Status: None   Collection Time: 08/03/16  4:00 AM  Result Value Ref Range Status   MRSA, PCR NEGATIVE NEGATIVE Final   Staphylococcus aureus NEGATIVE NEGATIVE Final    Comment:        The Xpert SA Assay (FDA approved for NASAL specimens in patients over 41 years of age), is one component of a comprehensive surveillance program.  Test performance has been validated by [facility] for patients greater than or equal to 57 year old. It is not intended to diagnose  infection nor to guide or monitor treatment.     Radiology Studies: Ct Head Wo Contrast  Result Date: 08/02/2016 CLINICAL DATA:  Head injury after fall. EXAM: CT HEAD WITHOUT CONTRAST TECHNIQUE: Contiguous axial images were obtained from the base of the skull through the vertex without intravenous contrast. COMPARISON:  CT scan of July 24, 2016. FINDINGS: Brain: Mild diffuse cortical atrophy is noted. Minimal chronic ischemic white matter disease  is noted. No mass effect or midline shift is noted. Ventricular size is within normal limits. There is no evidence of mass lesion, hemorrhage or acute infarction. Vascular: Atherosclerosis of carotid siphons is noted. Skull: Normal. Negative for fracture or focal lesion. Sinuses/Orbits: No acute finding. Other: None. IMPRESSION: Mild diffuse cortical atrophy. Minimal chronic ischemic white matter disease. No acute intracranial abnormality seen. Electronically Signed   By: Marijo Conception, M.D.   On: 08/02/2016 13:43   Dg Hip Unilat W Or Wo Pelvis 2-3 Views Left  Result Date: 08/02/2016 CLINICAL DATA:  [place] today.  Injured left hip. EXAM: DG HIP (WITH OR WITHOUT PELVIS) 2-3V LEFT COMPARISON:  CT pelvis 07/27/2016 FINDINGS: There is a displaced left femoral neck fracture with a varus deformity. The right hip is intact. The bony pelvis is intact. IMPRESSION: Displaced left femoral neck fracture. Electronically Signed   By: Marijo Sanes M.D.   On: 08/02/2016 12:25   Scheduled Meds: . sodium chloride   Intravenous Once  . allopurinol  300 mg Oral Q1200  . chlorhexidine  60 mL Topical Once  . digoxin  0.125 mg Oral Daily  . diltiazem  240 mg Oral Q1200  . docusate sodium  100 mg Oral BID  . finasteride  5 mg Oral QHS  . hydrochlorothiazide  25 mg Oral Daily  . lactose free nutrition  237 mL Oral TID BM  . latanoprost  1 drop Both Eyes QHS  . lisinopril  10 mg Oral Daily  . magnesium oxide  400 mg Oral Q1200  . omega-3 acid ethyl esters  1 g Oral  Q1200  . potassium chloride SA  20 mEq Oral BID  . povidone-iodine  2 application Topical Once  . sodium chloride flush  3 mL Intravenous Q12H  . tamsulosin  0.4 mg Oral Daily  . traZODone  50 mg Oral QHS  . [START ON 08/05/2016] vancomycin  1,000 mg Intravenous On Call to OR   Continuous Infusions: . sodium chloride 100 mL/hr at 08/02/16 2121  . lactated ringers       LOS: 1 day   Kerney Elbe, DO [facility] Pager [phone]  If 7PM-7AM, please contact night-coverage [email or web address] Password [email or web address] 08/03/2016, 8:09 AM

## 2016-08-03 NOTE — Progress Notes (Signed)
Initial Nutrition Assessment  DOCUMENTATION CODES:   Severe malnutrition in context of chronic illness  INTERVENTION:  Continue Boost Plus po TID, each supplement provides 360 kcal and 14 grams of protein.   Encourage adequate PO intake.   Recommend obtaining new weight to fully assess weight trends.  NUTRITION DIAGNOSIS:   Malnutrition related to chronic illness as evidenced by severe depletion of body fat, severe depletion of muscle mass.  GOAL:   Patient will meet greater than or equal to 90% of their needs  MONITOR:   PO intake, Supplement acceptance, Labs, Weight trends, Skin, I & O's  REASON FOR ASSESSMENT:   Consult Assessment of nutrition requirement/status  ASSESSMENT:   83 y.o. male with medical history significant of atrophic fibrillation, BPH, HLD, glaucoma, gout, HTN, melanoma. Of note patient may recently admitted for approximately 5 day stay due to symptomatic anemia secondary to right thigh hematoma which was sustained after a mechanical fall  Plans for surgery Saturday.  Wife at bedside reports pt has only consumed a couple of bites at meals. Noted wife has brought food from home to encourage pt to eat. She reports pt usually consumes at least 3 meals a day with Boost shakes at least 1-2 times daily. Noted no recent weight recorded. Recommend obtaining new weight to fully assess weight trends. Pt currently has Boost Plus ordered TID. RD to continue with current orders to aid in adequate nutrition.   Nutrition-Focused physical exam completed. Findings are severe fat depletion, severe muscle depletion, and no edema.   Labs and medications reviewed.   Diet Order:  Diet NPO time specified Except for: Sips with Meds Diet NPO time specified Diet regular Room service appropriate? Yes; Fluid consistency: Thin  Skin:  Reviewed, no issues  Last BM:  1/31  Height:   Ht Readings from Last 1 Encounters:  07/27/16 6\' 2"  (1.88 m)    Weight:   Wt Readings from  Last 1 Encounters:  07/27/16 172 lb (78 kg)    Ideal Body Weight:  86.36 kg  BMI:  There is no height or weight on file to calculate BMI.  Estimated Nutritional Needs:   Kcal:  1700-1900  Protein:  80-90 grams  Fluid:  1.7 - 1.9 L/day  EDUCATION NEEDS:   No education needs identified at this time  Corrin Parker, MS, RD, LDN Pager # [phone] After hours/ weekend pager # [phone]

## 2016-08-03 NOTE — Progress Notes (Signed)
     Patient reports pain as 3 on 0-10 scale.  Doing very well this morning. Hematoma on opposite hip. His Fracture is in his LEFT hip. Good function in Left foot. He had 2-units of blood and his Hbg is now 9.1. I would give him 2-more units in order to prepare him for surgery Saturday. I will let the Hospitalist determine this.  Objective: Vital signs in last 24 hours: Temp:  [97.3 F (36.3 C)-98.5 F (36.9 C)] 97.6 F (36.4 C) (02/01 0400) Pulse Rate:  [38-81] 81 (02/01 0400) Resp:  [14-26] 16 (02/01 0400) BP: (104-148)/(47-73) 148/62 (02/01 0400) SpO2:  [94 %-100 %] 94 % (02/01 0400)  Intake/Output from previous day: 01/31 0701 - 02/01 0700 In: 1227.5 [I.V.:565; Blood:662.5] Out: 575 [Urine:575] Intake/Output this shift: No intake/output data recorded.   Recent Labs  08/01/16 0852 08/02/16 1255 08/03/16 0510  HGB 7.8* 7.7* 9.1*    Recent Labs  08/02/16 1255 08/02/16 1528 08/03/16 0510  WBC 17.0*  --  10.3  RBC 2.58* 2.77* 3.15*  HCT 24.0*  --  28.5*  PLT 246  --  212    Recent Labs  08/02/16 1255 08/03/16 0510  NA 136 142  K 4.0 3.6  CL 101 108  CO2 26 25  BUN 51* 37*  CREATININE 0.92 0.77  GLUCOSE 154* 93  CALCIUM 8.5* 7.3*    Recent Labs  08/02/16 1255 08/03/16 0510  INR 1.18 1.28    Neurovascular intact Dorsiflexion/Plantar flexion intact  Assessment/Plan:    Continue foley due Hip Fracturepatient critically ill and Hip Fracture  Amerah Puleo A 08/03/2016, 7:23 AM

## 2016-08-03 NOTE — Plan of Care (Signed)
Problem: Education: Goal: Knowledge of [facility] Education information/materials will improve Outcome: Not Met (add Reason) Pt with intermittent confusion.  Unable to teach back.

## 2016-08-03 NOTE — Progress Notes (Signed)
Pt refused foley and refused condom catheter.  Educated on indication of foley and condom cath.

## 2016-08-04 ENCOUNTER — Encounter (HOSPITAL_COMMUNITY): Payer: Self-pay | Admitting: General Practice

## 2016-08-04 DIAGNOSIS — D649 Anemia, unspecified: Secondary | ICD-10-CM

## 2016-08-04 DIAGNOSIS — R410 Disorientation, unspecified: Secondary | ICD-10-CM

## 2016-08-04 DIAGNOSIS — I77810 Thoracic aortic ectasia: Secondary | ICD-10-CM

## 2016-08-04 LAB — TYPE AND SCREEN
Blood Product Expiration Date: 201802272359
Blood Product Expiration Date: 201802272359
ISSUE DATE / TIME: 201802010026
ISSUE DATE / TIME: 201801312142
Unit Type and Rh: 8400
Unit Type and Rh: 8400

## 2016-08-04 LAB — CBC WITH DIFFERENTIAL/PLATELET
Basophils Absolute: 0 10*3/uL (ref 0.0–0.1)
Basophils Relative: 0 %
EOS ABS: 0.1 10*3/uL (ref 0.0–0.7)
EOS PCT: 1 %
HCT: 32.9 % — ABNORMAL LOW (ref 39.0–52.0)
Hemoglobin: 10.3 g/dL — ABNORMAL LOW (ref 13.0–17.0)
LYMPHS ABS: 0.6 10*3/uL — AB (ref 0.7–4.0)
LYMPHS PCT: 5 %
MCH: 28.9 pg (ref 26.0–34.0)
MCHC: 31.3 g/dL (ref 30.0–36.0)
MCV: 92.4 fL (ref 78.0–100.0)
MONO ABS: 1 10*3/uL (ref 0.1–1.0)
MONOS PCT: 8 %
Neutro Abs: 10.4 10*3/uL — ABNORMAL HIGH (ref 1.7–7.7)
Neutrophils Relative %: 86 %
PLATELETS: 228 10*3/uL (ref 150–400)
RBC: 3.56 MIL/uL — AB (ref 4.22–5.81)
RDW: 16.6 % — AB (ref 11.5–15.5)
WBC: 12.2 10*3/uL — ABNORMAL HIGH (ref 4.0–10.5)

## 2016-08-04 LAB — COMPREHENSIVE METABOLIC PANEL
ALT: 17 U/L (ref 17–63)
AST: 23 U/L (ref 15–41)
Albumin: 2.3 g/dL — ABNORMAL LOW (ref 3.5–5.0)
Alkaline Phosphatase: 58 U/L (ref 38–126)
Anion gap: 7 (ref 5–15)
BUN: 24 mg/dL — ABNORMAL HIGH (ref 6–20)
CHLORIDE: 108 mmol/L (ref 101–111)
CO2: 24 mmol/L (ref 22–32)
CREATININE: 0.88 mg/dL (ref 0.61–1.24)
Calcium: 8.1 mg/dL — ABNORMAL LOW (ref 8.9–10.3)
Glucose, Bld: 101 mg/dL — ABNORMAL HIGH (ref 65–99)
Potassium: 4.3 mmol/L (ref 3.5–5.1)
SODIUM: 139 mmol/L (ref 135–145)
Total Bilirubin: 1.8 mg/dL — ABNORMAL HIGH (ref 0.3–1.2)
Total Protein: 4.9 g/dL — ABNORMAL LOW (ref 6.5–8.1)

## 2016-08-04 LAB — URINALYSIS, ROUTINE W REFLEX MICROSCOPIC
BILIRUBIN URINE: NEGATIVE
Glucose, UA: NEGATIVE mg/dL
Hgb urine dipstick: NEGATIVE
KETONES UR: NEGATIVE mg/dL
Leukocytes, UA: NEGATIVE
NITRITE: NEGATIVE
PH: 6 (ref 5.0–8.0)
PROTEIN: NEGATIVE mg/dL
Specific Gravity, Urine: 1.016 (ref 1.005–1.030)

## 2016-08-04 LAB — DIGOXIN LEVEL: DIGOXIN LVL: 0.7 ng/mL — AB (ref 0.8–2.0)

## 2016-08-04 LAB — MAGNESIUM: Magnesium: 2 mg/dL (ref 1.7–2.4)

## 2016-08-04 LAB — PHOSPHORUS: PHOSPHORUS: 2.2 mg/dL — AB (ref 2.5–4.6)

## 2016-08-04 LAB — CALCIUM, IONIZED: Calcium, Ionized, Serum: 5 mg/dL (ref 4.5–5.6)

## 2016-08-04 MED ORDER — VANCOMYCIN HCL IN DEXTROSE 1-5 GM/200ML-% IV SOLN
1000.0000 mg | INTRAVENOUS | Status: AC
  Administered 2016-08-05: 1000 mg via INTRAVENOUS
  Filled 2016-08-04: qty 200

## 2016-08-04 MED ORDER — CHLORHEXIDINE GLUCONATE 4 % EX LIQD
60.0000 mL | Freq: Once | CUTANEOUS | Status: AC
  Administered 2016-08-04: 4 via TOPICAL
  Filled 2016-08-04: qty 60

## 2016-08-04 MED ORDER — GENTAMICIN SULFATE 40 MG/ML IJ SOLN
1.5000 mg/kg | INTRAVENOUS | Status: AC
  Administered 2016-08-05: 120 mg via INTRAVENOUS
  Filled 2016-08-04 (×2): qty 3

## 2016-08-04 MED ORDER — K PHOS MONO-SOD PHOS DI & MONO 155-852-130 MG PO TABS
500.0000 mg | ORAL_TABLET | Freq: Three times a day (TID) | ORAL | Status: DC
  Administered 2016-08-04 – 2016-08-08 (×8): 500 mg via ORAL
  Filled 2016-08-04 (×15): qty 2

## 2016-08-04 MED ORDER — SODIUM CHLORIDE 0.9 % IV SOLN
INTRAVENOUS | Status: DC
  Administered 2016-08-04: 22:00:00 via INTRAVENOUS

## 2016-08-04 MED ORDER — ACETAMINOPHEN-CODEINE #3 300-30 MG PO TABS
1.0000 | ORAL_TABLET | Freq: Four times a day (QID) | ORAL | Status: DC
  Administered 2016-08-04 (×2): 1 via ORAL
  Filled 2016-08-04 (×2): qty 1

## 2016-08-04 MED ORDER — SODIUM CHLORIDE 0.9 % IV SOLN: 1500.0000 mg | INTRAVENOUS | Status: DC

## 2016-08-04 MED ORDER — ACETAMINOPHEN 650 MG RE SUPP
650.0000 mg | Freq: Three times a day (TID) | RECTAL | Status: DC
  Filled 2016-08-04: qty 1

## 2016-08-04 MED ORDER — POVIDONE-IODINE 10 % EX SWAB
2.0000 | Freq: Once | CUTANEOUS | Status: DC
Start: 2016-08-04 — End: 2016-08-05

## 2016-08-04 MED ORDER — ACETAMINOPHEN-CODEINE #3 300-30 MG PO TABS: 1.0000 | ORAL_TABLET | Freq: Four times a day (QID) | ORAL | Status: DC | PRN

## 2016-08-04 MED ORDER — ACETAMINOPHEN 500 MG PO TABS
1000.0000 mg | ORAL_TABLET | Freq: Three times a day (TID) | ORAL | Status: DC
  Administered 2016-08-04 – 2016-08-08 (×7): 1000 mg via ORAL
  Filled 2016-08-04 (×9): qty 2

## 2016-08-04 NOTE — Consult Note (Signed)
CARDIOLOGY CONSULT NOTE   Patient ID: Isaac Villa MRN: [id number] DOB/AGE: [birth year] 83 y.o.  Admit date: 08/02/2016  Primary Physician   [name], MD Primary Cardiologist   Dr. Radford Pax Reason for Consultation  Aortic root dilation Requesting Physician  Dr. Alfredia Ferguson  HPI: Isaac Villa is a 83 y.o. male with a history of chronic afib (on warfarin), HTN, HLD, BPH, glaucoma, and melanoma who recently admitted after mechanical fall presented again 08/02/16 with same.   Long standing hx of afib. On digoxin and cardizem for rate control strategies. Chronic coumadin for anticoagulation.   He was admitted 07/27/16-08/01/16 admitted for symptomatic anemia secondary to large Right hip/thigh Hematoma from mechanical fall with therapeutic INR on anticoagulation. Initial Hgb of 6.4. Received 3 unit PRBC so far and FFP. Coumadin is on hold since 07/27/16. CPK in700s. Also treated for acute rhabdomyolysis and acute gouty arrthtitis of right knee. AKI resolved with holding ACE and HCTZ. He discharged to [facility] 08/01/16.   At rehab facility he slipped and fall on floor on bathroom 08/02/16 striking his left hip. Denies any prodrome like dizziness, chest pain, palpitation, shortness of breath or syncope. No PND, LE edema, orthopnea or melena. L pain worse with movement. Patient was seen by ortho and plan for total hip arthroplasty w/ anterior approach on 08/05/16 by Dr. Alvan Dame for acute left hip fracture. He also noted to have anemia with Hgb of 7.7 s/p transfused 2 units of PRBCs.   Echo this admission showed normal LV function; mild AI; mildly dilated aortic root (4.5 cm); suggest CTA or MRA to further assess; biatrialenlargement; mild RVE; mild TR; small pericardial effusion. Cardiology is asked for further evaluation of aortic root dilation.   EKG showed afib without acute changes. Telemetry showed afib at controlled rate with PVCs and intermittent pause (longest 2.25sec). Digoxin level 0.7.   Past  Medical History:  Diagnosis Date  . Atrial fibrillation (HCC)    Chronic  . BPH (benign prostatic hypertrophy)   . Edema extremities 06/04/2014  . Elevated cholesterol   . Fractured femoral neck ([place]) 08/02/2016   CLOSED FRACTURE  . Glaucoma   . Gout   . Hernia   . Herniated disc   . Hypertension   . Melanoma ([place])   . Melanoma ([place])   . Thrombocytopenia ([place])      Past Surgical History:  Procedure Laterality Date  . APPENDECTOMY    . CATARACT EXTRACTION Bilateral 04/2008  . EYE SURGERY     lens replacements  . HERNIA REPAIR    . JOINT REPLACEMENT     left knee  . [place] SURGERY  07/2004  . REPLACEMENT TOTAL KNEE Left 11/2006  . TONSILLECTOMY      Allergies  Allergen Reactions  . Enoxaparin Other (See Comments)    Reaction:  Internal bleeding  . Penicillins Other (See Comments)    Reaction:  Causes pt to pass out  Has patient had a PCN reaction causing immediate rash, facial/tongue/throat swelling, SOB or lightheadedness with hypotension: Yes Has patient had a PCN reaction causing severe rash involving mucus membranes or skin necrosis: No Has patient had a PCN reaction that required hospitalization Yes Has patient had a PCN reaction occurring within the last 10 years: No If all of the above answers are "NO", then may proceed with Cephalosporin use.    I have reviewed the patient's current medications . sodium chloride   Intravenous Once  . acetaminophen-codeine  1 tablet Oral Q6H  .  allopurinol  300 mg Oral Q1200  . digoxin  0.125 mg Oral Daily  . diltiazem  240 mg Oral Q1200  . docusate sodium  100 mg Oral BID  . finasteride  5 mg Oral QHS  . hydrochlorothiazide  25 mg Oral Daily  . lactose free nutrition  237 mL Oral TID BM  . latanoprost  1 drop Both Eyes QHS  . lisinopril  10 mg Oral Daily  . magnesium oxide  400 mg Oral Q1200  . omega-3 acid ethyl esters  1 g Oral Q1200  . phosphorus  500 mg Oral TID  . povidone-iodine  2 application Topical Once    . sodium chloride flush  3 mL Intravenous Q12H  . tamsulosin  0.4 mg Oral Daily  . traZODone  50 mg Oral QHS  . [START ON 08/05/2016] vancomycin  1,000 mg Intravenous On Call to OR   . sodium chloride 100 mL/hr at 08/02/16 2121  . lactated ringers     sodium chloride, acetaminophen **OR** acetaminophen, fentaNYL (SUBLIMAZE) injection, hydrOXYzine, ondansetron **OR** ondansetron (ZOFRAN) IV, sodium chloride flush  Prior to Admission medications   Medication Sig Start Date End Date Taking? Authorizing Provider  acetaminophen (TYLENOL) 650 MG CR tablet Take 1,300 mg by mouth every 8 (eight) hours as needed for pain.   Yes Historical Provider, MD  allopurinol (ZYLOPRIM) 300 MG tablet Take 300 mg by mouth daily at 12 noon.    Yes Historical Provider, MD  Calcium Carb-Cholecalciferol (CALCIUM + D3) 600-200 MG-UNIT TABS Take 1 tablet by mouth daily at 12 noon.    Yes Historical Provider, MD  digoxin (LANOXIN) 0.125 MG tablet Take 0.125 mg by mouth daily.   Yes Historical Provider, MD  diltiazem (CARDIZEM CD) 240 MG 24 hr capsule Take 240 mg by mouth daily at 12 noon.   Yes Historical Provider, MD  docusate sodium (COLACE) 100 MG capsule Take 1 capsule (100 mg total) by mouth 2 (two) times daily. 08/01/16  Yes Nishant Dhungel, MD  finasteride (PROSCAR) 5 MG tablet Take 5 mg by mouth at bedtime.    Yes Historical Provider, MD  hydrochlorothiazide (HYDRODIURIL) 25 MG tablet Take 25 mg by mouth daily.   Yes Historical Provider, MD  HYDROcodone-acetaminophen (NORCO/VICODIN) 5-325 MG tablet Take 1 tablet by mouth every 6 (six) hours as needed for moderate pain. 08/01/16  Yes Nishant Dhungel, MD  lactose free nutrition (BOOST PLUS) LIQD Take 237 mLs by mouth 3 (three) times daily between meals. 08/01/16  Yes Nishant Dhungel, MD  latanoprost (XALATAN) 0.005 % ophthalmic solution Place 1 drop into both eyes at bedtime.    Yes Historical Provider, MD  lisinopril (PRINIVIL,ZESTRIL) 10 MG tablet Take 1 tablet (10  mg total) by mouth daily. 12/23/14  Yes Sueanne Margarita, MD  magnesium oxide (MAGNESIUM-OXIDE) 400 (241.3 Mg) MG tablet Take 400 mg by mouth daily at 12 noon.   Yes Historical Provider, MD  Multiple Vitamin (MULTIVITAMIN WITH MINERALS) TABS tablet Take 1 tablet by mouth daily at 12 noon.   Yes Historical Provider, MD  omega-3 acid ethyl esters (LOVAZA) 1 g capsule Take 1 g by mouth daily at 12 noon.   Yes Historical Provider, MD  potassium chloride SA (K-DUR,KLOR-CON) 20 MEQ tablet Take 20 mEq by mouth 2 (two) times daily.   Yes Historical Provider, MD  predniSONE (DELTASONE) 20 MG tablet Take 2 tablets (40 mg total) by mouth daily. 08/02/16 08/06/16 Yes Nishant Dhungel, MD  tamsulosin (FLOMAX) 0.4 MG CAPS capsule Take  0.4 mg by mouth daily.   Yes Historical Provider, MD  warfarin (COUMADIN) 4 MG tablet Take 4 mg by mouth daily. 06/06/16  Yes Historical Provider, MD     Social History   Social History  . Marital status: Married    Spouse name: N/A  . Number of children: N/A  . Years of education: N/A   Occupational History  . Not on file.   Social History Main Topics  . Smoking status: Former Research scientist (life sciences)  . Smokeless tobacco: Never Used  . Alcohol use Yes     Comment: 3-4 glasses of wine per week  . Drug use: No  . Sexual activity: Not on file   Other Topics Concern  . Not on file   Social History Narrative  . No narrative on file    Family Status  Relation Status  . Mother Deceased   Stroke  . Father Deceased   aneurysm  . Brother Alive   prostate cancer   Family History  Problem Relation Age of Onset  . Hypertension Mother   . Prostate cancer Father     ROS:  Full 14 point review of systems complete and found to be negative unless listed above.  Physical Exam: Blood pressure 121/63, pulse 69, temperature 97.8 F (36.6 C), temperature source Oral, resp. rate 16, SpO2 99 %.  General: Well developed, well nourished, male in no acute distress Head: Eyes PERRLA, No  xanthomas. Normocephalic and atraumatic, oropharynx without edema or exudate.  Lungs: Resp regular and unlabored, CTA. Heart: Ir Ir  no s3, s4, or murmurs..   Neck: No carotid bruits. No lymphadenopathy. No  JVD. Abdomen: Bowel sounds present, abdomen soft and non-tender without masses or hernias noted. Msk:  No spine or cva tenderness. No weakness, no joint deformities or effusions. Extremities: No clubbing, cyanosis or edema. DP/PT/Radials 2+ and equal bilaterally. Neuro: Alert and oriented X 3. No focal deficits noted. Psych:  Good affect, responds appropriately Skin: Large ecchymosis/hematoma on right lower leg and buttocks.  Labs:   Lab Results  Component Value Date   WBC 12.2 (H) 08/04/2016   HGB 10.3 (L) 08/04/2016   HCT 32.9 (L) 08/04/2016   MCV 92.4 08/04/2016   PLT 228 08/04/2016    Recent Labs  08/03/16 0510  INR 1.28    Recent Labs Lab 08/04/16 0541  NA 139  K 4.3  CL 108  CO2 24  BUN 24*  CREATININE 0.88  CALCIUM 8.1*  PROT 4.9*  BILITOT 1.8*  ALKPHOS 58  ALT 17  AST 23  GLUCOSE 101*  ALBUMIN 2.3*   Magnesium  Date Value Ref Range Status  08/04/2016 2.0 1.7 - 2.4 mg/dL Final   No results for input(s): CKTOTAL, CKMB, TROPONINI in the last 72 hours. No results for input(s): TROPIPOC in the last 72 hours. No results found for: PROBNP No results found for: CHOL, HDL, LDLCALC, TRIG No results found for: DDIMER No results found for: LIPASE, AMYLASE No results found for: TSH, T4TOTAL, T3FREE, THYROIDAB Vitamin B-12  Date/Time Value Ref Range Status  08/02/2016 03:28 PM 884 180 - 914 pg/mL Final    Comment:    (NOTE) This assay is not validated for testing neonatal or myeloproliferative syndrome specimens for Vitamin B12 levels.    Folate  Date/Time Value Ref Range Status  08/02/2016 03:28 PM 20.9 >5.9 ng/mL Final   Ferritin  Date/Time Value Ref Range Status  08/02/2016 03:28 PM 673 (H) 24 - 336 ng/mL Final  TIBC  Date/Time Value Ref  Range Status  08/02/2016 03:28 PM 192 (L) 250 - 450 ug/dL Final   Iron  Date/Time Value Ref Range Status  08/02/2016 03:28 PM 47 45 - 182 ug/dL Final   Retic Ct Pct  Date/Time Value Ref Range Status  08/02/2016 03:28 PM 5.2 (H) 0.4 - 3.1 % Final    Echo: 08/03/16 LV EF: 50% -   55%  ------------------------------------------------------------------- Indications:      Atrial fibrillation - 427.31.  ------------------------------------------------------------------- History:   PMH:   Atrial fibrillation.  Risk factors: Hypertension.  ------------------------------------------------------------------- Study Conclusions  - Left ventricle: The cavity size was normal. There was mild focal basal hypertrophy of the septum. Systolic function was normal. The estimated ejection fraction was in the range of 50% to 55%.  all motion was normal; there were no regional wall motion abnormalities. - Ventricular septum: Septal motion showed abnormal function and dyssynergy. - Aortic valve: There was mild regurgitation. - Aortic root: The aortic root was mildly dilated. - Mitral valve: Calcified annulus. - Left atrium: The atrium was severely dilated. - Right ventricle: The cavity size was mildly dilated. - Right atrium: The atrium was severely dilated. - Pulmonary arteries: PA peak pressure: 32 mm Hg (S). - Pericardium, extracardiac: A small pericardial effusion was   identified.  Impressions:  - Definity used; normal LV function; mild AI; mildly dilated aortic root (4.5 cm); suggest CTA or MRA to further assess; biatrial enlargement; mild RVE; mild TR; small pericardial effusion.  Radiology:  Ct Head Wo Contrast  Result Date: 08/02/2016 CLINICAL DATA:  Head injury after fall. EXAM: CT HEAD WITHOUT CONTRAST TECHNIQUE: Contiguous axial images were obtained from the base of the skull through the vertex without intravenous contrast. COMPARISON:  CT scan of July 24, 2016. FINDINGS: Brain:  Mild diffuse cortical atrophy is noted. Minimal chronic ischemic white matter disease is noted. No mass effect or midline shift is noted. Ventricular size is within normal limits. There is no evidence of mass lesion, hemorrhage or acute infarction. Vascular: Atherosclerosis of carotid siphons is noted. Skull: Normal. Negative for fracture or focal lesion. Sinuses/Orbits: No acute finding. Other: None. IMPRESSION: Mild diffuse cortical atrophy. Minimal chronic ischemic white matter disease. No acute intracranial abnormality seen. Electronically Signed   By: Marijo Conception, M.D.   On: 08/02/2016 13:43    ASSESSMENT AND PLAN:     1. Aortic root dilation - Incidental findings. Asymptomatic. 4.5cm by echo. Recommended CTA but can be done as OP - no sign of AS (with minimal SEM on exam = Sclerosis) & mild Aortic regurgitation (normal for age)  2. Permanent atrial fibrillation - Controlled rate with intermittent pause (longest 2.25sec). Asymptomatic. On digoxin and Cardizem for rate control. - monitor for pause ~> 3 Sec, but otherwise not worrisome.  - Normal EF on echo - no active CHF - CHADSVASc score of 3. Coumadin on hold since 07/27/16. Need to discuss long term anticoagulation given 2 fall in 1 week.  - per Ortho - plan to restart post-op for PE prophylaxis -- would not bridge.  - Pending rehab stability, may not continue long term.  3. Symptomatic anemia - Required 2 PRBCs this admission. Also received 3 unit PRBC so far and FFP during last admission. Hgb improved significantly to 10.3 today.  - may need peri-op transfusion.  4. HTN - BP stable and well controlled. Continue current medications.   SignedLeanor Kail, [place] 08/04/2016, 1:11 PM Pager [phone]  Co-Sign MD  I have seen, examined and evaluated the patient this PM along with Mr. Curly [place].  After reviewing all the available data and chart, we discussed the patients laboratory, study & physical findings as well as symptoms in  detail. I agree with his findings, examination as well as impression recommendations as per our discussion.    My attestation in bold italics - agree with concern re: full Anticoagulation with recent falls -- will need to assess during rehab. If stability is a concern - would not restart warfarin & use ASA 81 mg instead.  Will follow-along peripherally over weekend. Please notify us when d/c planned for additional recommendations post-op.   Glenetta Hew, M.D., M.S. Interventional Cardiologist   Pager # [phone] Phone # [phone] [address] [place], [place] [address]

## 2016-08-04 NOTE — Anesthesia Preprocedure Evaluation (Addendum)
Anesthesia Evaluation  Patient identified by MRN, date of birth, ID band Patient confused    Reviewed: Allergy & Precautions, NPO status , Patient's Chart, lab work & pertinent test results  Airway Mallampati: II  TM Distance: >3 FB Neck ROM: Full    Dental   Pulmonary neg pulmonary ROS, former smoker,    Pulmonary exam normal breath sounds clear to auscultation       Cardiovascular hypertension, Pt. on medications Normal cardiovascular exam Rhythm:Regular Rate:Normal     Neuro/Psych negative neurological ROS  negative psych ROS   GI/Hepatic negative GI ROS, Neg liver ROS,   Endo/Other  negative endocrine ROS  Renal/GU Renal disease     Musculoskeletal negative musculoskeletal ROS (+)   Abdominal   Peds  Hematology negative hematology ROS (+) anemia ,   Anesthesia Other Findings   Reproductive/Obstetrics negative OB ROS                           Anesthesia Physical Anesthesia Plan  ASA: III  Anesthesia Plan: Spinal   Post-op Pain Management:    Induction:   Airway Management Planned:   Additional Equipment:   Intra-op Plan:   Post-operative Plan:   Informed Consent: I have reviewed the patients History and Physical, chart, labs and discussed the procedure including the risks, benefits and alternatives for the proposed anesthesia with the patient or authorized representative who has indicated his/her understanding and acceptance.   Dental advisory given  Plan Discussed with: CRNA  Anesthesia Plan Comments:         Anesthesia Quick Evaluation

## 2016-08-04 NOTE — Progress Notes (Addendum)
Per daughters' request, they would like pt considered for a possible inpatient acute rehab admission postoperatively. Prior to recent hospitalization, pt was independent, driving and active. Recent d/c to SNF just previous to this readmission. I will follow up next week after therapy evaluations to assist with planning dispo options as appropriate. [id number]

## 2016-08-04 NOTE — Progress Notes (Signed)
PROGRESS NOTE    Isaac Villa  [id number] DOB: 07-26-1924 DOA: 08/02/2016 PCP: [name], MD   Brief Narrative:  Isaac Villa is a 83 y.o. male with medical history significant of atrophic fibrillation, BPH, HLD, glaucoma, gout, HTN, melanoma. Of note patient may recently admitted for approximately 5 day stay due to symptomatic anemia secondary to right thigh hematoma which was sustained after a mechanical fall on anticoagulation with supratherapeutic INR. Patient had been discharged from the hospital for 24 hours prior to readmission. Patient reports ambulating across the tile floor in an effort to use the bathroom when his feet slipped from underneath him. Patient fell to the ground striking his left hip. Unsure if he struck his head but denies any specific focal pain complaints outside of his left hip, chest pain, palpitations, nausea, vomiting, shortness of breath, fevers, abdominal pain, dysuria, frequency, back pain. Pain in left hip is constant. Worse with movement. Admitted for Left Hip Fracture and transfused 2 units of pRBC's. Orthopedics planning on Surgery on Saturday. Patient with Delirium but able to be redirected.   Assessment & Plan:   Active Problems:   Chronic atrial fibrillation (HCC)   Essential hypertension, benign   Symptomatic anemia   Traumatic hematoma of buttock   Hip fracture (HCC)   BPH (benign prostatic hyperplasia)   Protein calorie malnutrition (HCC)   Protein-calorie malnutrition, severe  Acute Left Hip fracture:  -sustained after mechanical fall. Plan is for total hip arthroplasty w/ anterior approach on 08/05/16 by Dr. Alvan Dame. Pt w/ approximately 1% preop cardiovascular risk based on Gupta scale. - preop workup initiated - NPO after midnight on 08/04/16 (order placed) - continue to hold coumadin (last dose 07/27/16); May consider transitioning to ASA 81 mg as an outpatient - S/p 2 units PRBC per ortho request - Foley per ortho request.  - Pain mgt  adjusted; Discontinued Lorazepam and Robaxin, Tylenol increased to 1000 mg q8h and Tylenol 3 changed back to q6hprn by Ortho - Air overlay mattress - PT/OT after surgical repair. Given pts frequent falls due to deconditioning and now hip fracture, CIR may be appropriate for pt once able. - Echo showed Definity used; normal LV function; mild AI; mildly dilated aortic   root (4.5 cm); suggest CTA or MRA to further assess; biatrial enlargement; mild RVE; mild TR; small pericardial effusion. -CTA can be done as an outpatient per Cardiology -ECHO shows no active CHF  Hospital Delirium -Avoid Sedating Medications -Bed Hygiene -If worsens will consider scanning head -Delirum Precautions -Ordered MRI however will not be done as will be difficult to get patient on table because of broken hip -Continue with Redirection  Anemia:  -7.7 on admission. Now improved to 10.3 -This was due to large Hematoma development in R hip secondary to p[revious mechanical fall and supratherapeutic INR. Baseline Hgb unclear. Normocytic.  - s/p 2 units PRBC per ortho; May consider giving another 2 doses peri-op/ intraoperatively - Anemia panel - CBC in am  Afib: chronic. Rate controlled - continue Diltiazem, Digoxin - Coumadin on hold for surgery; Likley need to be changed to ASA 81 mg - Cardiology not worried about pauses unless greater than 3 secs - Echo as above  HTN: - continue Diltiazem, HCTZ,  lisinopril in am when pressure improves.   BPH: - Continue flomax, Proscar  Acute gouty arthritis:  -stated on Prednisone during last admission for R knee swelling and pain. Improved w/ steroids. 5 day course set to finish on 08/04/16 - continue allopurinol -  Complete prednisone regimen.   Protein calorie malnutrition: - nutrition consult - Pre-albumin - feeding supplement  Sinus Pause -Pause was 2.25 seconds. Continue to Monitor for greater than 3 seconds -Will check Electrolytes and  repletes -Continue to Monitor closely; will hold Digoxin and Diltiazem if continues  Mildly Dilated Aortic Root -Discussed with Cardiology and CTA can be done as an outpatient  DVT prophylaxis: SCDs Code Status: FULL CODE Family Communication: Discussed with Daughters and Wife at bedside Disposition Plan: Likely SNF or CIR pending PT evaluation.  Consultants:   Orthopedic Surgery  Cardiology  Procedures: ECHOCARDIOGRAM Study Conclusions  - Left ventricle: The cavity size was normal. There was mild focal   basal hypertrophy of the septum. Systolic function was normal.   The estimated ejection fraction was in the range of 50% to 55%.   Wall motion was normal; there were no regional wall motion   abnormalities. - Ventricular septum: Septal motion showed abnormal function and   dyssynergy. - Aortic valve: There was mild regurgitation. - Aortic root: The aortic root was mildly dilated. - Mitral valve: Calcified annulus. - Left atrium: The atrium was severely dilated. - Right ventricle: The cavity size was mildly dilated. - Right atrium: The atrium was severely dilated. - Pulmonary arteries: PA peak pressure: 32 mm Hg (S). - Pericardium, extracardiac: A small pericardial effusion was   identified.  Impressions:  - Definity used; normal LV function; mild AI; mildly dilated aortic   root (4.5 cm); suggest CTA or MRA to further assess; biatrial   enlargement; mild RVE; mild TR; small pericardial effusion   Antimicrobials:  Anti-infectives    Start     Dose/Rate Route Frequency Ordered Stop   08/05/16 1000  vancomycin (VANCOCIN) IVPB 1000 mg/200 mL premix     1,000 mg 200 mL/hr over 60 Minutes Intravenous On call to O.R. 08/03/16 [id number] 08/06/16 0559   08/03/16 0600  vancomycin (VANCOCIN) IVPB 1000 mg/200 mL premix  Status:  Discontinued     1,000 mg 200 mL/hr over 60 Minutes Intravenous On call to O.R. 08/02/16 1843 08/03/16 [id number]     Subjective: Seen at bedside and was  delirious and wanting to go to someone's apartment. No nausea or vomiting. Appeared to be in some pain. Family very concerned about his acute change. Discussed with them thoroughly about him likely having delirium and answered all questions to their satisfaction.   Objective: Vitals:   08/04/16 0620 08/04/16 0823 08/04/16 0851 08/04/16 1232  BP: (!) 144/52 128/64  121/63  Pulse: 76 68 72 69  Resp: 16     Temp: 97.8 F (36.6 C)     TempSrc: Oral     SpO2: 96% 98%  99%    Intake/Output Summary (Last 24 hours) at 08/04/16 1856 Last data filed at 08/04/16 1500  Gross per 24 hour  Intake                3 ml  Output              600 ml  Net             -597 ml   There were no vitals filed for this visit.  Examination: Physical Exam:  Constitutional:  NAD and appears calm and comfortable but pleasantly confused Eyes: Lids and conjunctivae normal, sclerae anicteric  ENMT: External Ears, Nose appear normal. Grossly normal hearing.  Neck: Appears normal, supple, no cervical masses, normal ROM, no appreciable thyromegaly, no JVD Respiratory: Clear to auscultation  bilaterally, no wheezing, rales, rhonchi or crackles. Normal respiratory effort and patient is not tachypenic. No accessory muscle use.  Cardiovascular: RRR, no murmurs / rubs / gallops. S1 and S2 auscultated.  Abdomen: Soft, non-tender, non-distended. No masses palpated. No appreciable hepatosplenomegaly. Bowel sounds positive.  GU: Deferred. Musculoskeletal: No clubbing / cyanosis of digits/nails. Left Leg externally rotated.  Skin: Large ecchymosis/hematoma on right lower leg and buttocks. Neurologic: CN 2-12 grossly intact with no focal deficits. Romberg sign cerebellar reflexes not assessed.  Psychiatric: Impaired judgment and insight. Alert but not oriented x 3. Normal mood and appropriate affect.   Data Reviewed: I have personally reviewed following labs and imaging studies  CBC:  Recent Labs Lab 07/31/16 0555  08/01/16 0852 08/02/16 1255 08/03/16 0510 08/04/16 0541  WBC 11.2* 10.3 17.0* 10.3 12.2*  NEUTROABS  --   --  15.1*  --  10.4*  HGB 7.5* 7.8* 7.7* 9.1* 10.3*  HCT 23.5* 24.4* 24.0* 28.5* 32.9*  MCV 93.6 93.1 93.0 90.5 92.4  PLT 168 195 246 212 [id number]   Basic Metabolic Panel:  Recent Labs Lab 07/30/16 0415 08/02/16 1255 08/03/16 0510 08/03/16 1917 08/04/16 0541  NA 136 136 142 139 139  K 4.0 4.0 3.6 4.2 4.3  CL 103 101 108 108 108  CO2 25 26 25 25 24   GLUCOSE 125* 154* 93 103* 101*  BUN 32* 51* 37* 30* 24*  CREATININE 1.10 0.92 0.77 0.82 0.88  CALCIUM 8.0* 8.5* 7.3* 8.1* 8.1*  MG  --   --   --  2.0 2.0  PHOS  --   --   --  2.4* 2.2*   GFR: Estimated Creatinine Clearance: 62.8 mL/min (by C-G formula based on SCr of 0.88 mg/dL). Liver Function Tests:  Recent Labs Lab 08/03/16 0510 08/04/16 0541  AST 28 23  ALT 18 17  ALKPHOS 44 58  BILITOT 1.8* 1.8*  PROT 4.2* 4.9*  ALBUMIN 1.9* 2.3*   No results for input(s): LIPASE, AMYLASE in the last 168 hours. No results for input(s): AMMONIA in the last 168 hours. Coagulation Profile:  Recent Labs Lab 07/29/16 0823 08/02/16 1255 08/03/16 0510  INR 1.22 1.18 1.28   Cardiac Enzymes:  Recent Labs Lab 07/29/16 0823 07/29/16 1212  CKTOTAL 715* 657*   BNP (last 3 results) No results for input(s): PROBNP in the last 8760 hours. HbA1C: No results for input(s): HGBA1C in the last 72 hours. CBG:  Recent Labs Lab 07/31/16 1219 07/31/16 1731 07/31/16 2208 08/01/16 0738 08/01/16 1228  GLUCAP 145* 150* 137* 165* 183*   Lipid Profile: No results for input(s): CHOL, HDL, LDLCALC, TRIG, CHOLHDL, LDLDIRECT in the last 72 hours. Thyroid Function Tests: No results for input(s): TSH, T4TOTAL, FREET4, T3FREE, THYROIDAB in the last 72 hours. Anemia Panel:  Recent Labs  08/02/16 1528  VITAMINB12 884  FOLATE 20.9  FERRITIN 673*  TIBC 192*  IRON 47  RETICCTPCT 5.2*   Sepsis Labs: No results for input(s):  PROCALCITON, LATICACIDVEN in the last 168 hours.  Recent Results (from the past 240 hour(s))  Surgical PCR screen     Status: None   Collection Time: 08/03/16  4:00 AM  Result Value Ref Range Status   MRSA, PCR NEGATIVE NEGATIVE Final   Staphylococcus aureus NEGATIVE NEGATIVE Final    Comment:        The Xpert SA Assay (FDA approved for NASAL specimens in patients over 79 years of age), is one component of a comprehensive surveillance program.  Test performance  has been validated by [facility] for patients greater than or equal to 70 year old. It is not intended to diagnose infection nor to guide or monitor treatment.     Radiology Studies: No results found. Scheduled Meds: . sodium chloride   Intravenous Once  . acetaminophen  1,000 mg Oral Q8H   Or  . acetaminophen  650 mg Rectal Q8H  . allopurinol  300 mg Oral Q1200  . digoxin  0.125 mg Oral Daily  . diltiazem  240 mg Oral Q1200  . docusate sodium  100 mg Oral BID  . finasteride  5 mg Oral QHS  . hydrochlorothiazide  25 mg Oral Daily  . lactose free nutrition  237 mL Oral TID BM  . latanoprost  1 drop Both Eyes QHS  . lisinopril  10 mg Oral Daily  . magnesium oxide  400 mg Oral Q1200  . omega-3 acid ethyl esters  1 g Oral Q1200  . phosphorus  500 mg Oral TID  . povidone-iodine  2 application Topical Once  . sodium chloride flush  3 mL Intravenous Q12H  . tamsulosin  0.4 mg Oral Daily  . traZODone  50 mg Oral QHS  . [START ON 08/05/2016] vancomycin  1,000 mg Intravenous On Call to OR   Continuous Infusions: . sodium chloride 100 mL/hr at 08/02/16 2121  . lactated ringers       LOS: 2 days   Kerney Elbe, DO [facility] Pager [phone]  If 7PM-7AM, please contact night-coverage [email or web address] Password [facility] 08/04/2016, 6:56 PM

## 2016-08-05 ENCOUNTER — Inpatient Hospital Stay (HOSPITAL_COMMUNITY): Payer: Medicare Other | Admitting: Anesthesiology

## 2016-08-05 ENCOUNTER — Encounter (HOSPITAL_COMMUNITY)
Admission: EM | Disposition: A | Discharge status: Rehabilitation Facility | Payer: Self-pay | Source: Home / Self Care | Attending: Internal Medicine

## 2016-08-05 ENCOUNTER — Encounter (HOSPITAL_COMMUNITY): Payer: Self-pay | Admitting: Certified Registered"

## 2016-08-05 ENCOUNTER — Inpatient Hospital Stay (HOSPITAL_COMMUNITY): Payer: Medicare Other

## 2016-08-05 HISTORY — PX: ANTERIOR APPROACH HEMI HIP ARTHROPLASTY: SHX6690

## 2016-08-05 LAB — COMPREHENSIVE METABOLIC PANEL
ALT: 17 U/L (ref 17–63)
ANION GAP: 11 (ref 5–15)
AST: 24 U/L (ref 15–41)
Albumin: 2.3 g/dL — ABNORMAL LOW (ref 3.5–5.0)
Alkaline Phosphatase: 59 U/L (ref 38–126)
BUN: 18 mg/dL (ref 6–20)
CO2: 23 mmol/L (ref 22–32)
Calcium: 8.2 mg/dL — ABNORMAL LOW (ref 8.9–10.3)
Chloride: 105 mmol/L (ref 101–111)
Creatinine, Ser: 0.87 mg/dL (ref 0.61–1.24)
GFR calc Af Amer: >60 mL/min (ref >60)
GFR calc non Af Amer: >60 mL/min (ref >60)
GLUCOSE: 131 mg/dL — AB (ref 65–99)
POTASSIUM: 4.2 mmol/L (ref 3.5–5.1)
SODIUM: 139 mmol/L (ref 135–145)
Total Bilirubin: 2.3 mg/dL — ABNORMAL HIGH (ref 0.3–1.2)
Total Protein: 4.9 g/dL — ABNORMAL LOW (ref 6.5–8.1)

## 2016-08-05 LAB — CBC WITH DIFFERENTIAL/PLATELET
BASOS PCT: 0 %
Basophils Absolute: 0 10*3/uL (ref 0.0–0.1)
EOS ABS: 0 10*3/uL (ref 0.0–0.7)
Eosinophils Relative: 0 %
HCT: 33.5 % — ABNORMAL LOW (ref 39.0–52.0)
HEMOGLOBIN: 10.6 g/dL — AB (ref 13.0–17.0)
Lymphocytes Relative: 3 %
Lymphs Abs: 0.6 10*3/uL — ABNORMAL LOW (ref 0.7–4.0)
MCH: 29.6 pg (ref 26.0–34.0)
MCHC: 31.6 g/dL (ref 30.0–36.0)
MCV: 93.6 fL (ref 78.0–100.0)
MONOS PCT: 3 %
Monocytes Absolute: 0.5 10*3/uL (ref 0.1–1.0)
NEUTROS PCT: 94 %
Neutro Abs: 20.4 10*3/uL — ABNORMAL HIGH (ref 1.7–7.7)
Platelets: 224 10*3/uL (ref 150–400)
RBC: 3.58 MIL/uL — AB (ref 4.22–5.81)
RDW: 16.8 % — ABNORMAL HIGH (ref 11.5–15.5)
WBC: 21.6 10*3/uL — AB (ref 4.0–10.5)

## 2016-08-05 LAB — URINE CULTURE

## 2016-08-05 LAB — MAGNESIUM: MAGNESIUM: 1.9 mg/dL (ref 1.7–2.4)

## 2016-08-05 LAB — PHOSPHORUS: PHOSPHORUS: 3.4 mg/dL (ref 2.5–4.6)

## 2016-08-05 SURGERY — HEMIARTHROPLASTY, HIP, DIRECT ANTERIOR APPROACH, FOR FRACTURE
Anesthesia: General | Laterality: Left

## 2016-08-05 MED ORDER — DEXAMETHASONE SODIUM PHOSPHATE 10 MG/ML IJ SOLN
INTRAMUSCULAR | Status: AC
  Filled 2016-08-05: qty 1

## 2016-08-05 MED ORDER — WARFARIN - PHARMACIST DOSING INPATIENT: Freq: Every day | Status: DC

## 2016-08-05 MED ORDER — VANCOMYCIN HCL IN DEXTROSE 1-5 GM/200ML-% IV SOLN
1000.0000 mg | Freq: Two times a day (BID) | INTRAVENOUS | Status: AC
  Administered 2016-08-05: 1000 mg via INTRAVENOUS
  Filled 2016-08-05: qty 200

## 2016-08-05 MED ORDER — ONDANSETRON HCL 4 MG/2ML IJ SOLN
INTRAMUSCULAR | Status: AC
  Filled 2016-08-05: qty 2

## 2016-08-05 MED ORDER — POLYETHYLENE GLYCOL 3350 17 G PO PACK: 17.0000 g | PACK | Freq: Every day | ORAL | Status: DC | PRN

## 2016-08-05 MED ORDER — BUPIVACAINE HCL (PF) 0.5 % IJ SOLN
INTRAMUSCULAR | Status: DC | PRN
  Administered 2016-08-05: 3 mL via INTRATHECAL

## 2016-08-05 MED ORDER — FERROUS SULFATE 325 (65 FE) MG PO TABS
325.0000 mg | ORAL_TABLET | Freq: Three times a day (TID) | ORAL | Status: DC
  Administered 2016-08-06 – 2016-08-08 (×4): 325 mg via ORAL
  Filled 2016-08-05 (×6): qty 1

## 2016-08-05 MED ORDER — SODIUM CHLORIDE 0.9 % IR SOLN
Status: DC | PRN
  Administered 2016-08-05: 1

## 2016-08-05 MED ORDER — PHENYLEPHRINE HCL 10 MG/ML IJ SOLN
INTRAMUSCULAR | Status: DC | PRN
  Administered 2016-08-05 (×5): 80 ug via INTRAVENOUS

## 2016-08-05 MED ORDER — SUFENTANIL CITRATE 50 MCG/ML IV SOLN
INTRAVENOUS | Status: DC | PRN
  Administered 2016-08-05: 5 ug via INTRAVENOUS

## 2016-08-05 MED ORDER — DEXAMETHASONE SODIUM PHOSPHATE 10 MG/ML IJ SOLN
INTRAMUSCULAR | Status: DC | PRN
  Administered 2016-08-05: 10 mg via INTRAVENOUS

## 2016-08-05 MED ORDER — PROPOFOL 10 MG/ML IV BOLUS
INTRAVENOUS | Status: DC | PRN
  Administered 2016-08-05: 50 mg via INTRAVENOUS
  Administered 2016-08-05: 70 mg via INTRAVENOUS
  Administered 2016-08-05: 30 mg via INTRAVENOUS
  Administered 2016-08-05: 50 mg via INTRAVENOUS

## 2016-08-05 MED ORDER — SUFENTANIL CITRATE 50 MCG/ML IV SOLN
INTRAVENOUS | Status: AC
  Filled 2016-08-05: qty 1

## 2016-08-05 MED ORDER — PROPOFOL 10 MG/ML IV BOLUS
INTRAVENOUS | Status: AC
  Filled 2016-08-05: qty 20

## 2016-08-05 MED ORDER — MENTHOL 3 MG MT LOZG: 1.0000 | LOZENGE | OROMUCOSAL | Status: DC | PRN

## 2016-08-05 MED ORDER — PHENOL 1.4 % MT LIQD: 1.0000 | OROMUCOSAL | Status: DC | PRN

## 2016-08-05 MED ORDER — LIDOCAINE 2% (20 MG/ML) 5 ML SYRINGE
INTRAMUSCULAR | Status: AC
  Filled 2016-08-05: qty 5

## 2016-08-05 MED ORDER — WARFARIN SODIUM 4 MG PO TABS
4.0000 mg | ORAL_TABLET | Freq: Once | ORAL | Status: AC
  Administered 2016-08-05: 4 mg via ORAL
  Filled 2016-08-05: qty 1

## 2016-08-05 MED ORDER — METOCLOPRAMIDE HCL 5 MG/ML IJ SOLN: 5.0000 mg | Freq: Three times a day (TID) | INTRAMUSCULAR | Status: DC | PRN

## 2016-08-05 MED ORDER — METOCLOPRAMIDE HCL 5 MG PO TABS: 5.0000 mg | ORAL_TABLET | Freq: Three times a day (TID) | ORAL | Status: DC | PRN

## 2016-08-05 MED ORDER — LIDOCAINE HCL (CARDIAC) 20 MG/ML IV SOLN
INTRAVENOUS | Status: DC | PRN
  Administered 2016-08-05: 100 mg via INTRATRACHEAL

## 2016-08-05 MED ORDER — SODIUM CHLORIDE 0.9 % IJ SOLN
INTRAMUSCULAR | Status: AC
  Filled 2016-08-05: qty 10

## 2016-08-05 MED ORDER — ONDANSETRON HCL 4 MG/2ML IJ SOLN
INTRAMUSCULAR | Status: DC | PRN
  Administered 2016-08-05: 4 mg via INTRAVENOUS

## 2016-08-05 MED ORDER — LACTATED RINGERS IV SOLN
INTRAVENOUS | Status: DC | PRN
  Administered 2016-08-05 (×2): via INTRAVENOUS

## 2016-08-05 MED ORDER — PHENYLEPHRINE HCL 10 MG/ML IJ SOLN
INTRAVENOUS | Status: DC | PRN
  Administered 2016-08-05: 100 ug/min via INTRAVENOUS

## 2016-08-05 MED ORDER — PHENYLEPHRINE 40 MCG/ML (10ML) SYRINGE FOR IV PUSH (FOR BLOOD PRESSURE SUPPORT)
PREFILLED_SYRINGE | INTRAVENOUS | Status: AC
  Filled 2016-08-05: qty 10

## 2016-08-05 SURGICAL SUPPLY — 60 items
BLADE SAW SGTL 18X1.27X75 (BLADE) ×2 IMPLANT
BLADE SAW SGTL 18X1.27X75MM (BLADE) ×1
BLADE SURG ROTATE 9660 (MISCELLANEOUS) IMPLANT
CAPT HIP TOTAL 2 ×3 IMPLANT
CELLS DAT CNTRL 66122 CELL SVR (MISCELLANEOUS) ×1 IMPLANT
COVER BACK TABLE 24X17X13 BIG (DRAPES) IMPLANT
COVER SURGICAL LIGHT HANDLE (MISCELLANEOUS) ×3 IMPLANT
DERMABOND ADVANCED (GAUZE/BANDAGES/DRESSINGS) ×2
DERMABOND ADVANCED .7 DNX12 (GAUZE/BANDAGES/DRESSINGS) ×1 IMPLANT
DRAPE C-ARM 42X72 X-RAY (DRAPES) ×3 IMPLANT
DRAPE IMP U-DRAPE 54X76 (DRAPES) ×3 IMPLANT
DRAPE STERI IOBAN 125X83 (DRAPES) ×3 IMPLANT
DRAPE U-SHAPE 47X51 STRL (DRAPES) ×9 IMPLANT
DRSG AQUACEL AG ADV 3.5X10 (GAUZE/BANDAGES/DRESSINGS) ×3 IMPLANT
DRSG TEGADERM 4X4.75 (GAUZE/BANDAGES/DRESSINGS) ×3 IMPLANT
DURAPREP 26ML APPLICATOR (WOUND CARE) ×3 IMPLANT
ELECT BLADE TIP CTD 4 INCH (ELECTRODE) IMPLANT
ELECT REM PT RETURN 9FT ADLT (ELECTROSURGICAL) ×3
ELECTRODE REM PT RTRN 9FT ADLT (ELECTROSURGICAL) ×1 IMPLANT
EVACUATOR 1/8 PVC DRAIN (DRAIN) ×3 IMPLANT
FACESHIELD WRAPAROUND (MASK) ×3 IMPLANT
GAUZE SPONGE 2X2 8PLY STRL LF (GAUZE/BANDAGES/DRESSINGS) ×1 IMPLANT
GLOVE BIOGEL PI IND STRL 7.5 (GLOVE) ×1 IMPLANT
GLOVE BIOGEL PI IND STRL 8 (GLOVE) ×1 IMPLANT
GLOVE BIOGEL PI INDICATOR 7.5 (GLOVE) ×2
GLOVE BIOGEL PI INDICATOR 8 (GLOVE) ×2
GLOVE ECLIPSE 8.0 STRL XLNG CF (GLOVE) ×6 IMPLANT
GLOVE ORTHO TXT STRL SZ7.5 (GLOVE) ×3 IMPLANT
GOWN BRE IMP PREV XXLGXLNG (GOWN DISPOSABLE) ×3 IMPLANT
GOWN STRL REIN 3XL XLG LVL4 (GOWN DISPOSABLE) ×3 IMPLANT
GOWN STRL REUS W/ TWL LRG LVL3 (GOWN DISPOSABLE) ×2 IMPLANT
GOWN STRL REUS W/TWL LRG LVL3 (GOWN DISPOSABLE) ×4
KIT BASIN OR (CUSTOM PROCEDURE TRAY) ×3 IMPLANT
KIT ROOM TURNOVER OR (KITS) ×3 IMPLANT
LINER NEUTRAL 54X36MM PLUS 4 (Hips) ×3 IMPLANT
MANIFOLD NEPTUNE II (INSTRUMENTS) ×3 IMPLANT
NS IRRIG 1000ML POUR BTL (IV SOLUTION) ×3 IMPLANT
PACK TOTAL JOINT (CUSTOM PROCEDURE TRAY) ×3 IMPLANT
PACK UNIVERSAL I (CUSTOM PROCEDURE TRAY) ×3 IMPLANT
PAD ARMBOARD 7.5X6 YLW CONV (MISCELLANEOUS) ×6 IMPLANT
RESTRAINT LIMB HOLDER UNIV (RESTRAINTS) ×3 IMPLANT
RTRCTR WOUND ALEXIS 18CM MED (MISCELLANEOUS) ×3
SCREW 6.5MMX30MM (Screw) ×3 IMPLANT
SPONGE GAUZE 2X2 STER 10/PKG (GAUZE/BANDAGES/DRESSINGS) ×2
SPONGE LAP 4X18 X RAY DECT (DISPOSABLE) IMPLANT
STAPLER VISISTAT 35W (STAPLE) ×3 IMPLANT
SUCTION FRAZIER HANDLE 10FR (MISCELLANEOUS) ×2
SUCTION TUBE FRAZIER 10FR DISP (MISCELLANEOUS) ×1 IMPLANT
SUT MNCRL AB 4-0 PS2 18 (SUTURE) ×3 IMPLANT
SUT STRATAFIX 1PDS 45CM VIOLET (SUTURE) ×3 IMPLANT
SUT VIC AB 1 CT1 27 (SUTURE) ×8
SUT VIC AB 1 CT1 27XBRD ANBCTR (SUTURE) ×4 IMPLANT
SUT VIC AB 2-0 CT1 27 (SUTURE) ×4
SUT VIC AB 2-0 CT1 TAPERPNT 27 (SUTURE) ×2 IMPLANT
SUT VLOC 180 0 24IN GS25 (SUTURE) ×3 IMPLANT
TOWEL OR 17X24 6PK STRL BLUE (TOWEL DISPOSABLE) ×3 IMPLANT
TOWEL OR 17X26 10 PK STRL BLUE (TOWEL DISPOSABLE) ×3 IMPLANT
TRAY CATH 16FR W/PLASTIC CATH (SET/KITS/TRAYS/PACK) ×3 IMPLANT
TRAY FOLEY CATH 16FRSI W/METER (SET/KITS/TRAYS/PACK) IMPLANT
WATER STERILE IRR 1000ML POUR (IV SOLUTION) ×9 IMPLANT

## 2016-08-05 NOTE — Progress Notes (Signed)
PROGRESS NOTE    Isaac Villa  [id number] DOB: [birth year] DOA: 08/02/2016 PCP: [name], MD   Brief Narrative:  Isaac Villa is a 83 y.o. male with medical history significant of atrophic fibrillation, BPH, HLD, glaucoma, gout, HTN, melanoma. Of note patient may recently admitted for approximately 5 day stay due to symptomatic anemia secondary to right thigh hematoma which was sustained after a mechanical fall on anticoagulation with supratherapeutic INR. Patient had been discharged from the hospital for 24 hours prior to readmission. Patient reports ambulating across the tile floor in an effort to use the bathroom when his feet slipped from underneath him. Patient fell to the ground striking his left hip. Unsure if he struck his head but denies any specific focal pain complaints outside of his left hip, chest pain, palpitations, nausea, vomiting, shortness of breath, fevers, abdominal pain, dysuria, frequency, back pain. Pain in left hip is constant. Worse with movement. Admitted for Left Hip Fracture and transfused 2 units of pRBC's. Orthopedics took patient to surgery early this AM (08/05/16). Patient still with Delirium but able to be redirected.   Assessment & Plan:   Active Problems:   Chronic atrial fibrillation (HCC)   Essential hypertension, benign   Symptomatic anemia   Traumatic hematoma of buttock   Hip fracture (HCC)   BPH (benign prostatic hyperplasia)   Protein calorie malnutrition (HCC)   Protein-calorie malnutrition, severe  Acute Left Hip fracture s/p Left Total Hip Replacement POD 0 -sustained after mechanical fall. Patient is total hip arthroplasty w/ anterior approach on 08/05/16 by Dr. Alvan Dame.  - continue to hold coumadin (last dose 07/27/16); May consider transitioning to ASA 81 mg as an outpatient - S/p 2 units PRBC per ortho request - Foley per ortho request.  - Pain mgt per Ortho; Acetaminophen 1000 mg q8h, - Air overlay mattress - PT/OT after surgical  repair. Given pts frequent falls due to deconditioning and now hip fracture, CIR may be appropriate for pt once able. - Echo showed Definity used; normal LV function; mild AI; mildly dilated aortic   root (4.5 cm); suggest CTA or MRA to further assess; biatrial enlargement; mild RVE; mild TR; small pericardial effusion. -CTA can be done as an outpatient per Cardiology -ECHO shows no active CHF -SNF vs CIR at D/C  Hospital Delirium -Avoid Sedating Medications -Bed Hygiene -If worsens will consider scanning head -Delirum Precautions -Ordered MRI however will not be done as will be difficult to get patient on table because of broken hip -Continue with Redirection -Still Delirious  Leukocytosis -WBC went from 12.2 -> 21.6 -Likely reactive in the setting of Hip Surgery -Continue to Monitor S/Sx of Infection; If worsens Pan Culture and obtain CXR and repeat UA -Patient Afebrile  Anemia:  -7.7 on admission. Now improved to 10.6 -This was due to large Hematoma development in R hip secondary to previous mechanical fall and supratherapeutic INR. Baseline Hgb unclear. Normocytic.  - s/p 2 units PRBC per ortho; May consider giving another 2 doses peri-op/ intraoperatively - CBC in am  Afib: chronic. Rate controlled - continue Diltiazem, Digoxin - Coumadin restarted by Ortho - Cardiology not worried about pauses unless greater than 3 secs - Echo as above  HTN: - continue Diltiazem, HCTZ,  lisinopril in am when pressure improves.   BPH: - Continue flomax, Proscar  Acute gouty arthritis:  -stated on Prednisone during last admission for R knee swelling and pain. Improved w/ steroids. 5 day course set to finish on 08/04/16 - continue  allopurinol - Complete prednisone regimen.   Protein calorie malnutrition: - nutrition consult - Pre-albumin - feeding supplement  Sinus Pause -Pause was 2.25 seconds. Continue to Monitor for greater than 3 seconds -Will check Electrolytes and  repletes -Continue to Monitor closely; will hold Digoxin and Diltiazem if continues  Mildly Dilated Aortic Root -Discussed with Cardiology and CTA can be done as an outpatient  DVT prophylaxis: SCDs; Coumadin restarted by Ortho Code Status: FULL CODE Family Communication: Discussed with Daughters and Wife at bedside Disposition Plan: Likely SNF or CIR pending PT evaluation.  Consultants:   Orthopedic Surgery  Cardiology  Procedures: ECHOCARDIOGRAM Study Conclusions  - Left ventricle: The cavity size was normal. There was mild focal   basal hypertrophy of the septum. Systolic function was normal.   The estimated ejection fraction was in the range of 50% to 55%.   Wall motion was normal; there were no regional wall motion   abnormalities. - Ventricular septum: Septal motion showed abnormal function and   dyssynergy. - Aortic valve: There was mild regurgitation. - Aortic root: The aortic root was mildly dilated. - Mitral valve: Calcified annulus. - Left atrium: The atrium was severely dilated. - Right ventricle: The cavity size was mildly dilated. - Right atrium: The atrium was severely dilated. - Pulmonary arteries: PA peak pressure: 32 mm Hg (S). - Pericardium, extracardiac: A small pericardial effusion was   identified.  Impressions:  - Definity used; normal LV function; mild AI; mildly dilated aortic   root (4.5 cm); suggest CTA or MRA to further assess; biatrial   enlargement; mild RVE; mild TR; small pericardial effusion   Antimicrobials:  Anti-infectives    Start     Dose/Rate Route Frequency Ordered Stop   08/05/16 1900  vancomycin (VANCOCIN) IVPB 1000 mg/200 mL premix     1,000 mg 200 mL/hr over 60 Minutes Intravenous Every 12 hours 08/05/16 1128 08/06/16 0659   08/05/16 1000  vancomycin (VANCOCIN) IVPB 1000 mg/200 mL premix  Status:  Discontinued     1,000 mg 200 mL/hr over 60 Minutes Intravenous On call to O.R. 08/03/16 0627 08/05/16 1113   08/05/16  0600  vancomycin (VANCOCIN) IVPB 1000 mg/200 mL premix     1,000 mg 200 mL/hr over 60 Minutes Intravenous To ShortStay Surgical 08/04/16 2020 08/05/16 0825   08/05/16 0600  vancomycin (VANCOCIN) 1,500 mg in sodium chloride 0.9 % 500 mL IVPB  Status:  Discontinued     1,500 mg 250 mL/hr over 120 Minutes Intravenous On call to O.R. 08/04/16 2020 08/04/16 2024   08/05/16 0600  gentamicin (GARAMYCIN) 120 mg in dextrose 5 % 50 mL IVPB    Comments:  To be given in pre-operative with Vancomycin Single dose   1.5 mg/kg  78 kg 106 mL/hr over 30 Minutes Intravenous To ShortStay Surgical 08/04/16 2020 08/05/16 0917   08/03/16 0600  vancomycin (VANCOCIN) IVPB 1000 mg/200 mL premix  Status:  Discontinued     1,000 mg 200 mL/hr over 60 Minutes Intravenous On call to O.R. 08/02/16 1843 08/03/16 [id number]     Subjective: Seen at bedside and was still delirious but redirectable. Was very restless but per family did well in Surgery. No N/V. No other concerns or complaints.   Objective: Vitals:   08/05/16 1044 08/05/16 1059 08/05/16 1130 08/05/16 1421  BP: 130/68 133/74 (!) 155/66 (!) 145/69  Pulse: 82 89 91 74  Resp: (!) 27 20  16   Temp:    97.7 F (36.5 C)  TempSrc:  Oral  SpO2: 92% (!) 87% 93% 99%    Intake/Output Summary (Last 24 hours) at 08/05/16 1719 Last data filed at 08/05/16 1004  Gross per 24 hour  Intake             1675 ml  Output              725 ml  Net              950 ml   There were no vitals filed for this visit.  Examination: Physical Exam:  Constitutional:  NAD and appears calm and comfortable but pleasantly confused and delirious  Eyes: Lids and conjunctivae normal, sclerae anicteric  ENMT: External Ears, Nose appear normal. Grossly normal hearing.  Neck: Appears normal, supple, no cervical masses, normal ROM, no appreciable thyromegaly, no JVD Respiratory: Clear to auscultation bilaterally, no wheezing, rales, rhonchi or crackles. Normal respiratory effort and patient  is not tachypenic. No accessory muscle use.  Cardiovascular: RRR, no murmurs / rubs / gallops. S1 and S2 auscultated.  Abdomen: Soft, non-tender, non-distended. No masses palpated. No appreciable hepatosplenomegaly. Bowel sounds positive.  GU: Deferred. Musculoskeletal: No clubbing / cyanosis of digits/nails. Improved ROM in Lower Extremities.  Skin: Large ecchymosis/hematoma on right lower leg and buttocks. Neurologic: CN 2-12 grossly intact with no focal deficits. Romberg sign cerebellar reflexes not assessed.  Psychiatric: Impaired judgment and insight. Alert but not oriented x 3. Normal mood and appropriate affect.   Data Reviewed: I have personally reviewed following labs and imaging studies  CBC:  Recent Labs Lab 08/01/16 0852 08/02/16 1255 08/03/16 0510 08/04/16 0541 08/05/16 1127  WBC 10.3 17.0* 10.3 12.2* 21.6*  NEUTROABS  --  15.1*  --  10.4* 20.4*  HGB 7.8* 7.7* 9.1* 10.3* 10.6*  HCT 24.4* 24.0* 28.5* 32.9* 33.5*  MCV 93.1 93.0 90.5 92.4 93.6  PLT 195 246 212 228 [id number]   Basic Metabolic Panel:  Recent Labs Lab 08/02/16 1255 08/03/16 0510 08/03/16 1917 08/04/16 0541 08/05/16 1127  NA 136 142 139 139 139  K 4.0 3.6 4.2 4.3 4.2  CL 101 108 108 108 105  CO2 26 25 25 24 23   GLUCOSE 154* 93 103* 101* 131*  BUN 51* 37* 30* 24* 18  CREATININE 0.92 0.77 0.82 0.88 0.87  CALCIUM 8.5* 7.3* 8.1* 8.1* 8.2*  MG  --   --  2.0 2.0 1.9  PHOS  --   --  2.4* 2.2* 3.4   GFR: Estimated Creatinine Clearance: 63.5 mL/min (by C-G formula based on SCr of 0.87 mg/dL). Liver Function Tests:  Recent Labs Lab 08/03/16 0510 08/04/16 0541 08/05/16 1127  AST 28 23 24   ALT 18 17 17   ALKPHOS 44 58 59  BILITOT 1.8* 1.8* 2.3*  PROT 4.2* 4.9* 4.9*  ALBUMIN 1.9* 2.3* 2.3*   No results for input(s): LIPASE, AMYLASE in the last 168 hours. No results for input(s): AMMONIA in the last 168 hours. Coagulation Profile:  Recent Labs Lab 08/02/16 1255 08/03/16 0510  INR 1.18 1.28    Cardiac Enzymes: No results for input(s): CKTOTAL, CKMB, CKMBINDEX, TROPONINI in the last 168 hours. BNP (last 3 results) No results for input(s): PROBNP in the last 8760 hours. HbA1C: No results for input(s): HGBA1C in the last 72 hours. CBG:  Recent Labs Lab 07/31/16 1219 07/31/16 1731 07/31/16 2208 08/01/16 0738 08/01/16 1228  GLUCAP 145* 150* 137* 165* 183*   Lipid Profile: No results for input(s): CHOL, HDL, LDLCALC, TRIG, CHOLHDL, LDLDIRECT in the last  72 hours. Thyroid Function Tests: No results for input(s): TSH, T4TOTAL, FREET4, T3FREE, THYROIDAB in the last 72 hours. Anemia Panel: No results for input(s): VITAMINB12, FOLATE, FERRITIN, TIBC, IRON, RETICCTPCT in the last 72 hours. Sepsis Labs: No results for input(s): PROCALCITON, LATICACIDVEN in the last 168 hours.  Recent Results (from the past 240 hour(s))  Surgical PCR screen     Status: None   Collection Time: 08/03/16  4:00 AM  Result Value Ref Range Status   MRSA, PCR NEGATIVE NEGATIVE Final   Staphylococcus aureus NEGATIVE NEGATIVE Final    Comment:        The Xpert SA Assay (FDA approved for NASAL specimens in patients over 62 years of age), is one component of a comprehensive surveillance program.  Test performance has been validated by [facility] for patients greater than or equal to 44 year old. It is not intended to diagnose infection nor to guide or monitor treatment.   Culture, Urine     Status: Abnormal   Collection Time: 08/04/16 11:40 AM  Result Value Ref Range Status   Specimen Description URINE, RANDOM  Final   Special Requests NONE  Final   Culture <10,000 COLONIES/mL INSIGNIFICANT GROWTH (A)  Final   Report Status 08/05/2016 FINAL  Final    Radiology Studies: Dg C-arm 1-60 Min  Result Date: 08/05/2016 CLINICAL DATA:  Status post left total hip arthroplasty. EXAM: DG C-ARM 61-120 MIN; OPERATIVE LEFT HIP WITH PELVIS FLUOROSCOPY TIME:  23 seconds. COMPARISON:  Radiographs of  August 02, 2016. FINDINGS: The femoral and acetabular components appear to be well situated. No fracture or dislocation is noted. Expected postoperative changes are seen in the surrounding soft tissues. IMPRESSION: Status post left total hip arthroplasty. Electronically Signed   By: Marijo Conception, M.D.   On: 08/05/2016 09:54   Dg Hip Port Unilat With Pelvis 1v Left  Result Date: 08/05/2016 CLINICAL DATA:  Status post left hip replacement EXAM: DG HIP (WITH OR WITHOUT PELVIS) 1V PORT LEFT COMPARISON:  None. FINDINGS: The acetabular and femoral components of the left hip replacement are in good position. Soft tissue air is postoperative in nature. No other interval changes. IMPRESSION: Left hip replacement as above. Electronically Signed   By: Dorise Bullion III M.D   On: 08/05/2016 11:14   Dg Hip Operative Unilat W Or W/o Pelvis Left  Result Date: 08/05/2016 CLINICAL DATA:  Status post left total hip arthroplasty. EXAM: DG C-ARM 61-120 MIN; OPERATIVE LEFT HIP WITH PELVIS FLUOROSCOPY TIME:  23 seconds. COMPARISON:  Radiographs of August 02, 2016. FINDINGS: The femoral and acetabular components appear to be well situated. No fracture or dislocation is noted. Expected postoperative changes are seen in the surrounding soft tissues. IMPRESSION: Status post left total hip arthroplasty. Electronically Signed   By: Marijo Conception, M.D.   On: 08/05/2016 09:54   Scheduled Meds: . sodium chloride   Intravenous Once  . acetaminophen  1,000 mg Oral Q8H   Or  . acetaminophen  650 mg Rectal Q8H  . allopurinol  300 mg Oral Q1200  . digoxin  0.125 mg Oral Daily  . diltiazem  240 mg Oral Q1200  . docusate sodium  100 mg Oral BID  . ferrous sulfate  325 mg Oral TID PC  . finasteride  5 mg Oral QHS  . hydrochlorothiazide  25 mg Oral Daily  . lactose free nutrition  237 mL Oral TID BM  . latanoprost  1 drop Both Eyes QHS  .  magnesium oxide  400 mg Oral Q1200  . omega-3 acid ethyl esters  1 g Oral Q1200  .  phosphorus  500 mg Oral TID  . sodium chloride flush  3 mL Intravenous Q12H  . tamsulosin  0.4 mg Oral Daily  . traZODone  50 mg Oral QHS  . vancomycin  1,000 mg Intravenous Q12H  . warfarin  4 mg Oral ONCE-1800  . Warfarin - Pharmacist Dosing Inpatient   Does not apply q1800   Continuous Infusions:   LOS: 3 days   Kerney Elbe, DO [facility] Pager [phone]  If 7PM-7AM, please contact night-coverage [email or web address] Password [email or web address] 08/05/2016, 5:19 PM

## 2016-08-05 NOTE — Op Note (Signed)
NAME:  MICKI BIRDSONG                ACCOUNT NO.: [id number]      MEDICAL RECORD NO.: [id number]      FACILITY:  [facility]      PHYSICIAN:  Paralee Cancel D  DATE OF BIRTH:  Mar 29, 1925     DATE OF PROCEDURE:  08/05/2016                                 OPERATIVE REPORT         PREOPERATIVE DIAGNOSIS: Left  hip osteoarthritis.      POSTOPERATIVE DIAGNOSIS:  Left hip osteoarthritis.      PROCEDURE:  Left total hip replacement through an anterior approach   utilizing DePuy THR system, component size 72mm pinnacle cup, a size 36+4 neutral   Altrex liner, a size 8 Hi Tri Lock stem with a 36+5 Articuleze head ball.      SURGEON:  Pietro Cassis. Alvan Dame, M.D.      ASSISTANT:  Danae Orleans, PA-C     ANESTHESIA:  General and Spinal.      SPECIMENS:  None.      COMPLICATIONS:  None.      BLOOD LOSS:  500 cc     DRAINS:  None.      INDICATION OF THE PROCEDURE:  Isaac Villa is a 83 y.o. male who fell while at a nursing facility.  He landed on his left hip and sustained a displaced left femoral neck fracture. He was brought to [facility] and admitted to the medical service per protocol.  Once medically optimized he was scheduled for surgery.  I reviewed with family the options for repair and it was decided/elected to proceed with a total hip replacement versus a hemiarthroplasty.  Consent was obtained for   benefit of pain relief.  Specific risk of infection, DVT, component   failure, dislocation, need for revision surgery, as well discussion of   the anterior versus posterior approach were reviewed.  Consent was   obtained for benefit of anterior pain relief through an anterior   approach.      PROCEDURE IN DETAIL:  The patient was brought to operative theater.   Once adequate anesthesia, preoperative antibiotics, 1gm of Vancomycin, gentamycin administered.   The patient was positioned supine on the OSI Hanna table.  Once adequate   padding of boney process was  carried out, we had predraped out the hip, and  used fluoroscopy to confirm orientation of the pelvis and position.      The left hip was then prepped and draped from proximal iliac crest to   mid thigh with shower curtain technique.      Time-out was performed identifying the patient, planned procedure, and   extremity.     An incision was then made 2 cm distal and lateral to the   anterior superior iliac spine extending over the orientation of the   tensor fascia lata muscle and sharp dissection was carried down to the   fascia of the muscle and protractor placed in the soft tissues.      The fascia was then incised.  The muscle belly was identified and swept   laterally and retractor placed along the superior neck.  Following   cauterization of the circumflex vessels and removing some pericapsular   fat, a second cobra retractor was placed on  the inferior neck.  A third   retractor was placed on the anterior acetabulum after elevating the   anterior rectus.  A L-capsulotomy was along the line of the   superior neck to the trochanteric fossa, then extended proximally and   distally.  Tag sutures were placed and the retractors were then placed   intracapsular.  We then identified the trochanteric fossa and   orientation of my neck cut, confirmed this radiographically   and then made a neck osteotomy with the femur on traction.  The femoral   head was removed without difficulty or complication.  Traction was let   off and retractors were placed posterior and anterior around the   acetabulum.      The labrum and foveal tissue were debrided.  I began reaming with a 69mm   reamer and reamed up to 62mm reamer with good bony bed preparation and a 54mm   cup was chosen.  The final 51mm Pinnacle cup was then impacted under fluoroscopy  to confirm the depth of penetration and orientation with respect to   abduction.  2 cancellous screws was placed followed by the hole eliminator.  The final    36+4 neutral Altrex liner was impacted with good visualized rim fit.  The cup was positioned anatomically within the acetabular portion of the pelvis.      At this point, the femur was rolled at 80 degrees.  Further capsule was   released off the inferior aspect of the femoral neck.  I then   released the superior capsule proximally.  The hook was placed laterally   along the femur and elevated manually and held in position with the bed   hook.  The leg was then extended and adducted with the leg rolled to 100   degrees of external rotation.  Once the proximal femur was fully   exposed, I used a box osteotome to set orientation.  I then began   broaching with the starting chili pepper broach and passed this by hand and then broached up to 8.  With the 8 broach in place I chose a high offset neck and did several trial reductions.  The offset was appropriate, leg lengths   appeared to be equal confirmed radiographically.   Given these findings, I went ahead and dislocated the hip, repositioned all   retractors and positioned the right hip in the extended and abducted position.  The final 8 Hi Tri Lock stem was   chosen and it was impacted down to the level of neck cut.  Based on this   and the trial reduction, a 36+5 Articuleze head ball was chosen and   impacted onto a clean and dry trunnion, and the hip was reduced.  The   hip had been irrigated throughout the case again at this point.  I did   reapproximate the superior capsular leaflet to the anterior leaflet   using #1 Vicryl.  The fascia of the   tensor fascia lata muscle was then reapproximated using #1 Vicryl and #1 Stratafix sutures.  The   remaining wound was closed with 2-0 Vicryl and running 4-0 Monocryl.   The hip was cleaned, dried, and dressed sterilely using Dermabond and   Aquacel dressing.  He was then brought   to recovery room in stable condition tolerating the procedure well.    Danae Orleans, PA-C was present for the  entirety of the case involved from   preoperative positioning, perioperative retractor management,  general   facilitation of the case, as well as primary wound closure as assistant.            Pietro Cassis Alvan Dame, M.D.        08/05/2016 9:42 AM

## 2016-08-05 NOTE — Progress Notes (Signed)
ANTICOAGULATION CONSULT NOTE - Initial Consult  Pharmacy Consult for Coumadin Indication: atrial fibrillation  Allergies  Allergen Reactions  . Penicillins Anaphylaxis and Other (See Comments)    SYNCOPE > "Causes pt to pass out "  Has patient had a PCN reaction causing immediate rash, facial/tongue/throat swelling, SOB or lightheadedness with hypotension:  #  #  #  YES  #  #  #  Has patient had a PCN reaction causing severe rash involving mucus membranes or skin necrosis: No Has patient had a PCN reaction that required hospitalization #  #  #  YES  #  #  #  Has patient had a PCN reaction occurring within the last 10 years: No  . Enoxaparin Other (See Comments)    REACTION IS DOSE RELATED >>"Internal bleeding"    Patient Measurements: TBW 78 kg  Vital Signs: Temp: 97.7 F (36.5 C) (02/03 1014) Temp Source: Oral (02/03 0454) BP: 133/74 (02/03 1059) Pulse Rate: 89 (02/03 1059)  Assessment: 83 yo M presents after fall. Now s/p left hip replacement to restart Coumadin. INR was 1.28 on 2/1. Coumadin 4mg  PO daily PTA. Hgb 10.3, plts wnl. No s/s of bleed.  Goal of Therapy:  INR 2-3 Monitor platelets by anticoagulation protocol: Yes   Plan:  Give Coumadin 4mg  PO x 1  Monitor daily INR, CBC, s/s of bleed  Elenor Quinones, PharmD, [facility] Clinical Pharmacist Pager [phone] 08/05/2016 11:35 AM

## 2016-08-05 NOTE — Anesthesia Procedure Notes (Addendum)
Spinal  Patient location during procedure: OR Start time: 08/05/2016 7:50 AM End time: 08/05/2016 7:53 AM Staffing Anesthesiologist: Nolon Nations Performed: anesthesiologist  Preanesthetic Checklist Completed: patient identified, site marked, surgical consent, pre-op evaluation, timeout performed, IV checked, risks and benefits discussed and monitors and equipment checked Spinal Block Patient position: left lateral decubitus Prep: ChloraPrep Patient monitoring: heart rate, continuous pulse ox and blood pressure Approach: right paramedian Location: L2-3 Injection technique: single-shot Needle Needle type: Sprotte  Needle gauge: 24 G Needle length: 9 cm Additional Notes Expiration date of kit checked and confirmed. Patient tolerated procedure well, without complications.

## 2016-08-05 NOTE — Anesthesia Procedure Notes (Signed)
Procedure Name: LMA Insertion Date/Time: 08/05/2016 8:40 AM Performed by: Claris Che Oxygen Delivery Method: Circle system utilized Preoxygenation: Pre-oxygenation with 100% oxygen Intubation Type: IV induction Ventilation: Mask ventilation without difficulty LMA: LMA inserted LMA Size: 4.0 Number of attempts: 1 Placement Confirmation: positive ETCO2 and breath sounds checked- equal and bilateral Tube secured with: Tape Dental Injury: Teeth and Oropharynx as per pre-operative assessment

## 2016-08-05 NOTE — Care Management Note (Signed)
Case Management Note  Patient Details  Name: Isaac Villa MRN: [id number] Date of Birth: September 22, 1924  Subjective/Objective:    83 year old male s/p left total hip arthroplasty.                 Action/Plan:  Case manager will follow for therapy recommendations.    Expected Discharge Date:     Pending             Expected Discharge Plan:     In-House Referral:     Discharge planning Services     Post Acute Care Choice:    Choice offered to:     DME Arranged:    DME Agency:     HH Arranged:    HH Agency:     Status of Service:     If discussed at [employer], dates discussed:    Additional Comments:  Ninfa Meeker, RN 08/05/2016, 12:23 PM

## 2016-08-05 NOTE — Anesthesia Procedure Notes (Signed)
Procedure Name: MAC Date/Time: 08/05/2016 7:45 AM Performed by: Claris Che Pre-anesthesia Checklist: Patient identified, Emergency Drugs available, Suction available, Patient being monitored and Timeout performed Oxygen Delivery Method: Simple face mask

## 2016-08-05 NOTE — Anesthesia Postprocedure Evaluation (Signed)
Anesthesia Post Note  Patient: Isaac Villa  Procedure(s) Performed: Procedure(s) (LRB): HEMI vs TOTAL HIP REPLACEMENT ANTERIOR APPROACH (Left)  Patient location during evaluation: PACU Anesthesia Type: Spinal and General Level of consciousness: sedated and patient cooperative Pain management: pain level controlled Vital Signs Assessment: post-procedure vital signs reviewed and stable Respiratory status: spontaneous breathing Cardiovascular status: stable Anesthetic complications: no       Last Vitals:  Vitals:   08/05/16 1059 08/05/16 1130  BP: 133/74 (!) 155/66  Pulse: 89 91  Resp: 20   Temp:      Last Pain:  Vitals:   08/05/16 1045  TempSrc:   PainSc: [place]

## 2016-08-05 NOTE — Plan of Care (Signed)
Problem: Safety: Goal: Ability to remain free from injury will improve Outcome: Progressing Sitter at bedside  Problem: Pain Managment: Goal: General experience of comfort will improve Outcome: Progressing On Shedule Tylenol  Problem: Skin Integrity: Goal: Risk for impaired skin integrity will decrease Outcome: Progressing Ecchymosis noted from right flank to right knee  Problem: Bowel/Gastric: Goal: Will not experience complications related to bowel motility Outcome: Progressing No gastric or bowel issues noted  Problem: Nutrition: Goal: Ability to attain and maintain optimal nutritional status will improve Outcome: Progressing Taking Po well  Problem: Physical Regulation: Goal: Will remain free from infection Outcome: Progressing No S/S of infection noted

## 2016-08-05 NOTE — Progress Notes (Signed)
Patient seen and examined Friday afternoon with full and complete discussion with family about his options I had reviewed his case since admission and initial consult with Dr. Gladstone Lighter Currently off his coumadin with a normalized INR His Hgb is up to 10 from 7 after transfusion  After discussing with family surgical options they wish to proceed with a total hip arthroplasty versus hemiarthroplasty siting concerns with post surgical pain that could potentially hamper his activity level noted to be high despite age.  I specifically reviewed risks of as well as pros and cons of hemi versus total hip Consent ordered and his wife to sign due to some acute dilerium NPO after MN Vancomycin due to PCN allergy plus Gentamycin for added gram negative coverage Resume coumadin post-operatively Cardiology has been involved as well and feel surgery can proceed

## 2016-08-05 NOTE — Transfer of Care (Signed)
Immediate Anesthesia Transfer of Care Note  Patient: Isaac Villa  Procedure(s) Performed: Procedure(s): HEMI vs TOTAL HIP REPLACEMENT ANTERIOR APPROACH (Left)  Patient Location: PACU  Anesthesia Type:General and Spinal  Level of Consciousness: sedated, patient cooperative and responds to stimulation  Airway & Oxygen Therapy: Patient Spontanous Breathing and Patient connected to nasal cannula oxygen  Post-op Assessment: Report given to RN, Post -op Vital signs reviewed and stable and Moving arms  SAB in place  Post vital signs: Reviewed and stable  Last Vitals:  Vitals:   08/05/16 0454 08/05/16 1014  BP: 139/84   Pulse: 83   Resp: 18   Temp: 36.8 C (P) 36.5 C    Last Pain:  Vitals:   08/05/16 0454  TempSrc: [place]  PainSc:       Patients Stated Pain Goal: 0 ([phone] [id number])  Complications: No apparent anesthesia complications

## 2016-08-06 ENCOUNTER — Inpatient Hospital Stay (HOSPITAL_COMMUNITY): Payer: Medicare Other

## 2016-08-06 DIAGNOSIS — N400 Enlarged prostate without lower urinary tract symptoms: Secondary | ICD-10-CM

## 2016-08-06 DIAGNOSIS — S300XXD Contusion of lower back and pelvis, subsequent encounter: Secondary | ICD-10-CM

## 2016-08-06 DIAGNOSIS — I482 Chronic atrial fibrillation: Secondary | ICD-10-CM

## 2016-08-06 DIAGNOSIS — I10 Essential (primary) hypertension: Secondary | ICD-10-CM

## 2016-08-06 DIAGNOSIS — S72002P Fracture of unspecified part of neck of left femur, subsequent encounter for closed fracture with malunion: Secondary | ICD-10-CM

## 2016-08-06 LAB — CBC
HCT: 29.3 % — ABNORMAL LOW (ref 39.0–52.0)
Hemoglobin: 9.3 g/dL — ABNORMAL LOW (ref 13.0–17.0)
MCH: 29.2 pg (ref 26.0–34.0)
MCHC: 31.7 g/dL (ref 30.0–36.0)
MCV: 92.1 fL (ref 78.0–100.0)
PLATELETS: 227 10*3/uL (ref 150–400)
RBC: 3.18 MIL/uL — ABNORMAL LOW (ref 4.22–5.81)
RDW: 16.7 % — ABNORMAL HIGH (ref 11.5–15.5)
WBC: 16.8 10*3/uL — AB (ref 4.0–10.5)

## 2016-08-06 LAB — BASIC METABOLIC PANEL
Anion gap: 7 (ref 5–15)
BUN: 25 mg/dL — ABNORMAL HIGH (ref 6–20)
CALCIUM: 7.9 mg/dL — AB (ref 8.9–10.3)
CO2: 23 mmol/L (ref 22–32)
Chloride: 110 mmol/L (ref 101–111)
Creatinine, Ser: 0.92 mg/dL (ref 0.61–1.24)
GFR calc Af Amer: >60 mL/min (ref >60)
GLUCOSE: 148 mg/dL — AB (ref 65–99)
Potassium: 4.2 mmol/L (ref 3.5–5.1)
SODIUM: 140 mmol/L (ref 135–145)

## 2016-08-06 LAB — PROTIME-INR
INR: 1.2
Prothrombin Time: 15.3 seconds — ABNORMAL HIGH (ref 11.4–15.2)

## 2016-08-06 LAB — PHOSPHORUS: Phosphorus: 3.8 mg/dL (ref 2.5–4.6)

## 2016-08-06 LAB — MAGNESIUM: Magnesium: 2 mg/dL (ref 1.7–2.4)

## 2016-08-06 MED ORDER — ASPIRIN EC 81 MG PO TBEC
81.0000 mg | DELAYED_RELEASE_TABLET | Freq: Every day | ORAL | Status: DC
  Administered 2016-08-07 – 2016-08-08 (×2): 81 mg via ORAL
  Filled 2016-08-06 (×2): qty 1

## 2016-08-06 MED ORDER — WARFARIN SODIUM 4 MG PO TABS
4.0000 mg | ORAL_TABLET | Freq: Once | ORAL | Status: AC
  Administered 2016-08-06: 4 mg via ORAL
  Filled 2016-08-06: qty 1

## 2016-08-06 NOTE — Progress Notes (Signed)
Patient ID: Isaac Villa, male   DOB: 1925-02-15, 83 y.o.   MRN: [id number] Subjective: 1 Day Post-Op Procedure(s) (LRB): TOTAL HIP REPLACEMENT ANTERIOR APPROACH (Left)    Patient finally getting some sleep after receiving some Trazadone apparently.  Daughters in room Chart and events reviewed  Objective:   VITALS:   Vitals:   08/05/16 1421 08/05/16 2103  BP: (!) 145/69 132/61  Pulse: 74 92  Resp: 16 18  Temp: 97.7 F (36.5 C) 99.4 F (37.4 C)    Incision: dressing C/D/I  Did not wake this am to assess neuro status due to significant disruption in his sleep pattern  LABS  Recent Labs  08/04/16 0541 08/05/16 1127 08/06/16 0254  HGB 10.3* 10.6* 9.3*  HCT 32.9* 33.5* 29.3*  WBC 12.2* 21.6* 16.8*  PLT 228 224 227     Recent Labs  08/04/16 0541 08/05/16 1127 08/06/16 0254  NA 139 139 140  K 4.3 4.2 4.2  BUN 24* 18 25*  CREATININE 0.88 0.87 0.92  GLUCOSE 101* 131* 148*     Recent Labs  08/06/16 0254  INR 1.20     Assessment/Plan: 1 Day Post-Op Procedure(s) (LRB): HEMI vs TOTAL HIP REPLACEMENT ANTERIOR APPROACH (Left)   Up with therapy if able to cooperate, WBAT LLE Continue tylenol only for pain to allow brain to recover from events of past few weeks Coumadin for DVT prophylaxis and chromic a-fib

## 2016-08-06 NOTE — Progress Notes (Addendum)
PROGRESS NOTE    Isaac Villa  [id number] DOB: [birth year] DOA: 08/02/2016 PCP: [name], MD   Brief Narrative:  Isaac Villa is a 83 y.o. male with medical history significant of atrophic fibrillation, BPH, HLD, glaucoma, gout, HTN, melanoma. Of note patient may recently admitted for approximately 5 day stay due to symptomatic anemia secondary to right thigh hematoma which was sustained after a mechanical fall on anticoagulation with supratherapeutic INR. Patient had been discharged from the hospital for 24 hours prior to readmission. Patient reports ambulating across the tile floor in an effort to use the bathroom when his feet slipped from underneath him. Patient fell to the ground striking his left hip. Unsure if he struck his head but denies any specific focal pain complaints outside of his left hip, chest pain, palpitations, nausea, vomiting, shortness of breath, fevers, abdominal pain, dysuria, frequency, back pain. Pain in left hip is constant. Worse with movement. Admitted for Left Hip Fracture and transfused 2 units of pRBC's. Orthopedics took patient to surgery early this AM (08/05/16). Patient still with Delirium but able to be redirected and not as bad today as he was able to get some sleep. MRI of brain was done and showed Hygroma so Neurosurgery was consulted and was recommended to stop Coumadin and start ASA 81. Dr. Ellene Route to see patient in Am.   Assessment & Plan:   Active Problems:   Chronic atrial fibrillation (HCC)   Essential hypertension, benign   Symptomatic anemia   Traumatic hematoma of buttock   Hip fracture (HCC)   BPH (benign prostatic hyperplasia)   Protein calorie malnutrition (HCC)   Protein-calorie malnutrition, severe  Acute Left Hip fracture s/p Left Total Hip Replacement POD 1 -sustained after mechanical fall. Patient is total hip arthroplasty w/ anterior approach on 08/05/16 by Dr. Alvan Dame.  - Discontinued Coumadin and transitioned to ASA 81 mg per  Neurosurgery Recc's - S/p 2 units PRBC per ortho request - Foley per ortho request. Will likely D/C - Pain mgt per Ortho; Acetaminophen 1000 mg q8h - Air overlay mattress - PT/OT after surgical repair. Given pts frequent falls due to deconditioning and now hip fracture, CIR may be appropriate for pt once able. -Dr. Alysia Penna evaluated and maybe a candidate for CIR vs. SNF depending on ability to tolerate therapy - Echo showed Definity used; normal LV function; mild AI; mildly dilated aortic root (4.5 cm); suggest CTA or MRA to further assess; biatrial enlargement; mild RVE; mild TR; small pericardial effusion. -CTA can be done as an outpatient per Cardiology -ECHO shows no active CHF -SNF vs CIR at D/C; Dr. Letta Pate from [place] evaluated today -Per Ortho Up with Therapy if able to cooperate; [facility] Delirium, improved -Avoid Sedating Medications -Bed Hygiene -If worsens will consider scanning head -Delirum Precautions -MRI showed New subdural collection around the left cerebral convexity measuring up to 10 mm in thickness, signal most consistent with hygroma. Left cerebral mass effect is mild. CT follow-up is recommended. No postoperative infarct. IIncidental tectal lipoma. -DISCUSSED With Neurosurgeon Dr. Marcine Matar who recommended stopping Coumadin and starting on ASA 81 mg.  -Continue with Redirection -Still Delirious but not as much after getting sleep last night   Leukocytosis -WBC went from 12.2 -> 21.6 -> 16.8 -Likely reactive in the setting of Hip Surgery -Continue to Monitor S/Sx of Infection; If worsens Pan Culture and obtain CXR and repeat UA -Patient Afebrile  Anemia:  -7.7 on admission. Now 9.3 likely post operative chaneg -This  was due to large Hematoma development in R hip secondary to previous mechanical fall and supratherapeutic INR. Baseline Hgb unclear. Normocytic.  -s/p 2 units PRBC per ortho; -C/w Ferrous Sulfate 325 mg po TID -CBC in am  Afib:  chronic. Rate controlled - continue Diltiazem, Digoxin - Coumadin restarted by Ortho but now D/C'd per Neurosurgery Recc's. Started ASA 81 mg now - Cardiology not worried about pauses unless greater than 3 secs - Echo as above  HTN: - continue Diltiazem, HCTZ,  lisinopril in am when pressure improves.   BPH: - Continue Finasteride 5 mg po Daily and with Tamsulosisn 0.4 mg po Daily  Acute gouty arthritis:  -stated on Prednisone during last admission for R knee swelling and pain.  -Improved w/ steroids. 5 day course finished on 08/04/16 -Continue Allopurinol 300 mg po Daily  Protein calorie malnutrition: - nutrition consult - Pre-albumin - feeding supplement with Boost Plus 237 mL po TIDwm  Sinus Pause -Pause was 2.25 seconds. Continue to Monitor for greater than 3 seconds -Will check Electrolytes and repletes -Continue to Monitor closely; will hold Digoxin and Diltiazem if continues  Mildly Dilated Aortic Root -Discussed with Cardiology and CTA can be done as an outpatient  DVT prophylaxis: SCDs; Coumadin restarted by Ortho but now D/C'd; Will need to discuss long term Anticoagulation. Discussed with Neurosurgery Dr. Marcine Matar Code Status: FULL CODE Family Communication: Discussed with Daughters and Wife at bedside Disposition Plan: Likely SNF or CIR   Consultants:   Orthopedic Surgery  Cardiology  CIR  Neurosurgery  Procedures: ECHOCARDIOGRAM Study Conclusions  - Left ventricle: The cavity size was normal. There was mild focal   basal hypertrophy of the septum. Systolic function was normal.   The estimated ejection fraction was in the range of 50% to 55%.   Wall motion was normal; there were no regional wall motion   abnormalities. - Ventricular septum: Septal motion showed abnormal function and   dyssynergy. - Aortic valve: There was mild regurgitation. - Aortic root: The aortic root was mildly dilated. - Mitral valve: Calcified annulus. - Left atrium: The  atrium was severely dilated. - Right ventricle: The cavity size was mildly dilated. - Right atrium: The atrium was severely dilated. - Pulmonary arteries: PA peak pressure: 32 mm Hg (S). - Pericardium, extracardiac: A small pericardial effusion was   identified.  Impressions:  - Definity used; normal LV function; mild AI; mildly dilated aortic   root (4.5 cm); suggest CTA or MRA to further assess; biatrial   enlargement; mild RVE; mild TR; small pericardial effusion   Antimicrobials:  Anti-infectives    Start     Dose/Rate Route Frequency Ordered Stop   08/05/16 1900  vancomycin (VANCOCIN) IVPB 1000 mg/200 mL premix     1,000 mg 200 mL/hr over 60 Minutes Intravenous Every 12 hours 08/05/16 1128 08/05/16 2147   08/05/16 1000  vancomycin (VANCOCIN) IVPB 1000 mg/200 mL premix  Status:  Discontinued     1,000 mg 200 mL/hr over 60 Minutes Intravenous On call to O.R. 08/03/16 0627 08/05/16 1113   08/05/16 0600  vancomycin (VANCOCIN) IVPB 1000 mg/200 mL premix     1,000 mg 200 mL/hr over 60 Minutes Intravenous To ShortStay Surgical 08/04/16 2020 08/05/16 0825   08/05/16 0600  vancomycin (VANCOCIN) 1,500 mg in sodium chloride 0.9 % 500 mL IVPB  Status:  Discontinued     1,500 mg 250 mL/hr over 120 Minutes Intravenous On call to O.R. 08/04/16 2020 08/04/16 2024   08/05/16 0600  gentamicin (GARAMYCIN) 120 mg in dextrose 5 % 50 mL IVPB    Comments:  To be given in pre-operative with Vancomycin Single dose   1.5 mg/kg  78 kg 106 mL/hr over 30 Minutes Intravenous To ShortStay Surgical 08/04/16 2020 08/05/16 0917   08/03/16 0600  vancomycin (VANCOCIN) IVPB 1000 mg/200 mL premix  Status:  Discontinued     1,000 mg 200 mL/hr over 60 Minutes Intravenous On call to O.R. 08/02/16 1843 08/03/16 ED:[id number]     Subjective: Seen at bedside and was better today after getting some rest but still a little delirious. No Nausea or Vomiting. Did not complain of pain. To work with therapy.    Objective: Vitals:   08/06/16 0200 08/06/16 0400 08/06/16 0600 08/06/16 1434  BP:    121/64  Pulse:    76  Resp:      Temp:    98 F (36.7 C)  TempSrc:    Oral  SpO2: 90% 90% 90% 93%    Intake/Output Summary (Last 24 hours) at 08/06/16 1455 Last data filed at 08/06/16 0600  Gross per 24 hour  Intake              850 ml  Output                0 ml  Net              850 ml   There were no vitals filed for this visit.  Examination: Physical Exam:  Constitutional:  NAD and appears calm and comfortable; slightly confused Eyes: Lids and conjunctivae normal, sclerae anicteric  ENMT: External Ears, Nose appear normal. Grossly normal hearing.  Neck: Appears normal, supple, no cervical masses, normal ROM, no appreciable thyromegaly, no JVD Respiratory: Clear to auscultation bilaterally, no wheezing, rales, rhonchi or crackles. Normal respiratory effort and patient is not tachypenic. No accessory muscle use.  Cardiovascular: RRR, no murmurs / rubs / gallops. S1 and S2 auscultated.  Abdomen: Soft, non-tender, non-distended. No masses palpated. No appreciable hepatosplenomegaly. Bowel sounds positive.  GU: Deferred. Musculoskeletal: No clubbing / cyanosis of digits/nails. Improved ROM in Lower Extremities.  Skin: Large ecchymosis/hematoma on right lower leg and buttocks. Neurologic: CN 2-12 grossly intact with no focal deficits. Romberg sign cerebellar reflexes not assessed.  Psychiatric: Impaired judgment and insight. Alert and oriented x 2. Normal mood and appropriate affect.   Data Reviewed: I have personally reviewed following labs and imaging studies  CBC:  Recent Labs Lab 08/02/16 1255 08/03/16 0510 08/04/16 0541 08/05/16 1127 08/06/16 0254  WBC 17.0* 10.3 12.2* 21.6* 16.8*  NEUTROABS 15.1*  --  10.4* 20.4*  --   HGB 7.7* 9.1* 10.3* 10.6* 9.3*  HCT 24.0* 28.5* 32.9* 33.5* 29.3*  MCV 93.0 90.5 92.4 93.6 92.1  PLT 246 212 228 224 [id number]   Basic Metabolic  Panel:  Recent Labs Lab 08/03/16 0510 08/03/16 1917 08/04/16 0541 08/05/16 1127 08/06/16 0254  NA 142 139 139 139 140  K 3.6 4.2 4.3 4.2 4.2  CL 108 108 108 105 110  CO2 25 25 24 23 23   GLUCOSE 93 103* 101* 131* 148*  BUN 37* 30* 24* 18 25*  CREATININE 0.77 0.82 0.88 0.87 0.92  CALCIUM 7.3* 8.1* 8.1* 8.2* 7.9*  MG  --  2.0 2.0 1.9 2.0  PHOS  --  2.4* 2.2* 3.4 3.8   GFR: Estimated Creatinine Clearance: 60.1 mL/min (by C-G formula based on SCr of 0.92 mg/dL). Liver Function Tests:  Recent Labs Lab  08/03/16 0510 08/04/16 0541 08/05/16 1127  AST 28 23 24   ALT 18 17 17   ALKPHOS 44 58 59  BILITOT 1.8* 1.8* 2.3*  PROT 4.2* 4.9* 4.9*  ALBUMIN 1.9* 2.3* 2.3*   No results for input(s): LIPASE, AMYLASE in the last 168 hours. No results for input(s): AMMONIA in the last 168 hours. Coagulation Profile:  Recent Labs Lab 08/02/16 1255 08/03/16 0510 08/06/16 0254  INR 1.18 1.28 1.20   Cardiac Enzymes: No results for input(s): CKTOTAL, CKMB, CKMBINDEX, TROPONINI in the last 168 hours. BNP (last 3 results) No results for input(s): PROBNP in the last 8760 hours. HbA1C: No results for input(s): HGBA1C in the last 72 hours. CBG:  Recent Labs Lab 07/31/16 1219 07/31/16 1731 07/31/16 2208 08/01/16 0738 08/01/16 1228  GLUCAP 145* 150* 137* 165* 183*   Lipid Profile: No results for input(s): CHOL, HDL, LDLCALC, TRIG, CHOLHDL, LDLDIRECT in the last 72 hours. Thyroid Function Tests: No results for input(s): TSH, T4TOTAL, FREET4, T3FREE, THYROIDAB in the last 72 hours. Anemia Panel: No results for input(s): VITAMINB12, FOLATE, FERRITIN, TIBC, IRON, RETICCTPCT in the last 72 hours. Sepsis Labs: No results for input(s): PROCALCITON, LATICACIDVEN in the last 168 hours.  Recent Results (from the past 240 hour(s))  Surgical PCR screen     Status: None   Collection Time: 08/03/16  4:00 AM  Result Value Ref Range Status   MRSA, PCR NEGATIVE NEGATIVE Final   Staphylococcus  aureus NEGATIVE NEGATIVE Final    Comment:        The Xpert SA Assay (FDA approved for NASAL specimens in patients over 47 years of age), is one component of a comprehensive surveillance program.  Test performance has been validated by [facility] for patients greater than or equal to 14 year old. It is not intended to diagnose infection nor to guide or monitor treatment.   Culture, Urine     Status: Abnormal   Collection Time: 08/04/16 11:40 AM  Result Value Ref Range Status   Specimen Description URINE, RANDOM  Final   Special Requests NONE  Final   Culture <10,000 COLONIES/mL INSIGNIFICANT GROWTH (A)  Final   Report Status 08/05/2016 FINAL  Final    Radiology Studies: Dg C-arm 1-60 Min  Result Date: 08/05/2016 CLINICAL DATA:  Status post left total hip arthroplasty. EXAM: DG C-ARM 61-120 MIN; OPERATIVE LEFT HIP WITH PELVIS FLUOROSCOPY TIME:  23 seconds. COMPARISON:  Radiographs of August 02, 2016. FINDINGS: The femoral and acetabular components appear to be well situated. No fracture or dislocation is noted. Expected postoperative changes are seen in the surrounding soft tissues. IMPRESSION: Status post left total hip arthroplasty. Electronically Signed   By: Marijo Conception, M.D.   On: 08/05/2016 09:54   Dg Hip Port Unilat With Pelvis 1v Left  Result Date: 08/05/2016 CLINICAL DATA:  Status post left hip replacement EXAM: DG HIP (WITH OR WITHOUT PELVIS) 1V PORT LEFT COMPARISON:  None. FINDINGS: The acetabular and femoral components of the left hip replacement are in good position. Soft tissue air is postoperative in nature. No other interval changes. IMPRESSION: Left hip replacement as above. Electronically Signed   By: Dorise Bullion III M.D   On: 08/05/2016 11:14   Dg Hip Operative Unilat W Or W/o Pelvis Left  Result Date: 08/05/2016 CLINICAL DATA:  Status post left total hip arthroplasty. EXAM: DG C-ARM 61-120 MIN; OPERATIVE LEFT HIP WITH PELVIS FLUOROSCOPY TIME:  23 seconds.  COMPARISON:  Radiographs of August 02, 2016. FINDINGS: The  femoral and acetabular components appear to be well situated. No fracture or dislocation is noted. Expected postoperative changes are seen in the surrounding soft tissues. IMPRESSION: Status post left total hip arthroplasty. Electronically Signed   By: Marijo Conception, M.D.   On: 08/05/2016 09:54   Scheduled Meds: . sodium chloride   Intravenous Once  . acetaminophen  1,000 mg Oral Q8H   Or  . acetaminophen  650 mg Rectal Q8H  . allopurinol  300 mg Oral Q1200  . digoxin  0.125 mg Oral Daily  . diltiazem  240 mg Oral Q1200  . docusate sodium  100 mg Oral BID  . ferrous sulfate  325 mg Oral TID PC  . finasteride  5 mg Oral QHS  . hydrochlorothiazide  25 mg Oral Daily  . lactose free nutrition  237 mL Oral TID BM  . latanoprost  1 drop Both Eyes QHS  . magnesium oxide  400 mg Oral Q1200  . omega-3 acid ethyl esters  1 g Oral Q1200  . phosphorus  500 mg Oral TID  . sodium chloride flush  3 mL Intravenous Q12H  . tamsulosin  0.4 mg Oral Daily  . warfarin  4 mg Oral ONCE-1800  . Warfarin - Pharmacist Dosing Inpatient   Does not apply q1800   Continuous Infusions:   LOS: 4 days   Kerney Elbe, DO [facility] Pager [phone]  If 7PM-7AM, please contact night-coverage [email or web address] Password [email or web address] 08/06/2016, 2:55 PM

## 2016-08-06 NOTE — Evaluation (Signed)
Occupational Therapy Evaluation Patient Details Name: Isaac Villa MRN: [id number] DOB: 08/02/1924 Today's Date: 08/06/2016    History of Present Illness This 83 y.o. male admitted after slipping on tile floor at SNF (for rehab) while ambulating to BR.  He sustained Lt femur fx, and underwent THA direct anterior approach.   PMH includes recent falls.  He fell on ice and sustained hematoma, and was found to have supratherapeutic INR while on coumadin, he fell again at home due to symptomatic anemia (was discharged to SNF after thad recent admission); glaucoma, A-Fib, glaucoma, gout, HTN, melanoma    Clinical Impression   Pt admitted with above. He demonstrates the below listed deficits and will benefit from continued OT to maximize safety and independence with BADLs.  Pt presents to OT with generalized weakness, pain, impaired cognition due to delerium thought to be secondary to meds, and impaired balance.   He currently requires mod - max A for ADLs and mod A +2 during  functional mobility.  He is very motivated, and has very supportive family.  Mr. Pioli was very independent prior to onset of falls mid January, and has had an unfortunate round of events that have led to a significant decline in his functional status.  Feel CIR is best venue to provide intensity of therapies,structure,  and consistency that he needs.  Feel with CIR, he will be able to return home with his wife at mod I- supervision level.  Based on his participation during evaluation, feel he will be able to tolerate the intensity of CIR.  Will follow acutely.       Follow Up Recommendations  CIR;Supervision/Assistance - 24 hour    Equipment Recommendations  3 in 1 bedside commode;Tub/shower bench    Recommendations for Other Services Rehab consult     Precautions / Restrictions Precautions Precautions: Fall Precaution Comments: h/o recent falls.  No history of falls prior to 07/24/16 Restrictions Weight Bearing  Restrictions: Yes LLE Weight Bearing: Weight bearing as tolerated      Mobility Bed Mobility Overal bed mobility: Needs Assistance Bed Mobility: Supine to Sit;Sit to Supine   Sidelying to sit: Mod assist;+2 for safety/equipment   Sit to supine: Min assist;+2 for safety/equipment   General bed mobility comments: Pt requires step by step cues for sequencing and technique.  Assist to initiate activity and to lift trunk   Transfers Overall transfer level: Needs assistance Equipment used: Rolling walker (2 wheeled) Transfers: Sit to/from [facility] Sit to Stand: Mod assist;+2 physical assistance Stand pivot transfers: Mod assist;+2 physical assistance;+2 safety/equipment       General transfer comment: Pt requires A to power up into standing (bed height elevated).  He requires assist for balance and for advancement of RW during transfer.  Posterior lean noted     Balance Overall balance assessment: Needs assistance Sitting-balance support: Feet supported;Single extremity supported Sitting balance-Leahy Scale: Fair     Standing balance support: Bilateral upper extremity supported Standing balance-Leahy Scale: Poor Standing balance comment: Pt requires bil. UE support and mod A                             ADL Overall ADL's : Needs assistance/impaired Eating/Feeding: Set up;Sitting Eating/Feeding Details (indicate cue type and reason): Per family, pt with poor appetite  Grooming: Wash/dry hands;Wash/dry face;Oral care;Brushing hair;Set up;Sitting   Upper Body Bathing: Minimal assistance;Sitting   Lower Body Bathing: Maximal assistance;Sit to/from stand  Upper Body Dressing : Sitting;Minimal assistance   Lower Body Dressing: Total assistance;Sit to/from stand Lower Body Dressing Details (indicate cue type and reason): Pt with difficulty accessing LEs due to pain  Toilet Transfer: Moderate assistance;+2 for safety/equipment;Ambulation;Comfort  height toilet;BSC;Grab bars;RW   Toileting- Clothing Manipulation and Hygiene: Maximal assistance;Sit to/from stand       Functional mobility during ADLs: Moderate assistance;+2 for safety/equipment;Rolling walker General ADL Comments: Pt is very motivated.  He is limited by pain and decreased balance.      Vision Additional Comments: Pt with complaint of dizziness with postural changes.  No evidence of nystagmus noted.  Occulomotor assessment difficulty due to pt with difficulty following multi step commands.  He demonstrates significant difficulty sustaining far Lt and Rt gaze and frequently initiates saccades the returning to the far lt or rt    Perception     Praxis      Pertinent Vitals/Pain Pain Assessment: 0-10 Pain Score: 8  Pain Location: Lt hip  Pain Descriptors / Indicators: Grimacing;Operative site guarding;Aching Pain Intervention(s): Monitored during session;Repositioned;Limited activity within patient's tolerance     Hand Dominance Right   Extremity/Trunk Assessment Upper Extremity Assessment Upper Extremity Assessment: Generalized weakness   Lower Extremity Assessment Lower Extremity Assessment: Defer to PT evaluation   Cervical / Trunk Assessment Cervical / Trunk Assessment: Kyphotic   Communication Communication Communication: No difficulties   Cognition Arousal/Alertness: Awake/alert Behavior During Therapy: WFL for tasks assessed/performed Overall Cognitive Status: Impaired/Different from baseline Area of Impairment: Attention;Memory;Following commands;Safety/judgement;Problem solving   Current Attention Level: Sustained Memory: Decreased short-term memory Following Commands: Follows one step commands consistently;Follows multi-step commands inconsistently Safety/Judgement: Decreased awareness of safety;Decreased awareness of deficits   Problem Solving: Slow processing;Difficulty sequencing;Requires verbal cues;Requires tactile cues General  Comments: Pt with h/o delerium during recent hospitalizations thought to be due to medications.  Pt with mild memory deficit PTA, but able to compensate    General Comments       Exercises       Shoulder Instructions      Home Living Family/patient expects to be discharged to:: Inpatient rehab Living Arrangements: Spouse/significant other Available Help at Discharge: Family;Available 24 hours/day Type of Home: House Home Access: Stairs to enter [employer] of Steps: 4 Entrance Stairs-Rails: Right Home Layout: Multi-level;Able to live on main level with bedroom/bathroom     Bathroom Shower/Tub: Teacher, early years/pre: Standard     Home Equipment: Environmental consultant - 2 wheels;Other (comment)   Additional Comments: Pt has very supportive family       Prior Functioning/Environment Level of Independence: Independent        Comments: prior to recent falls, pt was very independent.  He was active in the community, rides his elliptical daily, enjoys watching and tracking the stock market and is an avid reader         OT Problem List: Decreased strength;Decreased activity tolerance;Impaired balance (sitting and/or standing);Decreased cognition;Decreased safety awareness;Decreased knowledge of use of DME or AE;Pain;Impaired vision/perception   OT Treatment/Interventions: Self-care/ADL training;DME and/or AE instruction;Therapeutic activities;Cognitive remediation/compensation;Patient/family education;Balance training    OT Goals(Current goals can be found in the care plan section) Acute Rehab OT Goals Patient Stated Goal: go to CIR and regain independence  OT Goal Formulation: With patient/family Time For Goal Achievement: 08/13/16 Potential to Achieve Goals: Good ADL Goals Pt Will Perform Grooming: with min assist;standing Pt Will Perform Upper Body Bathing: with set-up;sitting Pt Will Perform Lower Body Bathing: with min assist;with adaptive equipment;sit  to/from stand  Pt Will Perform Upper Body Dressing: with set-up;sitting Pt Will Perform Lower Body Dressing: with min assist;with adaptive equipment;sit to/from stand Pt Will Transfer to Toilet: with min assist;ambulating;regular height toilet;bedside commode;grab bars Pt Will Perform Toileting - Clothing Manipulation and hygiene: with min assist;sit to/from stand  OT Frequency: Min 2X/week   Barriers to D/C:            Co-evaluation PT/OT/SLP Co-Evaluation/Treatment: Yes Reason for Co-Treatment: Complexity of the patient's impairments (multi-system involvement);For patient/therapist safety;To address functional/ADL transfers   OT goals addressed during session: ADL's and self-care;Strengthening/ROM      End of Session Equipment Utilized During Treatment: Gait belt;Rolling walker Nurse Communication: Mobility status  Activity Tolerance: Patient tolerated treatment well Patient left: in chair;with call bell/phone within reach;with chair alarm set;with family/visitor present;with nursing/sitter in room   Time: 1415-1510 OT Time Calculation (min): 55 min Charges:  OT General Charges $OT Visit: 1 Procedure OT Evaluation $OT Eval Moderate Complexity: 1 Procedure OT Treatments $Therapeutic Activity: 8-22 mins G-Codes:    Hannie Shoe M 2016/08/29, 4:33 PM

## 2016-08-06 NOTE — Progress Notes (Signed)
ANTICOAGULATION CONSULT NOTE - Initial Consult  Pharmacy Consult for Coumadin Indication: atrial fibrillation  Allergies  Allergen Reactions  . Penicillins Anaphylaxis and Other (See Comments)    SYNCOPE > "Causes pt to pass out "  Has patient had a PCN reaction causing immediate rash, facial/tongue/throat swelling, SOB or lightheadedness with hypotension:  #  #  #  YES  #  #  #  Has patient had a PCN reaction causing severe rash involving mucus membranes or skin necrosis: No Has patient had a PCN reaction that required hospitalization #  #  #  YES  #  #  #  Has patient had a PCN reaction occurring within the last 10 years: No  . Enoxaparin Other (See Comments)    REACTION IS DOSE RELATED >>"Internal bleeding"    Patient Measurements: TBW 78 kg   Assessment: 83 yo M presents after fall. Now s/p left hip replacement to restart Coumadin. PTA dose was 4mg  daily. INR was 1.28 on 2/1. Restarted on 2/3, INR today is 1.2. Hgb down to 9.3, plts wnl. No s/s of bleed.  Goal of Therapy:  INR 2-3 Monitor platelets by anticoagulation protocol: Yes   Plan:  Give Coumadin 4mg  PO x 1  Monitor daily INR, CBC, s/s of bleed  Elenor Quinones, PharmD, [facility] Clinical Pharmacist Pager [phone] 08/06/2016 8:03 AM

## 2016-08-06 NOTE — Progress Notes (Signed)
Daughter expressed concern that pt was given trazadone as ordered at hs. She wants him to discontinue this med.

## 2016-08-06 NOTE — Evaluation (Signed)
Physical Therapy Evaluation Patient Details Name: Isaac Villa MRN: [id number] DOB: [birth year] Today's Date: 08/06/2016    History of Present Illness  This 83 y.o. male admitted after slipping on tile floor at SNF (for rehab) while ambulating to BR.  He sustained Lt femur fx, and underwent THA direct anterior approach.   PMH includes recent falls.  He fell on ice and sustained hematoma, and was found to have supratherapeutic INR while on coumadin, he fell again at home due to symptomatic anemia (was discharged to SNF after thad recent admission); glaucoma, A-Fib, glaucoma, gout, HTN, melanoma   Clinical Impression   Patient is s/p above surgery resulting in functional limitations due to the deficits listed below (see PT Problem List). Pt presents to PT with generalized weakness, pain, impaired cognition due to delerium thought to be secondary to meds, and impaired balance.   He currently requires mod - max A for ADLs and mod A +2 during  functional mobility.  He is very motivated, and has very supportive family.  Isaac Villa was very independent prior to onset of falls mid January, and has had an unfortunate round of events that have led to a significant decline in his functional status.  Feel CIR is best venue to provide intensity of therapies,structure,  and consistency that he needs.  Feel with CIR, he will be able to return home with his wife at mod I- supervision level.  Based on his participation during evaluation, feel he will be able to tolerate the intensity of CIR. Patient will benefit from skilled PT to increase their independence and safety with mobility to allow discharge to the venue listed below.       Follow Up Recommendations CIR    Equipment Recommendations  Rolling walker with 5" wheels;3in1 (PT)    Recommendations for Other Services Rehab consult (already done)     Precautions / Restrictions Precautions Precautions: Fall Precaution Comments: h/o recent falls.  No history  of falls prior to 07/24/16 Restrictions Weight Bearing Restrictions: Yes LLE Weight Bearing: Weight bearing as tolerated      Mobility  Bed Mobility Overal bed mobility: Needs Assistance Bed Mobility: Supine to Sit   Sidelying to sit: Mod assist;+2 for safety/equipment Supine to sit: Mod assist;+2 for safety/equipment Sit to supine: Min assist;+2 for safety/equipment   General bed mobility comments: Pt requires step by step cues for sequencing and technique.  Assist to initiate activity and to lift trunk   Transfers Overall transfer level: Needs assistance Equipment used: Rolling walker (2 wheeled) Transfers: Sit to/from Stand Sit to Stand: Mod assist;+2 physical assistance Stand pivot transfers: Mod assist;+2 physical assistance;+2 safety/equipment       General transfer comment: Pt requires A to power up into standing (bed height elevated).  He requires assist for balance and for advancement of RW during transfer.  Posterior lean noted   Ambulation/Gait Ambulation/Gait assistance: +2 safety/equipment;Mod assist Ambulation Distance (Feet): 30 Feet Assistive device: Rolling walker (2 wheeled) Gait Pattern/deviations: Step-to pattern;Decreased stance time - left     General Gait Details: Step-by-step cues for sequence and RW use and proximity; Noting frequent posterior lean, cues to sift weight forward and push down into RW; RW sized for optimal height  Stairs            Wheelchair Mobility    Modified Rankin (Stroke Patients Only)       Balance Overall balance assessment: Needs assistance Sitting-balance support: Feet supported;Single extremity supported Sitting balance-Leahy Scale: Fair  Standing balance support: Bilateral upper extremity supported Standing balance-Leahy Scale: Poor Standing balance comment: Pt requires bil. UE support and mod A                              Pertinent Vitals/Pain Pain Assessment: 0-10 Pain Score: 8  Pain  Location: Lt hip with weight bearing activities Pain Descriptors / Indicators: Grimacing;Operative site guarding;Aching Pain Intervention(s): Monitored during session    Home Living Family/patient expects to be discharged to:: Inpatient rehab Living Arrangements: Spouse/significant other Available Help at Discharge: Family;Available 24 hours/day Type of Home: House Home Access: Stairs to enter Entrance Stairs-Rails: Right Entrance Stairs-Number of Steps: 4 Home Layout: Multi-level;Able to live on main level with bedroom/bathroom Home Equipment: Gilford Rile - 2 wheels;Other (comment) Additional Comments: Pt has very supportive family     Prior Function Level of Independence: Independent         Comments: prior to recent falls, pt was very independent.  He was active in the community, rides his elliptical daily, enjoys watching and tracking the stock market and is an avid reader      Hand Dominance   Dominant Hand: Right    Extremity/Trunk Assessment   Upper Extremity Assessment Upper Extremity Assessment: Defer to OT evaluation    Lower Extremity Assessment Lower Extremity Assessment: Generalized weakness;RLE deficits/detail RLE Deficits / Details: Grossly decr AROM and strength, limited by pain postop    Cervical / Trunk Assessment Cervical / Trunk Assessment: Kyphotic  Communication   Communication: No difficulties  Cognition Arousal/Alertness: Awake/alert Behavior During Therapy: WFL for tasks assessed/performed Overall Cognitive Status: Impaired/Different from baseline Area of Impairment: Attention;Memory;Following commands;Safety/judgement;Problem solving   Current Attention Level: Sustained Memory: Decreased short-term memory Following Commands: Follows one step commands consistently;Follows multi-step commands inconsistently Safety/Judgement: Decreased awareness of safety;Decreased awareness of deficits   Problem Solving: Slow processing;Difficulty  sequencing;Requires verbal cues;Requires tactile cues General Comments: Pt with h/o delerium during recent hospitalizations thought to be due to medications.  Pt with mild memory deficit PTA, but able to compensate     General Comments General comments (skin integrity, edema, etc.): VSS throughout session.  02 sats on RA >93%    Exercises     Assessment/Plan    PT Assessment Patient needs continued PT services  PT Problem List Decreased strength;Decreased range of motion;Decreased activity tolerance;Decreased balance;Decreased mobility;Decreased coordination;Decreased cognition;Decreased knowledge of use of DME;Decreased safety awareness;Pain          PT Treatment Interventions DME instruction;Gait training;Functional mobility training;Therapeutic activities;Therapeutic exercise;Balance training;Patient/family education    PT Goals (Current goals can be found in the Care Plan section)  Acute Rehab PT Goals Patient Stated Goal: go to CIR and regain independence  PT Goal Formulation: With patient/family Time For Goal Achievement: 08/20/16 Potential to Achieve Goals: Good    Frequency Min 6X/week   Barriers to discharge        Co-evaluation PT/OT/SLP Co-Evaluation/Treatment: Yes Reason for Co-Treatment: Complexity of the patient's impairments (multi-system involvement) PT goals addressed during session: Mobility/safety with mobility OT goals addressed during session: ADL's and self-care;Strengthening/ROM       End of Session Equipment Utilized During Treatment: Gait belt Activity Tolerance: Patient tolerated treatment well Patient left: in chair;with chair alarm set;with call bell/phone within reach;with family/visitor present Nurse Communication: Mobility status         Time: 1410-1503 PT Time Calculation (min) (ACUTE ONLY): 53 min   Charges:   PT Evaluation $PT Eval Moderate  Complexity: 1 Procedure PT Treatments $Gait Training: 8-22 mins   PT G Codes:         Colletta [place] 08/06/2016, 5:48 PM  Roney Marion, [place] Pager [phone] Office [phone]

## 2016-08-06 NOTE — Consult Note (Signed)
Physical Medicine and Rehabilitation Consult Reason for Consult: Evaluate rehabilitation needs after a left hip femoral neck fracture Referring Phsyician: Isaac Villa is an 83 y.o. male.   HPI: Patient was very active, living at home up until 07/27/2016 when he fell and sustained a hematoma to his right hip. He has a history of chronic atrial fibrillation on chronic Coumadin. He was discharged to the skilled nursing facility on 08/01/2016. However, he had another fall in his room onto his left hip, sustaining a left femoral neck fracture. He was readmitted to [facility] 08/02/2016 and underwent blood transfusion, reversal of INR, as well as cardiology evaluation for operative clearance. Underwent anterior approach total hip arthroplasty on 08/05/2016. No postoperative PT, OT thus far  Review of Systems  Constitutional: Negative for chills and weight loss.  HENT: Negative for congestion.   Eyes: Negative for double vision, photophobia and redness.  Respiratory: Negative for cough, hemoptysis, shortness of breath and stridor.   Cardiovascular: Negative for chest pain, palpitations and leg swelling.  Gastrointestinal: Negative for abdominal pain, constipation, diarrhea, nausea and vomiting.  Genitourinary: Negative for dysuria and hematuria.  Musculoskeletal: Positive for falls and joint pain.  Skin: Negative for itching and rash.  Neurological: Positive for weakness. Negative for sensory change and headaches.  Endo/Heme/Allergies: Bruises/bleeds easily.  Psychiatric/Behavioral: Negative for hallucinations and substance abuse. The patient does not have insomnia.     Past Medical History:  Diagnosis Date  . Atrial fibrillation (HCC)    Chronic  . BPH (benign prostatic hypertrophy)   . Edema extremities 06/04/2014  . Elevated cholesterol   . Fractured femoral neck ([place]) 08/02/2016   CLOSED FRACTURE  . Glaucoma   . Gout   . Hernia   . Herniated disc   . Hypertension   .  Melanoma ([place])   . Melanoma ([place])   . Thrombocytopenia ([place])    Past Surgical History:  Procedure Laterality Date  . APPENDECTOMY    . CATARACT EXTRACTION Bilateral 04/2008  . EYE SURGERY     lens replacements  . HERNIA REPAIR    . JOINT REPLACEMENT     left knee  . Max SURGERY  07/2004  . REPLACEMENT TOTAL KNEE Left 11/2006  . TONSILLECTOMY     Family History  Problem Relation Age of Onset  . Hypertension Mother   . Prostate cancer Father    Social History:  reports that he has quit smoking. He has never used smokeless tobacco. He reports that he drinks alcohol. He reports that he does not use drugs. Allergies:  Allergies  Allergen Reactions  . Penicillins Anaphylaxis and Other (See Comments)    SYNCOPE > "Causes pt to pass out "  Has patient had a PCN reaction causing immediate rash, facial/tongue/throat swelling, SOB or lightheadedness with hypotension:  #  #  #  YES  #  #  #  Has patient had a PCN reaction causing severe rash involving mucus membranes or skin necrosis: No Has patient had a PCN reaction that required hospitalization #  #  #  YES  #  #  #  Has patient had a PCN reaction occurring within the last 10 years: No  . Enoxaparin Other (See Comments)    REACTION IS DOSE RELATED >>"Internal bleeding"   Medications Prior to Admission  Medication Sig Dispense Refill  . acetaminophen (TYLENOL) 650 MG CR tablet Take 1,300 mg by mouth every 8 (eight) hours as needed for pain.    Marland Kitchen  allopurinol (ZYLOPRIM) 300 MG tablet Take 300 mg by mouth daily at 12 noon.     . Calcium Carb-Cholecalciferol (CALCIUM + D3) 600-200 MG-UNIT TABS Take 1 tablet by mouth daily at 12 noon.     . digoxin (LANOXIN) 0.125 MG tablet Take 0.125 mg by mouth daily.    Marland Kitchen diltiazem (CARDIZEM CD) 240 MG 24 hr capsule Take 240 mg by mouth daily at 12 noon.    . docusate sodium (COLACE) 100 MG capsule Take 1 capsule (100 mg total) by mouth 2 (two) times daily. 10 capsule 0  . finasteride (PROSCAR)  5 MG tablet Take 5 mg by mouth at bedtime.     . hydrochlorothiazide (HYDRODIURIL) 25 MG tablet Take 25 mg by mouth daily.    Marland Kitchen HYDROcodone-acetaminophen (NORCO/VICODIN) 5-325 MG tablet Take 1 tablet by mouth every 6 (six) hours as needed for moderate pain. 15 tablet 0  . lactose free nutrition (BOOST PLUS) LIQD Take 237 mLs by mouth 3 (three) times daily between meals. 90 Can 0  . latanoprost (XALATAN) 0.005 % ophthalmic solution Place 1 drop into both eyes at bedtime.     Marland Kitchen lisinopril (PRINIVIL,ZESTRIL) 10 MG tablet Take 1 tablet (10 mg total) by mouth daily. 90 tablet 2  . magnesium oxide (MAGNESIUM-OXIDE) 400 (241.3 Mg) MG tablet Take 400 mg by mouth daily at 12 noon.    . Multiple Vitamin (MULTIVITAMIN WITH MINERALS) TABS tablet Take 1 tablet by mouth daily at 12 noon.    Marland Kitchen omega-3 acid ethyl esters (LOVAZA) 1 g capsule Take 1 g by mouth daily at 12 noon.    . potassium chloride SA (K-DUR,KLOR-CON) 20 MEQ tablet Take 20 mEq by mouth 2 (two) times daily.    . predniSONE (DELTASONE) 20 MG tablet Take 2 tablets (40 mg total) by mouth daily. 8 tablet 0  . tamsulosin (FLOMAX) 0.4 MG CAPS capsule Take 0.4 mg by mouth daily.    Marland Kitchen warfarin (COUMADIN) 4 MG tablet Take 4 mg by mouth daily.  4    Home: Home Living Family/patient expects to be discharged to:: Private residence Living Arrangements: Spouse/significant other  Functional History:   Functional Status:  Mobility:          ADL:    Cognition: Cognition Orientation Level: Oriented to person, Oriented to time, Disoriented to place, Disoriented to situation    Blood pressure 132/61, pulse 92, temperature 99.4 F (37.4 C), temperature source Oral, resp. rate 18, SpO2 90 %. Physical Exam  Nursing note and vitals reviewed. Constitutional: He is oriented to person, place, and time. He appears well-developed and well-nourished.  HENT:  Head: Normocephalic and atraumatic.  Eyes: Conjunctivae and EOM are normal. Pupils are equal,  round, and reactive to light.  Neck: Normal range of motion.  Cardiovascular: Normal rate and regular rhythm.   No murmur heard. Respiratory: Effort normal and breath sounds normal. No respiratory distress.  GI: Soft. Bowel sounds are normal. He exhibits no distension. There is no tenderness.  Neurological: He is alert and oriented to person, place, and time. He has normal reflexes.  Psychiatric: He has a normal mood and affect.    Results for orders placed or performed during the hospital encounter of 08/02/16 (from the past 24 hour(s))  CBC     Status: Abnormal   Collection Time: 08/06/16  2:54 AM  Result Value Ref Range   WBC 16.8 (H) 4.0 - 10.5 K/uL   RBC 3.18 (L) 4.22 - 5.81 MIL/uL   Hemoglobin  9.3 (L) 13.0 - 17.0 g/dL   HCT 29.3 (L) 39.0 - 52.0 %   MCV 92.1 78.0 - 100.0 fL   MCH 29.2 26.0 - 34.0 pg   MCHC 31.7 30.0 - 36.0 g/dL   RDW 16.7 (H) 11.5 - 15.5 %   Platelets 227 150 - 400 K/uL  Basic metabolic panel     Status: Abnormal   Collection Time: 08/06/16  2:54 AM  Result Value Ref Range   Sodium 140 135 - 145 mmol/L   Potassium 4.2 3.5 - 5.1 mmol/L   Chloride 110 101 - 111 mmol/L   CO2 23 22 - 32 mmol/L   Glucose, Bld 148 (H) 65 - 99 mg/dL   BUN 25 (H) 6 - 20 mg/dL   Creatinine, Ser 0.92 0.61 - 1.24 mg/dL   Calcium 7.9 (L) 8.9 - 10.3 mg/dL   GFR calc non Af Amer >60 >60 mL/min   GFR calc Af Amer >60 >60 mL/min   Anion gap 7 5 - 15  Protime-INR     Status: Abnormal   Collection Time: 08/06/16  2:54 AM  Result Value Ref Range   Prothrombin Time 15.3 (H) 11.4 - 15.2 seconds   INR 1.20   Magnesium     Status: None   Collection Time: 08/06/16  2:54 AM  Result Value Ref Range   Magnesium 2.0 1.7 - 2.4 mg/dL  Phosphorus     Status: None   Collection Time: 08/06/16  2:54 AM  Result Value Ref Range   Phosphorus 3.8 2.5 - 4.6 mg/dL   Dg C-arm 1-60 Min  Result Date: 08/05/2016 CLINICAL DATA:  Status post left total hip arthroplasty. EXAM: DG C-ARM 61-120 MIN;  OPERATIVE LEFT HIP WITH PELVIS FLUOROSCOPY TIME:  23 seconds. COMPARISON:  Radiographs of August 02, 2016. FINDINGS: The femoral and acetabular components appear to be well situated. No fracture or dislocation is noted. Expected postoperative changes are seen in the surrounding soft tissues. IMPRESSION: Status post left total hip arthroplasty. Electronically Signed   By: Marijo Conception, M.D.   On: 08/05/2016 09:54   Dg Hip Port Unilat With Pelvis 1v Left  Result Date: 08/05/2016 CLINICAL DATA:  Status post left hip replacement EXAM: DG HIP (WITH OR WITHOUT PELVIS) 1V PORT LEFT COMPARISON:  None. FINDINGS: The acetabular and femoral components of the left hip replacement are in good position. Soft tissue air is postoperative in nature. No other interval changes. IMPRESSION: Left hip replacement as above. Electronically Signed   By: Dorise Bullion III M.D   On: 08/05/2016 11:14   Dg Hip Operative Unilat W Or W/o Pelvis Left  Result Date: 08/05/2016 CLINICAL DATA:  Status post left total hip arthroplasty. EXAM: DG C-ARM 61-120 MIN; OPERATIVE LEFT HIP WITH PELVIS FLUOROSCOPY TIME:  23 seconds. COMPARISON:  Radiographs of August 02, 2016. FINDINGS: The femoral and acetabular components appear to be well situated. No fracture or dislocation is noted. Expected postoperative changes are seen in the surrounding soft tissues. IMPRESSION: Status post left total hip arthroplasty. Electronically Signed   By: Marijo Conception, M.D.   On: 08/05/2016 09:54    Assessment/Plan: Diagnosis: Left hip fracture, status post left total hip arthroplasty. 1. Does the need for close, 24 hr/day medical supervision in concert with the patient's rehab needs make it unreasonable for this patient to be served in a less intensive setting? Yes 2. Co-Morbidities requiring supervision/potential complications: Frequent falls, postoperative pain management, atrial fibrillation 3. Due to bladder management,  bowel management, safety,  skin/wound care, disease management, medication administration, pain management and patient education, does the patient require 24 hr/day rehab nursing? Yes 4. Does the patient require coordinated care of a physician, rehab nurse, PT (1-2 hrs/day, 5 days/week) and OT (1-2 hrs/day, 5 days/week) to address physical and functional deficits in the context of the above medical diagnosis(es)? Yes Addressing deficits in the following areas: balance, endurance, locomotion, strength, transferring, bowel/bladder control, bathing, dressing, feeding, grooming, toileting and psychosocial support 5. Can the patient actively participate in an intensive therapy program of at least 3 hrs of therapy per day at least 5 days per week? Potentially 6. The potential for patient to make measurable gains while on inpatient rehab is Pending PT eval 7. Anticipated functional outcomes upon discharge from inpatients are supervision PT, supervision OT, not applicable SLP 8. Estimated rehab length of stay to reach the above functional goals is: Tending PT eval 9. Does the patient have adequate social supports to accommodate these discharge functional goals? Potentially 10. Anticipated D/C setting: Home 11. Anticipated post D/C treatments: [place] therapy 12. Overall Rehab/Functional Prognosis: good  RECOMMENDATIONS: This patient's condition is appropriate for continued rehabilitative care in the following setting: CIR versus SNF depending on ability to tolerate therapy Patient has agreed to participate in recommended program. Yes Note that insurance prior authorization may be required for reimbursement for recommended care.  Comment:   Charlett Blake 08/06/2016

## 2016-08-06 NOTE — Progress Notes (Signed)
Pt continues to be confused although is easily reoriented. This evening he refused to take most of his po meds, although he was able to explain the chemistry, the use and the manufacturer of each medication. He finally agreed to take his medication after he was told that he was having PVC's and afib. He insisted on calling his wife around midnight and was calmer after speaking with her. He takes off his oxygen while asleep and has an episode of apnea lasting approximately 10 seconds/minute which causes his sats to drop into the low 80's. Family provided sitter is at the bedside.

## 2016-08-07 DIAGNOSIS — D181 Lymphangioma, any site: Secondary | ICD-10-CM

## 2016-08-07 LAB — BASIC METABOLIC PANEL
Anion gap: 3 — ABNORMAL LOW (ref 5–15)
BUN: 31 mg/dL — AB (ref 6–20)
CALCIUM: 7.8 mg/dL — AB (ref 8.9–10.3)
CO2: 25 mmol/L (ref 22–32)
CREATININE: 0.96 mg/dL (ref 0.61–1.24)
Chloride: 112 mmol/L — ABNORMAL HIGH (ref 101–111)
GFR calc non Af Amer: >60 mL/min (ref >60)
Glucose, Bld: 100 mg/dL — ABNORMAL HIGH (ref 65–99)
Potassium: 3.8 mmol/L (ref 3.5–5.1)
SODIUM: 140 mmol/L (ref 135–145)

## 2016-08-07 LAB — CBC
HCT: 28.2 % — ABNORMAL LOW (ref 39.0–52.0)
HEMOGLOBIN: 9 g/dL — AB (ref 13.0–17.0)
MCH: 29.8 pg (ref 26.0–34.0)
MCHC: 31.9 g/dL (ref 30.0–36.0)
MCV: 93.4 fL (ref 78.0–100.0)
Platelets: 201 10*3/uL (ref 150–400)
RBC: 3.02 MIL/uL — ABNORMAL LOW (ref 4.22–5.81)
RDW: 16.9 % — AB (ref 11.5–15.5)
WBC: 14.1 10*3/uL — ABNORMAL HIGH (ref 4.0–10.5)

## 2016-08-07 LAB — PROTIME-INR
INR: 1.21
PROTHROMBIN TIME: 15.4 s — AB (ref 11.4–15.2)

## 2016-08-07 MED ORDER — WARFARIN - PHARMACIST DOSING INPATIENT: Freq: Every day | Status: DC

## 2016-08-07 MED ORDER — WARFARIN SODIUM 7.5 MG PO TABS: 7.5000 mg | ORAL_TABLET | Freq: Once | ORAL | Status: DC

## 2016-08-07 NOTE — Progress Notes (Addendum)
I met with pt and his wife at bedside to discuss a possible inpt rehab admission. They both prefer an inpt rehab admission rather than SNF rehab. I have left a message for Isaac Villa, with Dr[facility], to further discuss disposition for possible d/c tomorrow. I am to meet with pt's daughters at 2 pm today to discuss rehab venue options. . [phone]

## 2016-08-07 NOTE — Progress Notes (Signed)
PROGRESS NOTE    Isaac Villa  [id number] DOB: [birth year] DOA: 08/02/2016 PCP: [name], MD   Brief Narrative:  Isaac Villa is a 83 y.o. male with medical history significant of atrophic fibrillation, BPH, HLD, glaucoma, gout, HTN, melanoma. Of note patient may recently admitted for approximately 5 day stay due to symptomatic anemia secondary to right thigh hematoma which was sustained after a mechanical fall on anticoagulation with supratherapeutic INR. Patient had been discharged from the hospital for 24 hours prior to readmission. Patient reports ambulating across the tile floor in an effort to use the bathroom when his feet slipped from underneath him. Patient fell to the ground striking his left hip. Unsure if he struck his head but denies any specific focal pain complaints outside of his left hip, chest pain, palpitations, nausea, vomiting, shortness of breath, fevers, abdominal pain, dysuria, frequency, back pain. Pain in left hip is constant. Worse with movement. Admitted for Left Hip Fracture and transfused 2 units of pRBC's. Orthopedics took patient to surgery on 08/05/16. MRI of brain was done and showed Hygroma so Neurosurgery was consulted and was recommended to stop Coumadin and start ASA 81. Dr. Ellene Route saw the patient this AM and recommended observation of the hygroma but advising against anticoagulation. Discussed with Dr. Alvan Dame and he is ok with transitioning the patient to 81 mg of ASA.   Assessment & Plan:   Active Problems:   Chronic atrial fibrillation (HCC)   Essential hypertension, benign   Symptomatic anemia   Traumatic hematoma of buttock   Hip fracture (HCC)   BPH (benign prostatic hyperplasia)   Protein calorie malnutrition (HCC)   Protein-calorie malnutrition, severe  Acute Left Hip fracture s/p Left Total Hip Replacement POD 2 -sustained after mechanical fall. Patient is total hip arthroplasty w/ anterior approach on 08/05/16 by Dr. Alvan Dame.  -  Discontinued Coumadin and transitioned to ASA 81 mg per Neurosurgery Recc's; Informed Orthopedics and ok with switch  - S/p 2 units PRBC per ortho request - Foley per ortho request. Will likely D/C - Pain mgt per Ortho; Acetaminophen 1000 mg q8h - Air overlay mattress - PT/OT after surgical repair. Given pts frequent falls due to deconditioning and now hip fracture, CIR may be appropriate for pt once able.  -Dr. Alysia Penna of CIR evaluated and maybe a candidate for CIR vs. SNF depending on ability to tolerate therapy. Possible D/C to SNF in AM? - Echo showed Definity used; normal LV function; mild AI; mildly dilated aortic root (4.5 cm); suggest CTA or MRA to further assess; biatrial enlargement; mild RVE; mild TR; small pericardial effusion. -CTA can be done as an outpatient per Cardiology -ECHO shows no active CHF -SNF vs CIR at D/C likely tomorrow; Dr. Letta Pate from [place] evaluated yesterday -Per Ortho Up with Therapy if able to cooperate; [facility] Delirium, improved -Avoid Sedating Medications -Bed Hygiene -Delirum Precautions -MRI Done and showed subdural Hygroma -Continue with Redirection as necessary -Patient completely lucid and alert and awake and oriented x3  Subdural Hygroma -MRI showed New subdural collection around the left cerebral convexity measuring up to 10 mm in thickness, signal most consistent with hygroma. Left cerebral mass effect is mild. CT follow-up is recommended. No postoperative infarct. IIncidental tectal lipoma. -DISCUSSED With Neurosurgeon Dr. Marcine Matar who recommended stopping Coumadin and starting on ASA 81 mg.  -Do not want conversion of subdural hygroma to convert to subdural hematoma -Continue to observe as it will likely resolve on its own  Leukocytosis -  WBC went from 12.2 -> 21.6 -> 16.8 -> 14.1 -Likely reactive in the setting of Hip Surgery -Continue to Monitor S/Sx of Infection; If worsens Pan Culture and obtain CXR and repeat  UA -Patient Afebrile  Anemia:  -7.7 on admission. Now 9.0 likely post operative change  -s/p 2 units PRBC per ortho; Still has large right ecchymosis and hematoma -C/w Ferrous Sulfate 325 mg po TID -CBC in am  Afib: chronic. Rate controlled - Continue Diltiazem, Digoxin - Coumadin restarted by Ortho but now D/C'd per Neurosurgery Recc's. Started ASA 81 mg now; Discussed with Orthopedics Dr. Alvan Dame about D/C'ing Coumadin and he is agreement of switch - Cardiology was concerned about full anticoagulation and recent falls and recommended not restarting Warfarin and using ASA 81 mg po instead if stability was a concern; Given the new hygroma will D/C - Cardiology not worried about pauses unless greater than 3 secs; Has had runs of [facility] but asymptomatic - Echo as above  HTN: -Continue Diltiazem, HCTZ, -Will need to restart Lisinopril however BP's on the softer side  BPH: - Continue Finasteride 5 mg po Daily and with Tamsulosisn 0.4 mg po Daily  Acute gouty arthritis:  -stated on Prednisone during last admission for R knee swelling and pain.  -Improved w/ steroids. 5 day course finished on 08/04/16 -Continue Allopurinol 300 mg po Daily  Protein calorie malnutrition: - nutrition consult - Pre-albumin - feeding supplement with Boost Plus 237 mL po TIDwm  Sinus Pause -Pause was 2.25 seconds. Continue to Monitor for greater than 3 seconds -Will check Electrolytes and repletes -Continue to Monitor closely; will hold Digoxin and Diltiazem if continues  Mildly Dilated Aortic Root -Discussed with Cardiology and CTA can be done as an outpatient  DVT prophylaxis: SCDs and ASA 81 mg po daily Code Status: FULL CODE Family Communication: Discussed with Wife at bedside Disposition Plan: Likely SNF or CIR in AM if stable  Consultants:   Orthopedic Surgery  Cardiology  CIR  Neurosurgery  Procedures: ECHOCARDIOGRAM Study Conclusions  - Left ventricle: The cavity size was  normal. There was mild focal   basal hypertrophy of the septum. Systolic function was normal.   The estimated ejection fraction was in the range of 50% to 55%.   Wall motion was normal; there were no regional wall motion   abnormalities. - Ventricular septum: Septal motion showed abnormal function and   dyssynergy. - Aortic valve: There was mild regurgitation. - Aortic root: The aortic root was mildly dilated. - Mitral valve: Calcified annulus. - Left atrium: The atrium was severely dilated. - Right ventricle: The cavity size was mildly dilated. - Right atrium: The atrium was severely dilated. - Pulmonary arteries: PA peak pressure: 32 mm Hg (S). - Pericardium, extracardiac: A small pericardial effusion was   identified.  Impressions:  - Definity used; normal LV function; mild AI; mildly dilated aortic   root (4.5 cm); suggest CTA or MRA to further assess; biatrial   enlargement; mild RVE; mild TR; small pericardial effusion   Antimicrobials:  Anti-infectives    Start     Dose/Rate Route Frequency Ordered Stop   08/05/16 1900  vancomycin (VANCOCIN) IVPB 1000 mg/200 mL premix     1,000 mg 200 mL/hr over 60 Minutes Intravenous Every 12 hours 08/05/16 1128 08/05/16 2147   08/05/16 1000  vancomycin (VANCOCIN) IVPB 1000 mg/200 mL premix  Status:  Discontinued     1,000 mg 200 mL/hr over 60 Minutes Intravenous On call to O.R. 08/03/16 [id number] 08/05/16  1113   08/05/16 0600  vancomycin (VANCOCIN) IVPB 1000 mg/200 mL premix     1,000 mg 200 mL/hr over 60 Minutes Intravenous To [facility] 08/04/16 2020 08/05/16 0825   08/05/16 0600  vancomycin (VANCOCIN) 1,500 mg in sodium chloride 0.9 % 500 mL IVPB  Status:  Discontinued     1,500 mg 250 mL/hr over 120 Minutes Intravenous On call to O.R. 08/04/16 2020 08/04/16 2024   08/05/16 0600  gentamicin (GARAMYCIN) 120 mg in dextrose 5 % 50 mL IVPB    Comments:  To be given in pre-operative with Vancomycin Single dose   1.5 mg/kg  78  kg 106 mL/hr over 30 Minutes Intravenous To ShortStay Surgical 08/04/16 2020 08/05/16 0917   08/03/16 0600  vancomycin (VANCOCIN) IVPB 1000 mg/200 mL premix  Status:  Discontinued     1,000 mg 200 mL/hr over 60 Minutes Intravenous On call to O.R. 08/02/16 1843 08/03/16 [id number]     Subjective: Seen at bedside and was completely lucid and not disoriented. Was very pleasant and had no issues and states Left Hip wasn't hurting. No Nausea and states he had a bowel movement and urinated after catheter was removed. No other complaints or concerns.   Objective: Vitals:   08/06/16 1434 08/06/16 2048 08/07/16 0415 08/07/16 0549  BP: 121/64 103/64 115/60   Pulse: 76 64 73   Resp:   18 18  Temp: 98 F (36.7 C) 97.7 F (36.5 C) 98.6 F (37 C)   TempSrc: Oral Oral Oral   SpO2: 93% 92% 94%     Intake/Output Summary (Last 24 hours) at 08/07/16 1530 Last data filed at 08/07/16 1100  Gross per 24 hour  Intake             1240 ml  Output             1801 ml  Net             -561 ml   There were no vitals filed for this visit.  Examination: Physical Exam:  Constitutional:  NAD and appears calm and comfortable;  Eyes: Lids and conjunctivae normal, sclerae anicteric  ENMT: External Ears, Nose appear normal. Grossly normal hearing.  Neck: Appears normal, supple, no cervical masses, normal ROM, no appreciable thyromegaly, no JVD Respiratory: Clear to auscultation bilaterally, no wheezing, rales, rhonchi or crackles. Normal respiratory effort and patient is not tachypenic. No accessory muscle use.  Cardiovascular: RRR, no murmurs / rubs / gallops. S1 and S2 auscultated.  Abdomen: Soft, non-tender, non-distended. No masses palpated. No appreciable hepatosplenomegaly. Bowel sounds positive.  GU: Deferred. Musculoskeletal: No clubbing / cyanosis of digits/nails. Improved ROM in Lower Extremities.  Skin: Large ecchymosis/hematoma on right lower leg and buttocks. Neurologic: CN 2-12 grossly intact with  no focal deficits. Romberg sign cerebellar reflexes not assessed.  Psychiatric: Normal judgment and insight. Alert and oriented x 3. Normal mood and appropriate affect.   Data Reviewed: I have personally reviewed following labs and imaging studies  CBC:  Recent Labs Lab 08/02/16 1255 08/03/16 0510 08/04/16 0541 08/05/16 1127 08/06/16 0254 08/07/16 0415  WBC 17.0* 10.3 12.2* 21.6* 16.8* 14.1*  NEUTROABS 15.1*  --  10.4* 20.4*  --   --   HGB 7.7* 9.1* 10.3* 10.6* 9.3* 9.0*  HCT 24.0* 28.5* 32.9* 33.5* 29.3* 28.2*  MCV 93.0 90.5 92.4 93.6 92.1 93.4  PLT 246 212 228 224 227 [id number]   Basic Metabolic Panel:  Recent Labs Lab 08/03/16 1917 08/04/16 0541 08/05/16 1127  08/06/16 0254 08/07/16 0415  NA 139 139 139 140 140  K 4.2 4.3 4.2 4.2 3.8  CL 108 108 105 110 112*  CO2 25 24 23 23 25   GLUCOSE 103* 101* 131* 148* 100*  BUN 30* 24* 18 25* 31*  CREATININE 0.82 0.88 0.87 0.92 0.96  CALCIUM 8.1* 8.1* 8.2* 7.9* 7.8*  MG 2.0 2.0 1.9 2.0  --   PHOS 2.4* 2.2* 3.4 3.8  --    GFR: Estimated Creatinine Clearance: 57.6 mL/min (by C-G formula based on SCr of 0.96 mg/dL). Liver Function Tests:  Recent Labs Lab 08/03/16 0510 08/04/16 0541 08/05/16 1127  AST 28 23 24   ALT 18 17 17   ALKPHOS 44 58 59  BILITOT 1.8* 1.8* 2.3*  PROT 4.2* 4.9* 4.9*  ALBUMIN 1.9* 2.3* 2.3*   No results for input(s): LIPASE, AMYLASE in the last 168 hours. No results for input(s): AMMONIA in the last 168 hours. Coagulation Profile:  Recent Labs Lab 08/02/16 1255 08/03/16 0510 08/06/16 0254 08/07/16 0415  INR 1.18 1.28 1.20 1.21   Cardiac Enzymes: No results for input(s): CKTOTAL, CKMB, CKMBINDEX, TROPONINI in the last 168 hours. BNP (last 3 results) No results for input(s): PROBNP in the last 8760 hours. HbA1C: No results for input(s): HGBA1C in the last 72 hours. CBG:  Recent Labs Lab 07/31/16 1731 07/31/16 2208 08/01/16 0738 08/01/16 1228  GLUCAP 150* 137* 165* 183*   Lipid  Profile: No results for input(s): CHOL, HDL, LDLCALC, TRIG, CHOLHDL, LDLDIRECT in the last 72 hours. Thyroid Function Tests: No results for input(s): TSH, T4TOTAL, FREET4, T3FREE, THYROIDAB in the last 72 hours. Anemia Panel: No results for input(s): VITAMINB12, FOLATE, FERRITIN, TIBC, IRON, RETICCTPCT in the last 72 hours. Sepsis Labs: No results for input(s): PROCALCITON, LATICACIDVEN in the last 168 hours.  Recent Results (from the past 240 hour(s))  Surgical PCR screen     Status: None   Collection Time: 08/03/16  4:00 AM  Result Value Ref Range Status   MRSA, PCR NEGATIVE NEGATIVE Final   Staphylococcus aureus NEGATIVE NEGATIVE Final    Comment:        The Xpert SA Assay (FDA approved for NASAL specimens in patients over 1 years of age), is one component of a comprehensive surveillance program.  Test performance has been validated by [facility] for patients greater than or equal to 72 year old. It is not intended to diagnose infection nor to guide or monitor treatment.   Culture, Urine     Status: Abnormal   Collection Time: 08/04/16 11:40 AM  Result Value Ref Range Status   Specimen Description URINE, RANDOM  Final   Special Requests NONE  Final   Culture <10,000 COLONIES/mL INSIGNIFICANT GROWTH (A)  Final   Report Status 08/05/2016 FINAL  Final    Radiology Studies: Mr Brain Wo Contrast  Result Date: 08/06/2016 CLINICAL DATA:  Confusion since hip surgery February 3rd. EXAM: MRI HEAD WITHOUT CONTRAST TECHNIQUE: Multiplanar, multiecho pulse sequences of the brain and surrounding structures were obtained without intravenous contrast. COMPARISON:  08/02/2016 head CT FINDINGS: Brain: Interval development of subdural collection around the left cerebral hemisphere which has MR features most compatible with hygroma. The collection measures up to 10 mm in thickness in the frontal region, mass effect on the left cerebral hemisphere is mild in the setting of atrophy. At baseline  the septum pellucidum is right of midline, and this appearance is mildly accentuated with the mass-effect. No brain edema. Incidental fatty mass along the  right tectum measuring 7 mm. Mild generalized volume loss. Mild periventricular chronic microvascular ischemia. No acute infarct or hydrocephalus Vascular: Preserved flow voids Skull and upper cervical spine: Negative Sinuses/Orbits: Bilateral cataract resection. No posttraumatic finding Other: These results will be called to the ordering clinician or representative by the Radiologist Assistant, and communication documented in the PACS or zVision Dashboard. IMPRESSION: 1. New subdural collection around the left cerebral convexity measuring up to 10 mm in thickness, signal most consistent with hygroma. Left cerebral mass effect is mild. CT follow-up is recommended. 2. No postoperative infarct. 3. Incidental tectal lipoma. Electronically Signed   By: Monte Fantasia M.D.   On: 08/06/2016 16:59   Scheduled Meds: . sodium chloride   Intravenous Once  . acetaminophen  1,000 mg Oral Q8H   Or  . acetaminophen  650 mg Rectal Q8H  . allopurinol  300 mg Oral Q1200  . aspirin EC  81 mg Oral Daily  . digoxin  0.125 mg Oral Daily  . diltiazem  240 mg Oral Q1200  . docusate sodium  100 mg Oral BID  . ferrous sulfate  325 mg Oral TID PC  . finasteride  5 mg Oral QHS  . hydrochlorothiazide  25 mg Oral Daily  . lactose free nutrition  237 mL Oral TID BM  . latanoprost  1 drop Both Eyes QHS  . magnesium oxide  400 mg Oral Q1200  . omega-3 acid ethyl esters  1 g Oral Q1200  . phosphorus  500 mg Oral TID  . sodium chloride flush  3 mL Intravenous Q12H  . tamsulosin  0.4 mg Oral Daily   Continuous Infusions:   LOS: 5 days   Kerney Elbe, DO [facility] Pager [phone]  If 7PM-7AM, please contact night-coverage [email or web address] Password [email or web address] 08/07/2016, 3:30 PM

## 2016-08-07 NOTE — Progress Notes (Signed)
Physical Therapy Treatment Patient Details Name: Isaac Villa MRN: [id number] DOB: August 08, 1924 Today's Date: 08/07/2016    History of Present Illness This 83 y.o. male admitted after slipping on tile floor at SNF (for rehab) while ambulating to BR.  He sustained Lt femur fx, and underwent THA direct anterior approach.   PMH includes recent falls.  He fell on ice and sustained hematoma, and was found to have supratherapeutic INR while on coumadin, he fell again at home due to symptomatic anemia (was discharged to SNF after thad recent admission); glaucoma, A-Fib, glaucoma, gout, HTN, melanoma     PT Comments    Pt is progressing with his mobility, requiring less heavy assistance today.  He was able to walk a short distance into the hallway, but fatigued quickly and was rolled back to his room in the recliner chair.  He is cognitively more clear today, but still has some STM issues and processing issues.  He continues to be an excellent CIR candidate.  PT will continue to follow acutely to facilitate safe mobility progression.   Follow Up Recommendations  CIR     Equipment Recommendations  Rolling walker with 5" wheels;3in1 (PT)    Recommendations for Other Services   NA     Precautions / Restrictions Precautions Precautions: Fall Precaution Comments: h/o recent falls.  No history of falls prior to 07/24/16 Restrictions Weight Bearing Restrictions: Yes LLE Weight Bearing: Weight bearing as tolerated    Mobility  Bed Mobility Overal bed mobility: Needs Assistance Bed Mobility: Supine to Sit     Supine to sit: Min assist;HOB elevated     General bed mobility comments: Pt requires assist to lift trunk from bed and mod cues for technique and hand placement.  HOB elevated and pt relying heavily on railings for leverage at trunk.   Transfers Overall transfer level: Needs assistance Equipment used: Rolling walker (2 wheeled) Transfers: Sit to/from Stand Sit to Stand: +2  safety/equipment;Min guard;From elevated surface Stand pivot transfers: Min assist       General transfer comment: Min guard assist for balance and stability of RW during transitions from elevated bed. Second person for safety, but ultimately not needed.    Ambulation/Gait Ambulation/Gait assistance: +2 safety/equipment;Min assist Ambulation Distance (Feet): 65 Feet Assistive device: Rolling walker (2 wheeled) Gait Pattern/deviations: Step-to pattern;Antalgic;Trunk flexed Gait velocity: decreased Gait velocity interpretation: Below normal speed for age/gender General Gait Details: Pt needed min assist for balance (less posterior preference today), and assist to steer RW.  Second person follwed with chair to encourage increased gait distance and safety.  Verbal cues for upright posture.           Balance Overall balance assessment: Needs assistance Sitting-balance support: Feet supported Sitting balance-Leahy Scale: Good Sitting balance - Comments: Pt able to lean forward to access feet to doff socks    Standing balance support: Bilateral upper extremity supported Standing balance-Leahy Scale: Poor Standing balance comment: Pt reliant on UE support and min guard assist.  Stood for 5 mins while OT preformed total assist peri care after pt had a BM earlier.                     Cognition Arousal/Alertness: Awake/alert Behavior During Therapy: WFL for tasks assessed/performed Overall Cognitive Status: Impaired/Different from baseline Area of Impairment: Attention;Memory;Following commands;Safety/judgement;Problem solving   Current Attention Level: Selective Memory: Decreased short-term memory Following Commands: Follows multi-step commands inconsistently Safety/Judgement: Decreased awareness of safety   Problem Solving: Difficulty sequencing;Requires  verbal cues;Requires tactile cues General Comments: Pt more appropriate today.  Still with some confusion     Exercises  Total Joint Exercises Short Arc Quad: AROM;Left;10 reps Heel Slides: AAROM;Left;10 reps Hip ABduction/ADduction: AAROM;Left;10 reps Long Arc Quad: AROM;Both;10 reps General Exercises - Lower Extremity Toe Raises: AROM;Both;20 reps Heel Raises: AROM;Both;20 reps    General Comments General comments (skin integrity, edema, etc.): wife present for entire session, daughter at the end.       Pertinent Vitals/Pain Pain Assessment: Faces Faces Pain Scale: Hurts even more Pain Location: Lt hip with weight bearing activities Pain Descriptors / Indicators: Grimacing;Operative site guarding;Aching Pain Intervention(s): Limited activity within patient's tolerance;Monitored during session;Repositioned           PT Goals (current goals can now be found in the care plan section) Acute Rehab PT Goals Patient Stated Goal: go to CIR and regain independence  Progress towards PT goals: Progressing toward goals    Frequency    Min 6X/week      PT Plan Current plan remains appropriate    Co-evaluation PT/OT/SLP Co-Evaluation/Treatment: Yes Reason for Co-Treatment: For patient/therapist safety PT goals addressed during session: Mobility/safety with mobility;Balance;Proper use of DME;Strengthening/ROM OT goals addressed during session: ADL's and self-care     End of Session Equipment Utilized During Treatment: Gait belt Activity Tolerance: Patient limited by fatigue;Patient limited by pain Patient left: in chair;with call bell/phone within reach;with family/visitor present;with chair alarm set     Time: [id number] PT Time Calculation (min) (ACUTE ONLY): 36 min  Charges:  $Gait Training: 8-22 mins                      Benancio Osmundson B. [place], [place], DPT [phone]   08/07/2016, 2:24 PM

## 2016-08-07 NOTE — Progress Notes (Signed)
I received a call from Dr. Alvan Dame approving pt's admission to inpt rehab Tuesday. [id number]

## 2016-08-07 NOTE — Consult Note (Signed)
Reason for Consult: Subdural hygroma Referring Physician: Dr. Karrie Doffing is an 84 y.o. male.  HPI: Asian is an 83 year old right-handed individual who would had a fall and fractured his hip also struck his head. Patient has been on evident anticoagulation. He underwent surgical intervention to stabilize his hip however was noted that patient became acutely delirious postoperatively. A MR scan of the brain was performed and this demonstrated presence of significant atrophy with a subdural hygroma overlying the left-sided convexity. There is a slight shift and very slight mass effect from this mass. The patient had a CAT scan of the brain performed immediately upon arrival to the hospital which was essentially within limits of normal save for significant atrophy by comparison to that CAT scan this MR presents significant abnormality. At this point the patient's sensorium appears clear he is oriented moving all 4 extremities well he denies any significant headache.  Past Medical History:  Diagnosis Date  . Atrial fibrillation (HCC)    Chronic  . BPH (benign prostatic hypertrophy)   . Edema extremities 06/04/2014  . Elevated cholesterol   . Fractured femoral neck ([place]) 08/02/2016   CLOSED FRACTURE  . Glaucoma   . Gout   . Hernia   . Herniated disc   . Hypertension   . Melanoma ([place])   . Melanoma ([place])   . Thrombocytopenia ([place])     Past Surgical History:  Procedure Laterality Date  . APPENDECTOMY    . CATARACT EXTRACTION Bilateral 04/2008  . EYE SURGERY     lens replacements  . HERNIA REPAIR    . JOINT REPLACEMENT     left knee  . [place] SURGERY  07/2004  . REPLACEMENT TOTAL KNEE Left 11/2006  . TONSILLECTOMY      Family History  Problem Relation Age of Onset  . Hypertension Mother   . Prostate cancer Father     Social History:  reports that he has quit smoking. He has never used smokeless tobacco. He reports that he drinks alcohol. He reports that he does  not use drugs.  Allergies:  Allergies  Allergen Reactions  . Penicillins Anaphylaxis and Other (See Comments)    SYNCOPE > "Causes pt to pass out "  Has patient had a PCN reaction causing immediate rash, facial/tongue/throat swelling, SOB or lightheadedness with hypotension:  #  #  #  YES  #  #  #  Has patient had a PCN reaction causing severe rash involving mucus membranes or skin necrosis: No Has patient had a PCN reaction that required hospitalization #  #  #  YES  #  #  #  Has patient had a PCN reaction occurring within the last 10 years: No  . Enoxaparin Other (See Comments)    REACTION IS DOSE RELATED >>"Internal bleeding"    Medications: I have reviewed the patient's current medications.  Results for orders placed or performed during the hospital encounter of 08/02/16 (from the past 48 hour(s))  CBC     Status: Abnormal   Collection Time: 08/06/16  2:54 AM  Result Value Ref Range   WBC 16.8 (H) 4.0 - 10.5 K/uL   RBC 3.18 (L) 4.22 - 5.81 MIL/uL   Hemoglobin 9.3 (L) 13.0 - 17.0 g/dL   HCT 29.3 (L) 39.0 - 52.0 %   MCV 92.1 78.0 - 100.0 fL   MCH 29.2 26.0 - 34.0 pg   MCHC 31.7 30.0 - 36.0 g/dL   RDW 16.7 (H) 11.5 -  15.5 %   Platelets 227 150 - 400 K/uL  Basic metabolic panel     Status: Abnormal   Collection Time: 08/06/16  2:54 AM  Result Value Ref Range   Sodium 140 135 - 145 mmol/L   Potassium 4.2 3.5 - 5.1 mmol/L   Chloride 110 101 - 111 mmol/L   CO2 23 22 - 32 mmol/L   Glucose, Bld 148 (H) 65 - 99 mg/dL   BUN 25 (H) 6 - 20 mg/dL   Creatinine, Ser 0.92 0.61 - 1.24 mg/dL   Calcium 7.9 (L) 8.9 - 10.3 mg/dL   GFR calc non Af Amer >60 >60 mL/min   GFR calc Af Amer >60 >60 mL/min    Comment: (NOTE) The eGFR has been calculated using the CKD EPI equation. This calculation has not been validated in all clinical situations. eGFR's persistently <60 mL/min signify possible Chronic Kidney Disease.    Anion gap 7 5 - 15  Protime-INR     Status: Abnormal   Collection  Time: 08/06/16  2:54 AM  Result Value Ref Range   Prothrombin Time 15.3 (H) 11.4 - 15.2 seconds   INR 1.20   Magnesium     Status: None   Collection Time: 08/06/16  2:54 AM  Result Value Ref Range   Magnesium 2.0 1.7 - 2.4 mg/dL  Phosphorus     Status: None   Collection Time: 08/06/16  2:54 AM  Result Value Ref Range   Phosphorus 3.8 2.5 - 4.6 mg/dL  CBC     Status: Abnormal   Collection Time: 08/07/16  4:15 AM  Result Value Ref Range   WBC 14.1 (H) 4.0 - 10.5 K/uL   RBC 3.02 (L) 4.22 - 5.81 MIL/uL   Hemoglobin 9.0 (L) 13.0 - 17.0 g/dL   HCT 28.2 (L) 39.0 - 52.0 %   MCV 93.4 78.0 - 100.0 fL   MCH 29.8 26.0 - 34.0 pg   MCHC 31.9 30.0 - 36.0 g/dL   RDW 16.9 (H) 11.5 - 15.5 %   Platelets 201 150 - 400 K/uL  Basic metabolic panel     Status: Abnormal   Collection Time: 08/07/16  4:15 AM  Result Value Ref Range   Sodium 140 135 - 145 mmol/L   Potassium 3.8 3.5 - 5.1 mmol/L   Chloride 112 (H) 101 - 111 mmol/L   CO2 25 22 - 32 mmol/L   Glucose, Bld 100 (H) 65 - 99 mg/dL   BUN 31 (H) 6 - 20 mg/dL   Creatinine, Ser 0.96 0.61 - 1.24 mg/dL   Calcium 7.8 (L) 8.9 - 10.3 mg/dL   GFR calc non Af Amer >60 >60 mL/min   GFR calc Af Amer >60 >60 mL/min    Comment: (NOTE) The eGFR has been calculated using the CKD EPI equation. This calculation has not been validated in all clinical situations. eGFR's persistently <60 mL/min signify possible Chronic Kidney Disease.    Anion gap 3 (L) 5 - 15  Protime-INR     Status: Abnormal   Collection Time: 08/07/16  4:15 AM  Result Value Ref Range   Prothrombin Time 15.4 (H) 11.4 - 15.2 seconds   INR 1.21     Mr Brain Wo Contrast  Result Date: 08/06/2016 CLINICAL DATA:  Confusion since hip surgery February 3rd. EXAM: MRI HEAD WITHOUT CONTRAST TECHNIQUE: Multiplanar, multiecho pulse sequences of the brain and surrounding structures were obtained without intravenous contrast. COMPARISON:  08/02/2016 head CT FINDINGS: Brain: Interval development of  subdural collection around the left cerebral hemisphere which has MR features most compatible with hygroma. The collection measures up to 10 mm in thickness in the frontal region, mass effect on the left cerebral hemisphere is mild in the setting of atrophy. At baseline the septum pellucidum is right of midline, and this appearance is mildly accentuated with the mass-effect. No brain edema. Incidental fatty mass along the right tectum measuring 7 mm. Mild generalized volume loss. Mild periventricular chronic microvascular ischemia. No acute infarct or hydrocephalus Vascular: Preserved flow voids Skull and upper cervical spine: Negative Sinuses/Orbits: Bilateral cataract resection. No posttraumatic finding Other: These results will be called to the ordering clinician or representative by the Radiologist Assistant, and communication documented in the PACS or zVision Dashboard. IMPRESSION: 1. New subdural collection around the left cerebral convexity measuring up to 10 mm in thickness, signal most consistent with hygroma. Left cerebral mass effect is mild. CT follow-up is recommended. 2. No postoperative infarct. 3. Incidental tectal lipoma. Electronically Signed   By: Monte Fantasia M.D.   On: 08/06/2016 16:59    Review of Systems  Unable to perform ROS: Other   Blood pressure 115/60, pulse 73, temperature 98.6 F (37 C), temperature source Oral, resp. rate 18, SpO2 94 %. Physical Exam  Constitutional: He appears well-developed and well-nourished.  HENT:  Head: Normocephalic and atraumatic.  Eyes: Conjunctivae and EOM are normal. Pupils are equal, round, and reactive to light.  Neck: Normal range of motion. Neck supple.  Neurological:  Patient is alert oriented and moving all 4 extremities well he offers no complaints of headache no evidence for diplopia either cranial nerve examination is within the limits of normal finger to nose finger and rapid alternating movements are also intact.  Skin: Skin is  warm and dry.  Psychiatric: He has a normal mood and affect. His behavior is normal. Judgment and thought content normal.    Assessment/Plan: Left frontal subdural hygroma with minimal mass effect in a patient with a clearing sensorium having had postoperative confusion and agitation for several days.  Plan I believe that simple observation of this lesion will be best it'll likely resolve on its own. However I would however be very cautious about anticoagulating this patient at this time particularly as this could convert the subpleural hygroma to a subdural hematoma which would be problematic.  Lacoya Wilbanks J 08/07/2016, 3:11 PM

## 2016-08-07 NOTE — Care Management Important Message (Signed)
Important Message  Patient Details  Name: Isaac Villa MRN: [id number] Date of Birth: July 02, 1925   Medicare Important Message Given:  Yes    Kyleah Pensabene 08/07/2016, 4:40 PM

## 2016-08-07 NOTE — Progress Notes (Signed)
Patient ID: Isaac Villa, male   DOB: 07-09-1924, 83 y.o.   MRN: [id number] Subjective: 2 Days Post-Op Procedure(s) (LRB): HEMI vs TOTAL HIP REPLACEMENT ANTERIOR APPROACH (Left)    Patient sleeping this am and given his course thus far I elected to not wake him and cause confusion  Objective:   VITALS:   Vitals:   08/07/16 0415 08/07/16 0549  BP: 115/60   Pulse: 73   Resp: 18 18  Temp: 98.6 F (37 C)     Incision: dressing C/D/I  LABS  Recent Labs  08/05/16 1127 08/06/16 0254 08/07/16 0415  HGB 10.6* 9.3* 9.0*  HCT 33.5* 29.3* 28.2*  WBC 21.6* 16.8* 14.1*  PLT 224 227 201     Recent Labs  08/05/16 1127 08/06/16 0254 08/07/16 0415  NA 139 140 140  K 4.2 4.2 3.8  BUN 18 25* 31*  CREATININE 0.87 0.92 0.96  GLUCOSE 131* 148* 100*     Recent Labs  08/06/16 0254 08/07/16 0415  INR 1.20 1.21     Assessment/Plan: 2 Days Post-Op Procedure(s) (LRB): HEMI vs TOTAL HIP REPLACEMENT ANTERIOR APPROACH (Left)   Advance diet Up with therapy   Discharge plan pending short term CIR versus SNF WBAT LLE Will follow  RTC in 2 weeks Coumadin for chronic a-fib and DVT prophylaxis Minimize pain meds to allow brain to recover from acute delirium related to hospital stay and pain meds

## 2016-08-07 NOTE — Progress Notes (Signed)
Occupational Therapy Treatment Patient Details Name: Isaac Villa MRN: [id number] DOB: Apr 11, 1925 Today's Date: 08/07/2016    History of present illness This 83 y.o. male admitted after slipping on tile floor at SNF (for rehab) while ambulating to BR.  He sustained Lt femur fx, and underwent THA direct anterior approach.   PMH includes recent falls.  He fell on ice and sustained hematoma, and was found to have supratherapeutic INR while on coumadin, he fell again at home due to symptomatic anemia (was discharged to SNF after thad recent admission); glaucoma, A-Fib, glaucoma, gout, HTN, melanoma    OT comments  Pt making excellent progress.  He is improved cognitively, but still with mild confusion.  He demonstrates improved activity tolerance, and is able to perform ADLs with mod A and functional mobility with min A (second person for safety).  He has very supportive family.   He had fall at SNF resulting in hip fx, and the desire of pt and family is to avoid SNF due to bad experience.  Feel he would benefit from structure and routine of CIR (due to delerium), as well as the intensity of therapies offered to allow him to discharge home at supervision level after CIR.  He is currently at high risk for fall and readmission.   Follow Up Recommendations  CIR;Supervision/Assistance - 24 hour    Equipment Recommendations  3 in 1 bedside commode;Tub/shower bench    Recommendations for Other Services Rehab consult    Precautions / Restrictions Precautions Precautions: Fall Precaution Comments: h/o recent falls.  No history of falls prior to 07/24/16 Restrictions Weight Bearing Restrictions: Yes LLE Weight Bearing: Weight bearing as tolerated       Mobility Bed Mobility Overal bed mobility: Needs Assistance Bed Mobility: Supine to Sit     Supine to sit: Min assist     General bed mobility comments: Pt requires assist to lift trunk from bed and mod cues for technique    Transfers Overall transfer level: Needs assistance Equipment used: Rolling walker (2 wheeled) Transfers: Sit to/from [facility] Sit to Stand: Min guard Stand pivot transfers: Min assist       General transfer comment: Pt requires assist for balance.  Second person was present based on performance yesterday, but not necessary this date     Balance Overall balance assessment: Needs assistance Sitting-balance support: Feet supported Sitting balance-Leahy Scale: Good Sitting balance - Comments: Pt able to lean forward to access feet to doff socks    Standing balance support: Bilateral upper extremity supported Standing balance-Leahy Scale: Poor Standing balance comment: Pt reliant on UE support and min guard assist                    ADL Overall ADL's : Needs assistance/impaired             Lower Body Bathing: Moderate assistance;Sit to/from stand Lower Body Bathing Details (indicate cue type and reason): mod A      Lower Body Dressing: Moderate assistance;Sit to/from stand Lower Body Dressing Details (indicate cue type and reason): Pt able to doff socks.  he requires max A to don them, and wife reports she does this for him at home.  Anticipate he will be able to don pants with mod A  Toilet Transfer: Minimal assistance;+2 for safety/equipment;Ambulation;Regular Toilet;BSC;Grab bars;RW   Toileting- Clothing Manipulation and Hygiene: Maximal assistance;Sit to/from stand Toileting - Clothing Manipulation Details (indicate cue type and reason): Pt had incontintent episode of diarrhea  and still with some residual stool on peri area.  Assisted him with peri care in standing      Functional mobility during ADLs: Minimal assistance;+2 for safety/equipment;Rolling walker General ADL Comments: pt more appropriate and engaged this date.  He continues to complain of dizziness, but reports it is improved.       Vision                 Additional  Comments: Pt able to read newspaper this date without difficulty    Perception     Praxis      Cognition   Behavior During Therapy: [facility] for tasks assessed/performed Overall Cognitive Status: Impaired/Different from baseline Area of Impairment: Attention;Memory;Following commands;Safety/judgement;Problem solving   Current Attention Level: Selective Memory: Decreased short-term memory  Following Commands: Follows multi-step commands inconsistently Safety/Judgement: Decreased awareness of safety   Problem Solving: Difficulty sequencing;Requires verbal cues;Requires tactile cues General Comments: Pt more appropriate today.  Still with some confusion     Extremity/Trunk Assessment               Exercises     Shoulder Instructions       General Comments      Pertinent Vitals/ Pain       Pain Assessment: Faces Faces Pain Scale: Hurts even more Pain Location: Lt hip with weight bearing activities Pain Descriptors / Indicators: Grimacing;Operative site guarding;Aching Pain Intervention(s): Monitored during session;Repositioned  Home Living                                          Prior Functioning/Environment              Frequency  Min 2X/week        Progress Toward Goals  OT Goals(current goals can now be found in the care plan section)  Progress towards OT goals: Progressing toward goals     Plan Discharge plan remains appropriate    Co-evaluation    PT/OT/SLP Co-Evaluation/Treatment: Yes Reason for Co-Treatment: For patient/therapist safety   OT goals addressed during session: ADL's and self-care      End of Session Equipment Utilized During Treatment: Gait belt;Rolling walker   Activity Tolerance Patient tolerated treatment well   Patient Left in chair;with call bell/phone within reach;with nursing/sitter in room;with chair alarm set   Nurse Communication Mobility status        Time: [id number] OT Time Calculation  (min): 38 min  Charges: OT General Charges $OT Visit: 1 Procedure OT Treatments $Therapeutic Activity: 8-22 mins  Saleem Coccia M 08/07/2016, 2:10 PM

## 2016-08-07 NOTE — Progress Notes (Signed)
ANTICOAGULATION CONSULT NOTE - Initial Consult  Pharmacy Consult for Coumadin Indication: atrial fibrillation  Allergies  Allergen Reactions  . Penicillins Anaphylaxis and Other (See Comments)    SYNCOPE > "Causes pt to pass out "  Has patient had a PCN reaction causing immediate rash, facial/tongue/throat swelling, SOB or lightheadedness with hypotension:  #  #  #  YES  #  #  #  Has patient had a PCN reaction causing severe rash involving mucus membranes or skin necrosis: No Has patient had a PCN reaction that required hospitalization #  #  #  YES  #  #  #  Has patient had a PCN reaction occurring within the last 10 years: No  . Enoxaparin Other (See Comments)    REACTION IS DOSE RELATED >>"Internal bleeding"   Assessment: CC/HPI: s/p fall  PMH: HTN, BPH, afib on warfarin  Anticoag: pta warfarin for afib, nows/p left hip replacement  INR was 1.28 on 2/1. Restarted on 2/3, INR today is 1.21  PTA dose was 4mg  daily.   Renal: SCr 0.96   Heme/Onc: H&H 9/28.2, Plt 201  Goal of Therapy:  INR 2-3 Monitor platelets by anticoagulation protocol: Yes   Plan:  Give Coumadin 7.5 mg PO x 1  Monitor daily INR, CBC, s/s of bleed  Levester Fresh, PharmD, BCPS, BCCCP Clinical Pharmacist Clinical phone for 08/07/2016 from 7a-3:30p: [phone] If after 3:30p, please call main pharmacy at: [phone] 08/07/2016 9:06 AM

## 2016-08-07 NOTE — PMR Pre-admission (Signed)
PMR Admission Coordinator Pre-Admission Assessment  Patient: Isaac Villa is an 83 y.o., male MRN: [id number] DOB: 1924-11-07 Height:   Weight:                Insurance Information HMO:     PPO:      PCP:      IPA:      80/20: yes     OTHER:  No HMO PRIMARY: Medicare a and b      Policy#: [id number]      Subscriber: pt Benefits:  Phone #: passport one online     Name: 08/07/16 Eff. Date: 02/01/92     Deduct: $1340      Out of Pocket Max: none      Life Max: none CIR: 100%      SNF: 20 full days. Was at [facility] less then 24 hrs before readmitted Outpatient: 80%     Co-Pay: 20% Home Health: 100%      Co-Pay: none DME: 80%     Co-Pay: 20% Providers: pt choice  SECONDARY: [employer]      Policy#: [id number]      Subscriber: pt  Medicaid Application Date:       Case Manager:  Disability Application Date:       Case Worker:   Emergency Contact Information Contact Information    Name Relation Home Work [place] Spouse [phone]  [phone]   Vegh,Gail Daughter   [phone]   Osher, Thu Daughter   [phone]   Kalyan, Scavetta   [phone]     Current Medical History  Patient Admitting Diagnosis: fall with subsequent left hip fracture, status total hip arthroplasty  History of Present Illness:    : HPI: 83 year old right-handed male with past medical history of left TKA10 years ago, gouty arthritis, chronic atrial fibrillation maintained on Coumadin, hypertension, melanoma. Patient recently admitted 07/27/2016-08/01/2016 due to symptomatic anemia secondary to right thigh hematoma which was sustained after a mechanical fall on anticoagulation with supra therapeutic INR. He was transfused and stabilized. Plan was to hold Coumadin for approximately 10 days. He was discharged to [facility] skilled nursing facility.Prior to late January admission patient lived with wife very independent and active prior to admission. Wife is of good health and can also assist as  well as multiple family members.Presented 08/02/2016 after a fall while ambulating to the bathroom at the SNF rehab.He denied any chest pain dizziness or loss of consciousness. Complains of left hip pain. Cranial CT scan showed mild diffuse cortical atrophy. No acute intracranial abnormality. X-rays and imaging revealed left hip femoral neck fracture. Noted hemoglobin findings of 7.7 he was transfused preoperatively. Patient received cardiac clearance with echocardiogram showing ejection fraction of 55% no wall motion abnormalities and underwent anterior approach left total hip arthroplasty 08/05/2016 per Dr. Alvan Dame. Weightbearing as tolerated left lower extremity. Initially his chronic Coumadin for atrial fibrillation was resumed. Patient with acute delirium postoperatively. MRI of the brain 08/06/2016 showed new subdural collection around the left cerebral convexity measuring up to 10 mm in thickness signal consistent with hygroma. Left cerebral mass effect was mild. No postoperative infarct. Neurosurgery Dr. Ellene Route consulted and advised conservative care. Patient was cleared to begin low dose aspirin 81 mg daily but no Coumadin therapy. Close monitoring of hemoglobin latest of 9.0.   Past Medical History  Past Medical History:  Diagnosis Date  . Atrial fibrillation (HCC)    Chronic  . BPH (benign prostatic hypertrophy)   . Edema extremities 06/04/2014  .  Elevated cholesterol   . Fractured femoral neck ([place]) 08/02/2016   CLOSED FRACTURE  . Glaucoma   . Gout   . Hernia   . Herniated disc   . Hypertension   . Melanoma ([place])   . Melanoma ([place])   . Thrombocytopenia ([place])     Family History  family history includes Hypertension in his mother; Prostate cancer in his father.  Prior Rehab/Hospitalizations:  Has the patient had major surgery during 100 days prior to admission? No  Recent admit to [facility] after fall on ice. D/c'd to [facility] where he had a fall up in bathroom and readmitted less than  24 hrs later.  Current Medications   Current Facility-Administered Medications:  .  0.9 %  sodium chloride infusion, 250 mL, Intravenous, PRN, Waldemar Dickens, MD, Last Rate: 50 mL/hr at 08/07/16 0500, 250 mL at 08/07/16 0500 .  0.9 %  sodium chloride infusion, , Intravenous, Once, Waldemar Dickens, MD .  acetaminophen (TYLENOL) tablet 1,000 mg, 1,000 mg, Oral, Q8H, 1,000 mg at 08/07/16 1329 **OR** acetaminophen (TYLENOL) suppository 650 mg, 650 mg, Rectal, Q8H, Paralee Cancel, MD .  allopurinol (ZYLOPRIM) tablet 300 mg, 300 mg, Oral, Q1200, Waldemar Dickens, MD, 300 mg at 08/08/16 1111 .  aspirin EC tablet 81 mg, 81 mg, Oral, Daily, [employer], DO, 81 mg at 08/08/16 1111 .  digoxin (LANOXIN) tablet 0.125 mg, 0.125 mg, Oral, Daily, Waldemar Dickens, MD, 0.125 mg at 08/08/16 1111 .  diltiazem (CARDIZEM CD) 24 hr capsule 240 mg, 240 mg, Oral, Q1200, Waldemar Dickens, MD, 240 mg at 08/08/16 1111 .  docusate sodium (COLACE) capsule 100 mg, 100 mg, Oral, BID, Waldemar Dickens, MD, 100 mg at 08/07/16 1028 .  ferrous sulfate tablet 325 mg, 325 mg, Oral, TID PC, Danae Orleans, PA-C, 325 mg at 08/08/16 1112 .  finasteride (PROSCAR) tablet 5 mg, 5 mg, Oral, QHS, Waldemar Dickens, MD, 5 mg at 08/06/16 2232 .  hydrochlorothiazide (HYDRODIURIL) tablet 25 mg, 25 mg, Oral, Daily, Waldemar Dickens, MD, 25 mg at 08/08/16 1111 .  hydrOXYzine (ATARAX/VISTARIL) tablet 10 mg, 10 mg, Oral, TID PRN, Bertram Savin Sheikh, DO, 10 mg at 08/04/16 2325 .  lactose free nutrition (BOOST PLUS) liquid 237 mL, 237 mL, Oral, TID BM, Waldemar Dickens, MD, 237 mL at 08/08/16 1000 .  latanoprost (XALATAN) 0.005 % ophthalmic solution 1 drop, 1 drop, Both Eyes, QHS, Waldemar Dickens, MD, 1 drop at 08/07/16 2112 .  lisinopril (PRINIVIL,ZESTRIL) tablet 10 mg, 10 mg, Oral, Daily, Omair Latif Sheikh, DO, 10 mg at 08/08/16 1112 .  magnesium oxide (MAG-OX) tablet 400 mg, 400 mg, Oral, Q1200, Waldemar Dickens, MD, 400 mg at 08/08/16 1110 .   menthol-cetylpyridinium (CEPACOL) lozenge 3 mg, 1 lozenge, Oral, PRN **OR** phenol (CHLORASEPTIC) mouth spray 1 spray, 1 spray, Mouth/Throat, PRN, Danae Orleans, PA-C .  metoCLOPramide (REGLAN) tablet 5-10 mg, 5-10 mg, Oral, Q8H PRN **OR** metoCLOPramide (REGLAN) injection 5-10 mg, 5-10 mg, Intravenous, Q8H PRN, Danae Orleans, PA-C .  omega-3 acid ethyl esters (LOVAZA) capsule 1 g, 1 g, Oral, Q1200, Waldemar Dickens, MD, 1 g at 08/08/16 1111 .  ondansetron (ZOFRAN) tablet 4 mg, 4 mg, Oral, Q6H PRN **OR** ondansetron (ZOFRAN) injection 4 mg, 4 mg, Intravenous, Q6H PRN, Waldemar Dickens, MD .  phosphorus (K PHOS NEUTRAL) tablet 500 mg, 500 mg, Oral, TID, Omair Latif Sheikh, DO, 500 mg at 08/08/16 1111 .  polyethylene glycol (MIRALAX / GLYCOLAX)  packet 17 g, 17 g, Oral, Daily PRN, Danae Orleans, PA-C .  sodium chloride flush (NS) 0.9 % injection 3 mL, 3 mL, Intravenous, Q12H, Waldemar Dickens, MD, 3 mL at 08/08/16 1000 .  sodium chloride flush (NS) 0.9 % injection 3 mL, 3 mL, Intravenous, PRN, Waldemar Dickens, MD .  tamsulosin [facility]) capsule 0.4 mg, 0.4 mg, Oral, Daily, Waldemar Dickens, MD, 0.4 mg at 08/08/16 1110  Patients Current Diet: Diet regular Room service appropriate? Yes; Fluid consistency: Thin  Precautions / Restrictions Precautions Precautions: Fall Precaution Comments: h/o recent falls.  No history of falls prior to 07/24/16 Restrictions Weight Bearing Restrictions: Yes LLE Weight Bearing: Weight bearing as tolerated   Has the patient had 2 or more falls or a fall with injury in the past year?Yes Fell at home on the ice 07/24/16 and discharged home, readmitted due to mobility issues, d/c'd to SNF and fell in less than 24 hrs with hip fx and returned to [facility]  Prior Activity Level Community (5-7x/wk): very active and independent pta, goes outside the home into the community daily. Pt handles all financial matters, stock markets, Social research officer, government. pta  Home Assistive Devices / [place] Devices/Equipment: Radio producer (specify quad or straight), Eyeglasses, Environmental consultant (specify type) Home Equipment: Walker - 2 wheels, Other (comment)  Prior Device Use: Indicate devices/aids used by the patient prior to current illness, exacerbation or injury? None of the above  Prior Functional Level Prior Function Level of Independence: Independent Comments: prior to recent falls, pt was very independent.  He was active in the community, rides his elliptical daily, enjoys watching and tracking the stock market and is an avid reader   Self Care: Did the patient need help bathing, dressing, using the toilet or eating?  Independent  Indoor Mobility: Did the patient need assistance with walking from room to room (with or without device)? Independent  Stairs: Did the patient need assistance with internal or external stairs (with or without device)? Independent  Functional Cognition: Did the patient need help planning regular tasks such as shopping or remembering to take medications? Independent  Current Functional Level Cognition  Overall Cognitive Status: Impaired/Different from baseline Current Attention Level: Selective Orientation Level: Oriented to person, Oriented to time, Disoriented to place, Disoriented to situation Following Commands: Follows multi-step commands inconsistently Safety/Judgement: Decreased awareness of safety General Comments: Pt more appropriate today.  Still with some confusion     Extremity Assessment (includes Sensation/Coordination)  Upper Extremity Assessment: Defer to OT evaluation  Lower Extremity Assessment: Generalized weakness, RLE deficits/detail RLE Deficits / Details: Grossly decr AROM and strength, limited by pain postop    ADLs  Overall ADL's : Needs assistance/impaired Eating/Feeding: Set up, Sitting Eating/Feeding Details (indicate cue type and reason): Per family, pt with poor appetite  Grooming: Wash/dry hands, Wash/dry face, Oral care,  Brushing hair, Set up, Sitting Upper Body Bathing: Minimal assistance, Sitting Lower Body Bathing: Moderate assistance, Sit to/from stand Lower Body Bathing Details (indicate cue type and reason): mod A  Upper Body Dressing : Sitting, Minimal assistance Lower Body Dressing: Moderate assistance, Sit to/from stand Lower Body Dressing Details (indicate cue type and reason): Pt able to doff socks.  he requires max A to don them, and wife reports she does this for him at home.  Anticipate he will be able to don pants with mod A  Toilet Transfer: Minimal assistance, +2 for safety/equipment, Ambulation, Regular Toilet, BSC, Grab bars, RW Toileting- Clothing Manipulation and Hygiene: Maximal assistance, Sit  to/from stand Toileting - Water quality scientist Details (indicate cue type and reason): Pt had incontintent episode of diarrhea and still with some residual stool on peri area.  Assisted him with peri care in standing  Functional mobility during ADLs: Minimal assistance, +2 for safety/equipment, Rolling walker General ADL Comments: pt more appropriate and engaged this date.  He continues to complain of dizziness, but reports it is improved.     Mobility  Overal bed mobility: Needs Assistance Bed Mobility: Supine to Sit Sidelying to sit: Mod assist, +2 for safety/equipment Supine to sit: Min assist, HOB elevated Sit to supine: Min assist, +2 for safety/equipment General bed mobility comments: Pt requires assist to lift trunk from bed and mod cues for technique and hand placement.  HOB elevated and pt relying heavily on railings for leverage at trunk.     Transfers  Overall transfer level: Needs assistance Equipment used: Rolling walker (2 wheeled) Transfers: Sit to/from Stand Sit to Stand: +2 safety/equipment, Min guard, From elevated surface Stand pivot transfers: Min assist General transfer comment: Min guard assist for balance and stability of RW during transitions from elevated bed. Second  person for safety, but ultimately not needed.      Ambulation / Gait / Stairs / Wheelchair Mobility  Ambulation/Gait Ambulation/Gait assistance: +2 safety/equipment, Min assist Ambulation Distance (Feet): 65 Feet Assistive device: Rolling walker (2 wheeled) Gait Pattern/deviations: Step-to pattern, Antalgic, Trunk flexed General Gait Details: Pt needed min assist for balance (less posterior preference today), and assist to steer RW.  Second person follwed with chair to encourage increased gait distance and safety.  Verbal cues for upright posture.  Gait velocity: decreased Gait velocity interpretation: Below normal speed for age/gender    Posture / Balance Dynamic Sitting Balance Sitting balance - Comments: Pt able to lean forward to access feet to doff socks  Balance Overall balance assessment: Needs assistance Sitting-balance support: Feet supported Sitting balance-Leahy Scale: Good Sitting balance - Comments: Pt able to lean forward to access feet to doff socks  Standing balance support: Bilateral upper extremity supported Standing balance-Leahy Scale: Poor Standing balance comment: Pt reliant on UE support and min guard assist.  Stood for 5 mins while OT preformed total assist peri care after pt had a BM earlier.     Special needs/care consideration BiPAP/CPAP  N/a CPM  N/a Continuous Drip IV  N/a Dialysis  N/a Life Vest  N/a Oxygen  N/a Special Bed  N/a Trach Size  N/a Wound Vac (area)  N/a Skin ecchymosis  To buttocks, hips arms and back                        Bowel mgmt: LBM 2/5 continent Bladder mgmt: continent Diabetic mgmt  N/a Family at times has Charity fundraiser in hospital after 7 pm so they feel comfortable to go home and rest   Previous Home Environment Living Arrangements: Spouse/significant other  Lives With: Spouse Available Help at Discharge: Family, Available 24 hours/day Type of Home: House Home Layout: Two level, Able to live on main level with  bedroom/bathroom, Full bath on main level (his office is upstairs that he uses daily) Alternate Level Stairs-Rails: Left Alternate Level Stairs-Number of Steps: a flight upstairs then a landing and then 4 steps Home Access: Stairs to enter Entrance Stairs-Rails: Right, Left, Can reach both Entrance Stairs-Number of Steps:  (thats the front door; thru garage  its 2 steps with rail on ) Bathroom Shower/Tub: Walk-in shower (step into walk  in with grab bars on either side of entry, on) Bathroom Toilet: Standard Bathroom Accessibility: Yes How Accessible: Accessible via walker Sullivan: No Type of Home Care Services: Housekeeping Additional Comments: Pt has very supportive family   Discharge Living Setting Plans for Discharge Living Setting: Patient's home, Lives with (comment) (wife) Type of Home at Discharge: House Discharge Home Layout: Two level, Able to live on main level with bedroom/bathroom, Full bath on main level (office is upstairs that he uses daily) Alternate Level Stairs-Rails: Left Alternate Level Stairs-Number of Steps: flight then a landing then 4 steps Discharge Home Access: Stairs to enter Entrance Stairs-Rails:  (throughh front door rails on both sides far apart, through g) Entrance Stairs-Number of Steps: 4 Discharge Bathroom Shower/Tub: Walk-in shower (one step into shower with a door. bilateral grab bars for e) Discharge Bathroom Toilet: Standard Discharge Bathroom Accessibility: Yes How Accessible: Accessible via walker Does the patient have any problems obtaining your medications?: No  Social/Family/Support Systems Patient Roles: Spouse, Parent, Volunteer Contact Information: Pamala Hurry, wife and three local children very involved Anticipated Caregiver: wife, daughters and hired asisst if needed Anticipated Ambulance person Information: see above Ability/Limitations of Caregiver: wife 44 years old and very independent and active Caregiver Availability:  24/7 Discharge Plan Discussed with Primary Caregiver: Yes Is Caregiver In Agreement with Plan?: Yes Does Caregiver/Family have Issues with Lodging/Transportation while Pt is in Rehab?: No  Goals/Additional Needs Patient/Family Goal for Rehab: supervision with PT, OT, and SLP Expected length of stay: ELOS 10- 14 days Pt/Family Agrees to Admission and willing to participate: Yes Program Orientation Provided & Reviewed with Pt/Caregiver Including Roles  & Responsibilities: Yes  Decrease burden of Care through IP rehab admission: n/a  Possible need for SNF placement upon discharge: not anticipated  Patient Condition: This patient's condition remains as documented in the consult dated 08/06/2016, in which the Rehabilitation Physician determined and documented that the patient's condition is appropriate for intensive rehabilitative care in an inpatient rehabilitation facility. Will admit to inpatient rehab today.  Preadmission Screen Completed By:  Cleatrice Burke, 08/08/2016 12:13 PM ______________________________________________________________________   Discussed status with Dr. Naaman Plummer on 08/08/2016 at  1213 and received telephone approval for admission today.  Admission Coordinator:  Cleatrice Burke, time [id number] Date 08/08/2016

## 2016-08-08 ENCOUNTER — Inpatient Hospital Stay (HOSPITAL_COMMUNITY)
Admission: RE | Admit: 2016-08-08 | Discharge: 2016-08-15 | DRG: 560 | Disposition: A | Discharge status: Home Health | Payer: Medicare Other | Source: Intra-hospital | Attending: Physical Medicine & Rehabilitation | Admitting: Physical Medicine & Rehabilitation

## 2016-08-08 ENCOUNTER — Encounter (HOSPITAL_COMMUNITY): Payer: Self-pay

## 2016-08-08 DIAGNOSIS — Z471 Aftercare following joint replacement surgery: Secondary | ICD-10-CM | POA: Diagnosis not present

## 2016-08-08 DIAGNOSIS — Z85820 Personal history of malignant melanoma of skin: Secondary | ICD-10-CM | POA: Diagnosis not present

## 2016-08-08 DIAGNOSIS — N400 Enlarged prostate without lower urinary tract symptoms: Secondary | ICD-10-CM

## 2016-08-08 DIAGNOSIS — E8809 Other disorders of plasma-protein metabolism, not elsewhere classified: Secondary | ICD-10-CM

## 2016-08-08 DIAGNOSIS — D638 Anemia in other chronic diseases classified elsewhere: Secondary | ICD-10-CM

## 2016-08-08 DIAGNOSIS — R638 Other symptoms and signs concerning food and fluid intake: Secondary | ICD-10-CM | POA: Diagnosis not present

## 2016-08-08 DIAGNOSIS — E785 Hyperlipidemia, unspecified: Secondary | ICD-10-CM

## 2016-08-08 DIAGNOSIS — I959 Hypotension, unspecified: Secondary | ICD-10-CM

## 2016-08-08 DIAGNOSIS — M109 Gout, unspecified: Secondary | ICD-10-CM | POA: Diagnosis not present

## 2016-08-08 DIAGNOSIS — D72829 Elevated white blood cell count, unspecified: Secondary | ICD-10-CM | POA: Diagnosis not present

## 2016-08-08 DIAGNOSIS — M79609 Pain in unspecified limb: Secondary | ICD-10-CM | POA: Diagnosis not present

## 2016-08-08 DIAGNOSIS — N390 Urinary tract infection, site not specified: Secondary | ICD-10-CM

## 2016-08-08 DIAGNOSIS — D62 Acute posthemorrhagic anemia: Secondary | ICD-10-CM

## 2016-08-08 DIAGNOSIS — R630 Anorexia: Secondary | ICD-10-CM

## 2016-08-08 DIAGNOSIS — S72002A Fracture of unspecified part of neck of left femur, initial encounter for closed fracture: Secondary | ICD-10-CM | POA: Diagnosis not present

## 2016-08-08 DIAGNOSIS — B9620 Unspecified Escherichia coli [E. coli] as the cause of diseases classified elsewhere: Secondary | ICD-10-CM

## 2016-08-08 DIAGNOSIS — D649 Anemia, unspecified: Secondary | ICD-10-CM

## 2016-08-08 DIAGNOSIS — Z96642 Presence of left artificial hip joint: Secondary | ICD-10-CM

## 2016-08-08 DIAGNOSIS — K5900 Constipation, unspecified: Secondary | ICD-10-CM | POA: Diagnosis not present

## 2016-08-08 DIAGNOSIS — I950 Idiopathic hypotension: Secondary | ICD-10-CM

## 2016-08-08 DIAGNOSIS — E46 Unspecified protein-calorie malnutrition: Secondary | ICD-10-CM | POA: Diagnosis not present

## 2016-08-08 DIAGNOSIS — H409 Unspecified glaucoma: Secondary | ICD-10-CM

## 2016-08-08 DIAGNOSIS — D181 Lymphangioma, any site: Secondary | ICD-10-CM | POA: Diagnosis not present

## 2016-08-08 DIAGNOSIS — G4700 Insomnia, unspecified: Secondary | ICD-10-CM

## 2016-08-08 DIAGNOSIS — I482 Chronic atrial fibrillation: Secondary | ICD-10-CM | POA: Diagnosis not present

## 2016-08-08 DIAGNOSIS — F19921 Other psychoactive substance use, unspecified with intoxication with delirium: Secondary | ICD-10-CM

## 2016-08-08 DIAGNOSIS — Z87891 Personal history of nicotine dependence: Secondary | ICD-10-CM

## 2016-08-08 DIAGNOSIS — R319 Hematuria, unspecified: Secondary | ICD-10-CM | POA: Diagnosis not present

## 2016-08-08 DIAGNOSIS — Z79899 Other long term (current) drug therapy: Secondary | ICD-10-CM | POA: Diagnosis not present

## 2016-08-08 DIAGNOSIS — E876 Hypokalemia: Secondary | ICD-10-CM

## 2016-08-08 DIAGNOSIS — I10 Essential (primary) hypertension: Secondary | ICD-10-CM | POA: Diagnosis not present

## 2016-08-08 DIAGNOSIS — I480 Paroxysmal atrial fibrillation: Secondary | ICD-10-CM

## 2016-08-08 DIAGNOSIS — R0989 Other specified symptoms and signs involving the circulatory and respiratory systems: Secondary | ICD-10-CM

## 2016-08-08 DIAGNOSIS — Z96652 Presence of left artificial knee joint: Secondary | ICD-10-CM | POA: Diagnosis not present

## 2016-08-08 LAB — CBC WITH DIFFERENTIAL/PLATELET
BASOS ABS: 0 10*3/uL (ref 0.0–0.1)
BASOS PCT: 0 %
Eosinophils Absolute: 0.2 10*3/uL (ref 0.0–0.7)
Eosinophils Relative: 2 %
HEMATOCRIT: 26.1 % — AB (ref 39.0–52.0)
Hemoglobin: 8.2 g/dL — ABNORMAL LOW (ref 13.0–17.0)
Lymphocytes Relative: 6 %
Lymphs Abs: 0.5 10*3/uL — ABNORMAL LOW (ref 0.7–4.0)
MCH: 29.3 pg (ref 26.0–34.0)
MCHC: 31.4 g/dL (ref 30.0–36.0)
MCV: 93.2 fL (ref 78.0–100.0)
MONO ABS: 0.8 10*3/uL (ref 0.1–1.0)
Monocytes Relative: 9 %
NEUTROS ABS: 7.4 10*3/uL (ref 1.7–7.7)
NEUTROS PCT: 83 %
Platelets: 169 10*3/uL (ref 150–400)
RBC: 2.8 MIL/uL — ABNORMAL LOW (ref 4.22–5.81)
RDW: 16.9 % — AB (ref 11.5–15.5)
WBC: 8.9 10*3/uL (ref 4.0–10.5)

## 2016-08-08 LAB — COMPREHENSIVE METABOLIC PANEL
ALBUMIN: 2.1 g/dL — AB (ref 3.5–5.0)
ALT: 12 U/L — ABNORMAL LOW (ref 17–63)
AST: 20 U/L (ref 15–41)
Alkaline Phosphatase: 58 U/L (ref 38–126)
Anion gap: 11 (ref 5–15)
BILIRUBIN TOTAL: 1.7 mg/dL — AB (ref 0.3–1.2)
BUN: 23 mg/dL — AB (ref 6–20)
CHLORIDE: 109 mmol/L (ref 101–111)
CO2: 22 mmol/L (ref 22–32)
Calcium: 7.9 mg/dL — ABNORMAL LOW (ref 8.9–10.3)
Creatinine, Ser: 0.87 mg/dL (ref 0.61–1.24)
GFR calc Af Amer: >60 mL/min (ref >60)
GFR calc non Af Amer: >60 mL/min (ref >60)
GLUCOSE: 92 mg/dL (ref 65–99)
POTASSIUM: 3.6 mmol/L (ref 3.5–5.1)
Sodium: 142 mmol/L (ref 135–145)
Total Protein: 4.4 g/dL — ABNORMAL LOW (ref 6.5–8.1)

## 2016-08-08 LAB — PROTIME-INR
INR: 1.25
PROTHROMBIN TIME: 15.7 s — AB (ref 11.4–15.2)

## 2016-08-08 LAB — PHOSPHORUS: Phosphorus: 2.2 mg/dL — ABNORMAL LOW (ref 2.5–4.6)

## 2016-08-08 LAB — MAGNESIUM: Magnesium: 1.9 mg/dL (ref 1.7–2.4)

## 2016-08-08 MED ORDER — FERROUS SULFATE 325 (65 FE) MG PO TABS
325.0000 mg | ORAL_TABLET | Freq: Three times a day (TID) | ORAL | Status: DC
  Administered 2016-08-08 – 2016-08-15 (×17): 325 mg via ORAL
  Filled 2016-08-08 (×20): qty 1

## 2016-08-08 MED ORDER — LISINOPRIL 10 MG PO TABS
10.0000 mg | ORAL_TABLET | Freq: Every day | ORAL | Status: DC
  Administered 2016-08-08: 10 mg via ORAL
  Filled 2016-08-08: qty 1

## 2016-08-08 MED ORDER — FERROUS SULFATE 325 (65 FE) MG PO TABS: 325.0000 mg | ORAL_TABLET | Freq: Three times a day (TID) | ORAL | 0 refills | Status: DC

## 2016-08-08 MED ORDER — DIGOXIN 125 MCG PO TABS
0.1250 mg | ORAL_TABLET | Freq: Every day | ORAL | Status: DC
  Administered 2016-08-09 – 2016-08-15 (×7): 0.125 mg via ORAL
  Filled 2016-08-08 (×6): qty 1

## 2016-08-08 MED ORDER — ASPIRIN EC 81 MG PO TBEC
81.0000 mg | DELAYED_RELEASE_TABLET | Freq: Every day | ORAL | Status: DC
  Administered 2016-08-09 – 2016-08-15 (×7): 81 mg via ORAL
  Filled 2016-08-08 (×7): qty 1

## 2016-08-08 MED ORDER — TAMSULOSIN HCL 0.4 MG PO CAPS
0.4000 mg | ORAL_CAPSULE | Freq: Every day | ORAL | Status: DC
  Administered 2016-08-09 – 2016-08-15 (×7): 0.4 mg via ORAL
  Filled 2016-08-08 (×7): qty 1

## 2016-08-08 MED ORDER — ONDANSETRON HCL 4 MG/2ML IJ SOLN: 4.0000 mg | Freq: Four times a day (QID) | INTRAMUSCULAR | Status: DC | PRN

## 2016-08-08 MED ORDER — ACETAMINOPHEN 325 MG PO TABS
325.0000 mg | ORAL_TABLET | ORAL | Status: DC | PRN
  Administered 2016-08-09 – 2016-08-14 (×8): 650 mg via ORAL
  Filled 2016-08-08 (×9): qty 2

## 2016-08-08 MED ORDER — LATANOPROST 0.005 % OP SOLN
1.0000 [drp] | Freq: Every day | OPHTHALMIC | Status: DC
  Administered 2016-08-08 – 2016-08-14 (×7): 1 [drp] via OPHTHALMIC
  Filled 2016-08-08 (×2): qty 2.5

## 2016-08-08 MED ORDER — BOOST PLUS PO LIQD
237.0000 mL | Freq: Three times a day (TID) | ORAL | Status: DC
Start: 2016-08-08 — End: 2016-08-15
  Administered 2016-08-09 – 2016-08-14 (×7): 237 mL via ORAL
  Filled 2016-08-08 (×24): qty 237

## 2016-08-08 MED ORDER — MAGNESIUM OXIDE 400 (241.3 MG) MG PO TABS
400.0000 mg | ORAL_TABLET | Freq: Every day | ORAL | Status: DC
  Administered 2016-08-09 – 2016-08-14 (×6): 400 mg via ORAL
  Filled 2016-08-08 (×7): qty 1

## 2016-08-08 MED ORDER — HYDROXYZINE HCL 10 MG PO TABS: 10.0000 mg | ORAL_TABLET | Freq: Three times a day (TID) | ORAL | 0 refills | Status: DC | PRN

## 2016-08-08 MED ORDER — K PHOS MONO-SOD PHOS DI & MONO 155-852-130 MG PO TABS
500.0000 mg | ORAL_TABLET | Freq: Three times a day (TID) | ORAL | Status: DC
  Administered 2016-08-08 – 2016-08-15 (×20): 500 mg via ORAL
  Filled 2016-08-08 (×21): qty 2

## 2016-08-08 MED ORDER — ASPIRIN 81 MG PO TBEC
81.0000 mg | DELAYED_RELEASE_TABLET | Freq: Every day | ORAL | 0 refills | Status: AC
Start: 2016-08-09 — End: ?

## 2016-08-08 MED ORDER — ONDANSETRON HCL 4 MG PO TABS: 4.0000 mg | ORAL_TABLET | Freq: Four times a day (QID) | ORAL | Status: DC | PRN

## 2016-08-08 MED ORDER — DOCUSATE SODIUM 100 MG PO CAPS
100.0000 mg | ORAL_CAPSULE | Freq: Two times a day (BID) | ORAL | Status: DC
  Administered 2016-08-09 – 2016-08-15 (×8): 100 mg via ORAL
  Filled 2016-08-08 (×13): qty 1

## 2016-08-08 MED ORDER — POLYETHYLENE GLYCOL 3350 17 G PO PACK: 17.0000 g | PACK | Freq: Every day | ORAL | 0 refills | Status: AC | PRN

## 2016-08-08 MED ORDER — OMEGA-3-ACID ETHYL ESTERS 1 G PO CAPS
1.0000 g | ORAL_CAPSULE | Freq: Every day | ORAL | Status: DC
Start: 2016-08-09 — End: 2016-08-15
  Administered 2016-08-09 – 2016-08-14 (×6): 1 g via ORAL
  Filled 2016-08-08 (×6): qty 1

## 2016-08-08 MED ORDER — LISINOPRIL 10 MG PO TABS
10.0000 mg | ORAL_TABLET | Freq: Every day | ORAL | Status: DC
  Administered 2016-08-09 – 2016-08-15 (×5): 10 mg via ORAL
  Filled 2016-08-08 (×6): qty 1

## 2016-08-08 MED ORDER — ACETAMINOPHEN 500 MG PO TABS: 1000.0000 mg | ORAL_TABLET | Freq: Three times a day (TID) | ORAL | 0 refills | Status: AC

## 2016-08-08 MED ORDER — FINASTERIDE 5 MG PO TABS
5.0000 mg | ORAL_TABLET | Freq: Every day | ORAL | Status: DC
  Administered 2016-08-08 – 2016-08-13 (×6): 5 mg via ORAL
  Filled 2016-08-08 (×7): qty 1

## 2016-08-08 MED ORDER — ALLOPURINOL 100 MG PO TABS
300.0000 mg | ORAL_TABLET | Freq: Every day | ORAL | Status: DC
  Administered 2016-08-09 – 2016-08-15 (×7): 300 mg via ORAL
  Filled 2016-08-08 (×6): qty 3

## 2016-08-08 MED ORDER — SORBITOL 70 % SOLN: 30.0000 mL | Freq: Every day | Status: DC | PRN

## 2016-08-08 MED ORDER — HYDROCHLOROTHIAZIDE 25 MG PO TABS
25.0000 mg | ORAL_TABLET | Freq: Every day | ORAL | Status: DC
  Administered 2016-08-09 – 2016-08-15 (×6): 25 mg via ORAL
  Filled 2016-08-08 (×6): qty 1

## 2016-08-08 MED ORDER — DILTIAZEM HCL ER COATED BEADS 240 MG PO CP24
240.0000 mg | ORAL_CAPSULE | Freq: Every day | ORAL | Status: DC
  Administered 2016-08-09 – 2016-08-15 (×6): 240 mg via ORAL
  Filled 2016-08-08 (×7): qty 1

## 2016-08-08 MED ORDER — POLYETHYLENE GLYCOL 3350 17 G PO PACK: 17.0000 g | PACK | Freq: Every day | ORAL | Status: DC | PRN

## 2016-08-08 NOTE — Progress Notes (Signed)
Patient ID: Isaac Villa, male   DOB: [birth year], 83 y.o.   MRN: [id number] Pt arrived on unit approximately 1600 with Rn, nurse tech and spouse. He also brought several tote bags with him that included pajamas, slippers, etc. Oriented to unit. Call bell in reach.  [name]. [name], RN

## 2016-08-08 NOTE — Clinical Social Work Note (Signed)
CSW consulted for New SNF. Per report, pt was admitted from [facility]. PT is now recommending CIR. CSW spoke with CIR Admissions Coordinator, and pt will admit to inpatient rehab when cleared medically. CSW is signing off as no further needs identified.   Darden Dates, MSW, LCSW  Clinical Social Worker  [phone]

## 2016-08-08 NOTE — Discharge Summary (Signed)
Physician Discharge Summary  Isaac Villa [id number] DOB: January 03, 1925 DOA: 81/31/2018  PCP: [name], MD  Admit date: 08/02/2016 Discharge date: 08/08/2016  Admitted From: SNF Disposition:  CIR  Recommendations for Outpatient Follow-up:  1. Follow up with PCP in 1-2 weeks 2. Follow up with Orthopedic Surgery Dr. Alvan Dame in 2 weeks 3. Follow up with Cardiology Dr. Radford Pax as an outpatient for Atrial Fibrillation and Mildly dilated Aortic Root; Will need CTA done 4. Follow up with Dr. Marcine Matar in Neurosurgery as an outpatient and have repeat Head imaging done 5. Please obtain BMP/CBC in one week  Home Health: No Equipment/Devices: Rolling Walker with 5" wheels; 3 in 1 Bedside Commode   Discharge Condition: Stable CODE STATUS: FULL Diet recommendation: Heart Healthy   Brief/Interim Summary: Isaac Villa a 83 y.o.malewith medical history significant of atrophic fibrillation, BPH, HLD, glaucoma, gout, HTN, melanoma. Of note patient may recently admitted for approximately 5 day stay due to symptomatic anemia secondary to right thigh hematoma which was sustained after a mechanical fall on anticoagulation with supratherapeutic INR. Patient had been discharged from the hospital for 24 hours prior to readmission. Patient reports ambulating across the tile floor in an effort to use the bathroom when his feet slipped from underneath him. Patient fell to the ground striking his left hip. Unsure if he struck his head but denies any specific focal pain complaints outside of his left hip, chest pain, palpitations, nausea, vomiting, shortness of breath, fevers, abdominal pain, dysuria, frequency, back pain. Pain in left hip is constant. Worse with movement. Admitted for Left Hip Fracture and transfused 2 units of pRBC's. Was altered after fall and delirious but redirectable. Cardiology consulted after pre-operative ECHO showed mild dilation of Aortic Root. Cardiology Cleared patient for Sugery.  Orthopedics took patient to surgery on 08/05/16. MRI of brain was done and showed Hygroma so Neurosurgery was consulted and was recommended to stop Coumadin and start ASA 81. Dr. Ellene Route saw the patient yesterday AM and recommended observation of the hygroma but advising against anticoagulation. Discussed with Dr. Alvan Dame and he is ok with transitioning the patient to 81 mg of ASA. Patient is medically stable at this point to be D/C'd to CIR and will follow up care with the Physician there. Will need outpatient CTA and reimaging of head eventually.   Discharge Diagnoses:  Active Problems:   Chronic atrial fibrillation (HCC)   Essential hypertension, benign   Symptomatic anemia   Traumatic hematoma of buttock   Hip fracture (HCC)   BPH (benign prostatic hyperplasia)   Protein calorie malnutrition (HCC)   Protein-calorie malnutrition, severe  Acute Left Hip fracture s/p Left Total Hip Replacement POD 3 -sustained after mechanical fall. Patient is total hip arthroplasty w/ anterior approach on 08/05/16 by Dr. Alvan Dame.  - Discontinued Coumadin and transitioned to ASA 81 mg per Neurosurgery Recc's; Informed Orthopedics and ok with switch  - S/p 2 units PRBC per ortho request - Foley D/C'd and patient doing well - Pain mgt per Ortho; Acetaminophen 1000 mg q8h - Air overlay mattress - PT/OT after surgical repair. Given pts frequent falls due to deconditioning and now hip fracture, CIR may be appropriate for pt once able.  -Dr. Alysia Penna of CIR evaluated and is candidate for CIR and will D/C today -Echo showed Definity used; normal LV function; mild AI; mildly dilated aortic root (4.5 cm); suggest CTA or MRA to further assess; biatrialenlargement; mild RVE; mild TR; small pericardial effusion. -CTA can be done as an outpatient  per Cardiology -ECHO shows no active CHF -Per Ortho Up with Therapy if able to cooperate; [facility] Delirium, improved -Resolved; Likely in part to pain and  Benzodiazepines and Opioids. -Avoid Sedating Medications -Bed Hygiene -Delirum Precautions -MRI Done and showed subdural Hygroma -Continue with Redirection as necessary -Patient completely lucid and alert and awake and oriented x3  Subdural Hygroma -MRI showed New subdural collection around the left cerebral convexity measuring up to 10 mm in thickness, signal most consistent with hygroma. Left cerebral mass effect is mild. CT follow-up is recommended. No postoperative infarct. IIncidental tectal lipoma. -DISCUSSED With Neurosurgeon Dr. Ellene Route who recommended stopping Coumadin and starting on ASA 81 mg.  -Do not want conversion of subdural hygroma to convert to subdural hematoma -Continue to observe as it will likely resolve on its own -Repeat Head Imaging as an outpatient and follow up with Dr. Ellene Route as an outpatient   Leukocytosis -Improved -WBC went from 12.2 -> 21.6 -> 16.8 -> 14.1 -> 8.9 -Likely wasd reactive in the setting of Hip Surgery -Continue to Monitor S/Sx of Infection; If worsens Pan Culture and obtain CXR and repeat UA -Patient Afebrile -Repeat CBC as an outpatient   Anemia:  -7.7 on admission. Now 8.2  likely post operative change from Hip Surgery -s/p 2 units PRBC per ortho; Still has large right ecchymosis and hematoma -C/w Ferrous Sulfate 325 mg po TID -CBC at CIR -No signs/symptoms of bleeding will   Afib: chronic. Rate controlled - Continue Diltiazem, Digoxin - Coumadin restarted by Ortho but now D/C'd per Neurosurgery Recc's. Started ASA 81 mg now; Discussed with Orthopedics Dr. Alvan Dame about D/C'ing Coumadin and he is agreement of switch - Cardiology was concerned about full anticoagulation and recent falls and recommended not restarting Warfarin and using ASA 81 mg po instead if stability was a concern; Given the new hygroma will D/C - Cardiology not worried about pauses unless greater than 3 secs; Has had runs of [facility] but asymptomatic - Echo as above -  Follow up with Primary Cardiologist Dr. Radford Pax as an outpatient   HTN: -Continue Home Diltiazem, HCTZ, -Restarted Lisinopriltoday  BPH: - Continue Finasteride 5 mg po Daily and with Tamsulosisn 0.4 mg po Daily  Acute gouty arthritis:  -Was stated on Prednisone during last admission for R knee swelling and pain.  -Improved w/ steroids. 5 day course finished on 08/04/16 -Continue Allopurinol 300 mg po Daily  Severe Protein Calorie malnutrition: - Appreciate Nutrition consult - Pre-albumin was low - C/w Feeding supplement with Boost Plus 237 mL po TIDwm  Sinus Pause -Pause was 2.25 seconds. Continue to Monitor for greater than 3 seconds -Will check Electrolytes and repletes -Continue to Monitor closely; will hold Digoxin and Diltiazem if continues  Mildly Dilated Aortic Root -Discussed with Cardiology and CTA can be done as an outpatient -Follow up with Dr. Radford Pax  Discharge Instructions  Discharge Instructions    Call MD for:  difficulty breathing, headache or visual disturbances    Complete by:  As directed    Call MD for:  extreme fatigue    Complete by:  As directed    Call MD for:  persistant dizziness or light-headedness    Complete by:  As directed    Call MD for:  persistant nausea and vomiting    Complete by:  As directed    Call MD for:  redness, tenderness, or signs of infection (pain, swelling, redness, odor or green/yellow discharge around incision site)    Complete  by:  As directed    Call MD for:  severe uncontrolled pain    Complete by:  As directed    Call MD for:  temperature >100.4    Complete by:  As directed    Diet - low sodium heart healthy    Complete by:  As directed    Discharge instructions    Complete by:  As directed    Follow up Care at CIR   Increase activity slowly    Complete by:  As directed      Allergies as of 08/08/2016      Reactions   Penicillins Anaphylaxis, Other (See Comments)   SYNCOPE > "Causes pt to pass out " Has  patient had a PCN reaction causing immediate rash, facial/tongue/throat swelling, SOB or lightheadedness with hypotension:  #  #  #  YES  #  #  #  Has patient had a PCN reaction causing severe rash involving mucus membranes or skin necrosis: No Has patient had a PCN reaction that required hospitalization #  #  #  YES  #  #  #  Has patient had a PCN reaction occurring within the last 10 years: No   Enoxaparin Other (See Comments)   REACTION IS DOSE RELATED >>"Internal bleeding"      Medication List    STOP taking these medications   acetaminophen 650 MG CR tablet Commonly known as:  TYLENOL Replaced by:  acetaminophen 500 MG tablet   HYDROcodone-acetaminophen 5-325 MG tablet Commonly known as:  NORCO/VICODIN   predniSONE 20 MG tablet Commonly known as:  DELTASONE   warfarin 4 MG tablet Commonly known as:  COUMADIN     TAKE these medications   acetaminophen 500 MG tablet Commonly known as:  TYLENOL Take 2 tablets (1,000 mg total) by mouth every 8 (eight) hours. Replaces:  acetaminophen 650 MG CR tablet   allopurinol 300 MG tablet Commonly known as:  ZYLOPRIM Take 300 mg by mouth daily at 12 noon.   aspirin 81 MG EC tablet Take 1 tablet (81 mg total) by mouth daily. Start taking on:  08/09/2016   Calcium + D3 600-200 MG-UNIT Tabs Take 1 tablet by mouth daily at 12 noon.   digoxin 0.125 MG tablet Commonly known as:  LANOXIN Take 0.125 mg by mouth daily.   diltiazem 240 MG 24 hr capsule Commonly known as:  CARDIZEM CD Take 240 mg by mouth daily at 12 noon.   docusate sodium 100 MG capsule Commonly known as:  COLACE Take 1 capsule (100 mg total) by mouth 2 (two) times daily.   ferrous sulfate 325 (65 FE) MG tablet Take 1 tablet (325 mg total) by mouth 3 (three) times daily after meals.   finasteride 5 MG tablet Commonly known as:  PROSCAR Take 5 mg by mouth at bedtime.   hydrochlorothiazide 25 MG tablet Commonly known as:  HYDRODIURIL Take 25 mg by mouth  daily.   hydrOXYzine 10 MG tablet Commonly known as:  ATARAX/VISTARIL Take 1 tablet (10 mg total) by mouth 3 (three) times daily as needed for anxiety.   lactose free nutrition Liqd Take 237 mLs by mouth 3 (three) times daily between meals.   latanoprost 0.005 % ophthalmic solution Commonly known as:  XALATAN Place 1 drop into both eyes at bedtime.   lisinopril 10 MG tablet Commonly known as:  PRINIVIL,ZESTRIL Take 1 tablet (10 mg total) by mouth daily.   MAGNESIUM-OXIDE 400 (241.3 Mg) MG tablet Generic drug:  magnesium  oxide Take 400 mg by mouth daily at 12 noon.   multivitamin with minerals Tabs tablet Take 1 tablet by mouth daily at 12 noon.   omega-3 acid ethyl esters 1 g capsule Commonly known as:  LOVAZA Take 1 g by mouth daily at 12 noon.   polyethylene glycol packet Commonly known as:  MIRALAX / GLYCOLAX Take 17 g by mouth daily as needed for mild constipation.   potassium chloride SA 20 MEQ tablet Commonly known as:  K-DUR,KLOR-CON Take 20 mEq by mouth 2 (two) times daily.   tamsulosin 0.4 MG Caps capsule Commonly known as:  FLOMAX Take 0.4 mg by mouth daily.      Follow-up Information    Mauri Pole, MD. Schedule an appointment as soon as possible for a visit in 2 weeks.   Specialty:  Orthopedic Surgery Contact information: [address] [place] [id number] [phone]          Allergies  Allergen Reactions  . Penicillins Anaphylaxis and Other (See Comments)    SYNCOPE > "Causes pt to pass out "  Has patient had a PCN reaction causing immediate rash, facial/tongue/throat swelling, SOB or lightheadedness with hypotension:  #  #  #  YES  #  #  #  Has patient had a PCN reaction causing severe rash involving mucus membranes or skin necrosis: No Has patient had a PCN reaction that required hospitalization #  #  #  YES  #  #  #  Has patient had a PCN reaction occurring within the last 10 years: No  . Enoxaparin Other (See  Comments)    REACTION IS DOSE RELATED >>"Internal bleeding"   Consultations:  Orthopedic Surgery  Cardiology   Neurosurgery  Procedures/Studies: Ct Head Wo Contrast  Result Date: 08/02/2016 CLINICAL DATA:  Head injury after fall. EXAM: CT HEAD WITHOUT CONTRAST TECHNIQUE: Contiguous axial images were obtained from the base of the skull through the vertex without intravenous contrast. COMPARISON:  CT scan of July 24, 2016. FINDINGS: Brain: Mild diffuse cortical atrophy is noted. Minimal chronic ischemic white matter disease is noted. No mass effect or midline shift is noted. Ventricular size is within normal limits. There is no evidence of mass lesion, hemorrhage or acute infarction. Vascular: Atherosclerosis of carotid siphons is noted. Skull: Normal. Negative for fracture or focal lesion. Sinuses/Orbits: No acute finding. Other: None. IMPRESSION: Mild diffuse cortical atrophy. Minimal chronic ischemic white matter disease. No acute intracranial abnormality seen. Electronically Signed   By: Marijo Conception, M.D.   On: 08/02/2016 13:43   Ct Head Wo Contrast  Result Date: 07/24/2016 CLINICAL DATA:  Recent slip and fall with head injury and blood thinner usage, initial encounter EXAM: CT HEAD WITHOUT CONTRAST TECHNIQUE: Contiguous axial images were obtained from the base of the skull through the vertex without intravenous contrast. COMPARISON:  None. FINDINGS: Brain: Diffuse atrophic changes and chronic white matter ischemic changes noted. No findings to suggest acute hemorrhage, acute infarction or space-occupying mass lesion are noted. Vascular: No hyperdense vessel or unexpected calcification. Skull: Normal. Negative for fracture or focal lesion. Sinuses/Orbits: No acute finding. Other: None. IMPRESSION: Atrophic changes and chronic white matter ischemic change. No acute abnormality is noted. Electronically Signed   By: Inez Catalina M.D.   On: 07/24/2016 16:27   Ct Pelvis Wo Contrast  Result  Date: 07/27/2016 CLINICAL DATA:  Right thigh and buttock hematoma following a fall on ice 3 days ago. EXAM: CT PELVIS WITHOUT CONTRAST TECHNIQUE: Multidetector  CT imaging of the pelvis was performed following the standard protocol without intravenous contrast. COMPARISON:  07/24/2016. FINDINGS: Urinary Tract: Stable partially included right renal cyst and left renal complex cyst or solid mass. Enlarged prostate gland protruding into the base of the urinary bladder with associated mild diffuse bladder wall thickening. Bowel: Extensive sigmoid colon diverticulosis. No dilated bowel loops. Vascular/Lymphatic: Atheromatous arterial calcifications. Reproductive: Stable moderately to markedly enlarged prostate gland with mild heterogeneity. Other: Proximal left inguinal hernia containing fat and a portion of the sigmoid colon without obstruction. Progressive diffuse subcutaneous edema on the right. The previously demonstrated 10.2 x 6.0 cm right gluteus muscle hematoma currently measures 9.9 x 6.5 cm. Musculoskeletal: Extensive lower lumbar spine facet degenerative changes and right L4 pars defect with associated grade 1 anterolisthesis at the L4-5 level. Mild right hip degenerative changes. Minimal left hip degenerative changes. No fracture or dislocation. IMPRESSION: 1. No fracture or dislocation. 2. Stable right gluteus muscle hematoma with increased associated subcutaneous edema. 3. Proximal left inguinal hernia containing fat and a small portion of the sigmoid colon without obstruction. 4. Stable moderately to markedly enlarged prostate gland with mild heterogeneity. 5. Mild diffuse bladder wall thickening compatible with chronic bladder outlet obstruction by the enlarged prostate gland. 6. Stable partially included left renal complex cyst or solid mass. This could be further evaluated with a pre and postcontrast CT or MRI if clinically indicated. 7. Lower lumbar spine degenerative changes and right L4 pars defect  with associated grade 1 anterolisthesis at the L4-5 level. Electronically Signed   By: Claudie Revering M.D.   On: 07/27/2016 18:24   Ct Pelvis Wo Contrast  Result Date: 07/24/2016 CLINICAL DATA:  Slipped on ice at [employer], with acute onset of right hip pain. Initial encounter. EXAM: CT PELVIS WITHOUT CONTRAST TECHNIQUE: Multidetector CT imaging of the pelvis was performed following the standard protocol without intravenous contrast. COMPARISON:  None. FINDINGS: Urinary Tract: The bladder is mildly distended and grossly unremarkable in appearance, with impression on the base of bladder from the patient's significantly enlarged prostate. Bilateral renal cysts are seen, including a somewhat complex 4.9 cm left renal cyst with a slightly thickened wall and peripheral calcification. As this has very gradually grown from 1.8 cm in 2009, renal ultrasound or CT with contrast would be helpful for further evaluation, as deemed clinically appropriate. Bowel: Scattered diverticulosis is noted along the visualized portions of the colon, without evidence of diverticulitis. Vascular/Lymphatic: Scattered calcification is seen along the distal abdominal aorta and its branches. No inguinal or pelvic sidewall lymphadenopathy is seen. Enlarged retroperitoneal nodes measure up to 1.3 cm in short axis, raising question for underlying malignancy. Reproductive: The prostate is significantly enlarged, measuring 6.0 cm in transverse dimension, with minimal calcification. Other: There is a large soft tissue hematoma within the right gluteus musculature, with overlying bruising. The hematoma measures nearly 14 x 10 x 6 cm in size. Musculoskeletal: There is no definite evidence of fracture. Mild degenerative change is noted at the lower lumbar spine, with underlying chronic right-sided pars defect at L4, and mild grade 1 anterolisthesis of L4 on L5. The remainder of the visualized musculature is grossly unremarkable in appearance.  IMPRESSION: 1. No definite evidence of fracture at this time. 2. Large soft tissue hematoma at the right gluteus musculature, with overlying soft tissue bruising. The hematoma measures nearly 14 x 10 x 6 cm in size. 3. Enlarged retroperitoneal nodes, measuring up to 1.3 cm in short axis, raising question for underlying malignancy. Further  evaluation could be considered as deemed clinically appropriate. 4. Significantly enlarged prostate.  Would correlate with PSA. 5. Somewhat complex 4.9 cm left renal cystic lesion demonstrates slightly thickened wall and peripheral calcification, and has very gradually grown from 1.8 cm in 2009. Renal ultrasound or CT with contrast would be helpful for further evaluation, if deemed clinically appropriate. 6. Chronic right-sided pars defect at L4, with mild grade 1 anterolisthesis of L4 on L5. Electronically Signed   By: Garald Balding M.D.   On: 07/24/2016 16:40   Mr Brain Wo Contrast  Result Date: 08/06/2016 CLINICAL DATA:  Confusion since hip surgery February 3rd. EXAM: MRI HEAD WITHOUT CONTRAST TECHNIQUE: Multiplanar, multiecho pulse sequences of the brain and surrounding structures were obtained without intravenous contrast. COMPARISON:  08/02/2016 head CT FINDINGS: Brain: Interval development of subdural collection around the left cerebral hemisphere which has MR features most compatible with hygroma. The collection measures up to 10 mm in thickness in the frontal region, mass effect on the left cerebral hemisphere is mild in the setting of atrophy. At baseline the septum pellucidum is right of midline, and this appearance is mildly accentuated with the mass-effect. No brain edema. Incidental fatty mass along the right tectum measuring 7 mm. Mild generalized volume loss. Mild periventricular chronic microvascular ischemia. No acute infarct or hydrocephalus Vascular: Preserved flow voids Skull and upper cervical spine: Negative Sinuses/Orbits: Bilateral cataract resection.  No posttraumatic finding Other: These results will be called to the ordering clinician or representative by the Radiologist Assistant, and communication documented in the PACS or zVision Dashboard. IMPRESSION: 1. New subdural collection around the left cerebral convexity measuring up to 10 mm in thickness, signal most consistent with hygroma. Left cerebral mass effect is mild. CT follow-up is recommended. 2. No postoperative infarct. 3. Incidental tectal lipoma. Electronically Signed   By: Monte Fantasia M.D.   On: 08/06/2016 16:59   Dg Chest Port 1 View  Result Date: 07/30/2016 CLINICAL DATA:  Shortness of breath. EXAM: PORTABLE CHEST 1 VIEW COMPARISON:  11/07/2006 and prior chest radiographs FINDINGS: Cardiomegaly and mild pulmonary vascular congestion noted. Bibasilar atelectasis is identified. There is no evidence of focal airspace disease, pulmonary edema, suspicious pulmonary nodule/mass, pleural effusion, or pneumothorax. No acute bony abnormalities are identified. IMPRESSION: Cardiomegaly, mild pulmonary vascular congestion and bibasilar atelectasis. Electronically Signed   By: Margarette Canada M.D.   On: 07/30/2016 14:55   Dg C-arm 1-60 Min  Result Date: 08/05/2016 CLINICAL DATA:  Status post left total hip arthroplasty. EXAM: DG C-ARM 61-120 MIN; OPERATIVE LEFT HIP WITH PELVIS FLUOROSCOPY TIME:  23 seconds. COMPARISON:  Radiographs of August 02, 2016. FINDINGS: The femoral and acetabular components appear to be well situated. No fracture or dislocation is noted. Expected postoperative changes are seen in the surrounding soft tissues. IMPRESSION: Status post left total hip arthroplasty. Electronically Signed   By: Marijo Conception, M.D.   On: 08/05/2016 09:54   Dg Hip Port Unilat With Pelvis 1v Left  Result Date: 08/05/2016 CLINICAL DATA:  Status post left hip replacement EXAM: DG HIP (WITH OR WITHOUT PELVIS) 1V PORT LEFT COMPARISON:  None. FINDINGS: The acetabular and femoral components of the left  hip replacement are in good position. Soft tissue air is postoperative in nature. No other interval changes. IMPRESSION: Left hip replacement as above. Electronically Signed   By: Dorise Bullion III M.D   On: 08/05/2016 11:14   Dg Hip Operative Unilat W Or W/o Pelvis Left  Result Date: 08/05/2016 CLINICAL DATA:  Status post left total hip arthroplasty. EXAM: DG C-ARM 61-120 MIN; OPERATIVE LEFT HIP WITH PELVIS FLUOROSCOPY TIME:  23 seconds. COMPARISON:  Radiographs of August 02, 2016. FINDINGS: The femoral and acetabular components appear to be well situated. No fracture or dislocation is noted. Expected postoperative changes are seen in the surrounding soft tissues. IMPRESSION: Status post left total hip arthroplasty. Electronically Signed   By: Marijo Conception, M.D.   On: 08/05/2016 09:54   Dg Hip Unilat W Or Wo Pelvis 2-3 Views Left  Result Date: 08/02/2016 CLINICAL DATA:  [place] today.  Injured left hip. EXAM: DG HIP (WITH OR WITHOUT PELVIS) 2-3V LEFT COMPARISON:  CT pelvis 07/27/2016 FINDINGS: There is a displaced left femoral neck fracture with a varus deformity. The right hip is intact. The bony pelvis is intact. IMPRESSION: Displaced left femoral neck fracture. Electronically Signed   By: Marijo Sanes M.D.   On: 08/02/2016 12:25   Dg Hip Unilat  With Pelvis 2-3 Views Right  Result Date: 07/24/2016 CLINICAL DATA:  Right hip pain after a fall today. Initial encounter. EXAM: DG HIP (WITH OR WITHOUT PELVIS) 2-3V RIGHT COMPARISON:  None. FINDINGS: Osteopenia. No fracture. Minimal collar osteophytosis along the right femoral head and minimal subchondral sclerosis in the right acetabulum. IMPRESSION: 1. No fracture or dislocation. 2. Minimal degenerative change in the right hip. Electronically Signed   By: Lorin Picket M.D.   On: 07/24/2016 13:51   Dg Knee 2 Views Right  Result Date: 07/31/2016 CLINICAL DATA:  Medial right knee pain made worse with physical activity. Medial swelling. No known  injury. EXAM: RIGHT KNEE - 3 VIEW COMPARISON:  None in PACs FINDINGS: The bones are subjectively adequately mineralized. There is high-grade joint space loss of the medial compartment with a near bone on bone appearance. There is slight lateral subluxation of the tibial plateaus with respect to the femoral condyles. There is chondrocalcinosis of the menisci. There is beaking of the medial tibial spine. There are spurs arising from the articular margins of the tibial plateaus and the medial femoral condyle. There is moderate narrowing of the patellofemoral joint space. There may be an old transversely oriented fracture through the superior pole of the distal femur at the insertion of the quadriceps tendon. There is no definite acute effusion. There is some bony remodeling along the anterior superior articular surface of the distal femur. IMPRESSION: There is moderate severe osteoarthritic joint space loss with a near bone on bone appearance involving the medial compartment. Moderate degenerative patellofemoral joint space loss. Electronically Signed   By: David  Martinique M.D.   On: 07/31/2016 12:00    ECHOCARDIOGRAM Study Conclusions  - Left ventricle: The cavity size was normal. There was mild focal   basal hypertrophy of the septum. Systolic function was normal.   The estimated ejection fraction was in the range of 50% to 55%.   Wall motion was normal; there were no regional wall motion   abnormalities. - Ventricular septum: Septal motion showed abnormal function and   dyssynergy. - Aortic valve: There was mild regurgitation. - Aortic root: The aortic root was mildly dilated. - Mitral valve: Calcified annulus. - Left atrium: The atrium was severely dilated. - Right ventricle: The cavity size was mildly dilated. - Right atrium: The atrium was severely dilated. - Pulmonary arteries: PA peak pressure: 32 mm Hg (S). - Pericardium, extracardiac: A small pericardial effusion was    identified.  Impressions:  - Definity used; normal LV function; mild AI; mildly  dilated aortic   root (4.5 cm); suggest CTA or MRA to further assess; biatrial   enlargement; mild RVE; mild TR; small pericardial effusion.  Subjective: Seen and examined at bedside and he was sitting in the chair and had no active concerns or complaints. States he was appreciative for all the care he received. Ready to be D/C'd to CIR.   Discharge Exam: Vitals:   08/07/16 2216 08/08/16 0412  BP: (!) 129/53 130/66  Pulse: 65 73  Resp: 16 16  Temp: 98.5 F (36.9 C) 98.6 F (37 C)   Vitals:   08/07/16 0549 08/07/16 1500 08/07/16 2216 08/08/16 0412  BP:  (!) 110/58 (!) 129/53 130/66  Pulse:  75 65 73  Resp: 18 18 16 16   Temp:  98.4 F (36.9 C) 98.5 F (36.9 C) 98.6 F (37 C)  TempSrc:  Oral Oral Oral  SpO2:  96% 96% 97%   General: Pt is alert, awake, not in acute distress Cardiovascular: Irregularly Irregular Rhythm, S1/S2 +, no rubs, no gallops Respiratory: CTA bilaterally, no wheezing, no rhonchi; Patient not tachypenic or using any accessory muscles to breathe Abdominal: Soft, NT, ND, bowel sounds + Extremities: no edema, no cyanosis; Right leg ecchymosis  The results of significant diagnostics from this hospitalization (including imaging, microbiology, ancillary and laboratory) are listed below for reference.    Microbiology: Recent Results (from the past 240 hour(s))  Surgical PCR screen     Status: None   Collection Time: 08/03/16  4:00 AM  Result Value Ref Range Status   MRSA, PCR NEGATIVE NEGATIVE Final   Staphylococcus aureus NEGATIVE NEGATIVE Final    Comment:        The Xpert SA Assay (FDA approved for NASAL specimens in patients over 63 years of age), is one component of a comprehensive surveillance program.  Test performance has been validated by [facility] for patients greater than or equal to 50 year old. It is not intended to diagnose infection nor to guide or  monitor treatment.   Culture, Urine     Status: Abnormal   Collection Time: 08/04/16 11:40 AM  Result Value Ref Range Status   Specimen Description URINE, RANDOM  Final   Special Requests NONE  Final   Culture <10,000 COLONIES/mL INSIGNIFICANT GROWTH (A)  Final   Report Status 08/05/2016 FINAL  Final    Labs: BNP (last 3 results) No results for input(s): BNP in the last 8760 hours. Basic Metabolic Panel:  Recent Labs Lab 08/03/16 1917 08/04/16 0541 08/05/16 1127 08/06/16 0254 08/07/16 0415 08/08/16 0654  NA 139 139 139 140 140 142  K 4.2 4.3 4.2 4.2 3.8 3.6  CL 108 108 105 110 112* 109  CO2 25 24 23 23 25 22   GLUCOSE 103* 101* 131* 148* 100* 92  BUN 30* 24* 18 25* 31* 23*  CREATININE 0.82 0.88 0.87 0.92 0.96 0.87  CALCIUM 8.1* 8.1* 8.2* 7.9* 7.8* 7.9*  MG 2.0 2.0 1.9 2.0  --  1.9  PHOS 2.4* 2.2* 3.4 3.8  --  2.2*   Liver Function Tests:  Recent Labs Lab 08/03/16 0510 08/04/16 0541 08/05/16 1127 08/08/16 0654  AST 28 23 24 20   ALT 18 17 17  12*  ALKPHOS 44 58 59 58  BILITOT 1.8* 1.8* 2.3* 1.7*  PROT 4.2* 4.9* 4.9* 4.4*  ALBUMIN 1.9* 2.3* 2.3* 2.1*   No results for input(s): LIPASE, AMYLASE in the last 168 hours. No results for input(s): AMMONIA in the last 168 hours. CBC:  Recent Labs Lab 08/02/16 1255  08/04/16 0541 08/05/16 1127 08/06/16 0254 08/07/16 0415 08/08/16 0654  WBC 17.0*  < > 12.2* 21.6* 16.8* 14.1* 8.9  NEUTROABS 15.1*  --  10.4* 20.4*  --   --  7.4  HGB 7.7*  < > 10.3* 10.6* 9.3* 9.0* 8.2*  HCT 24.0*  < > 32.9* 33.5* 29.3* 28.2* 26.1*  MCV 93.0  < > 92.4 93.6 92.1 93.4 93.2  PLT 246  < > 228 224 227 201 169  < > = values in this interval not displayed. Cardiac Enzymes: No results for input(s): CKTOTAL, CKMB, CKMBINDEX, TROPONINI in the last 168 hours. BNP: Invalid input(s): POCBNP CBG: No results for input(s): GLUCAP in the last 168 hours. D-Dimer No results for input(s): DDIMER in the last 72 hours. Hgb A1c No results for  input(s): HGBA1C in the last 72 hours. Lipid Profile No results for input(s): CHOL, HDL, LDLCALC, TRIG, CHOLHDL, LDLDIRECT in the last 72 hours. Thyroid function studies No results for input(s): TSH, T4TOTAL, T3FREE, THYROIDAB in the last 72 hours.  Invalid input(s): FREET3 Anemia work up No results for input(s): VITAMINB12, FOLATE, FERRITIN, TIBC, IRON, RETICCTPCT in the last 72 hours. Urinalysis    Component Value Date/Time   COLORURINE YELLOW 08/04/2016 1140   APPEARANCEUR CLEAR 08/04/2016 1140   LABSPEC 1.016 08/04/2016 1140   PHURINE 6.0 08/04/2016 1140   GLUCOSEU NEGATIVE 08/04/2016 1140   HGBUR NEGATIVE 08/04/2016 [place] 08/04/2016 1140   KETONESUR NEGATIVE 08/04/2016 1140   PROTEINUR NEGATIVE 08/04/2016 1140   NITRITE NEGATIVE 08/04/2016 1140   LEUKOCYTESUR NEGATIVE 08/04/2016 1140   Sepsis Labs Invalid input(s): PROCALCITONIN,  WBC,  LACTICIDVEN Microbiology Recent Results (from the past 240 hour(s))  Surgical PCR screen     Status: None   Collection Time: 08/03/16  4:00 AM  Result Value Ref Range Status   MRSA, PCR NEGATIVE NEGATIVE Final   Staphylococcus aureus NEGATIVE NEGATIVE Final    Comment:        The Xpert SA Assay (FDA approved for NASAL specimens in patients over 75 years of age), is one component of a comprehensive surveillance program.  Test performance has been validated by [facility] for patients greater than or equal to 91 year old. It is not intended to diagnose infection nor to guide or monitor treatment.   Culture, Urine     Status: Abnormal   Collection Time: 08/04/16 11:40 AM  Result Value Ref Range Status   Specimen Description URINE, RANDOM  Final   Special Requests NONE  Final   Culture <10,000 COLONIES/mL INSIGNIFICANT GROWTH (A)  Final   Report Status 08/05/2016 FINAL  Final   Time coordinating discharge: Over 30 minutes  SIGNED:  Kerney Elbe, DO [facility] 08/08/2016, 1:35 PM Pager  [phone]  If 7PM-7AM, please contact night-coverage [email or web address] Password [email or web address]

## 2016-08-08 NOTE — Progress Notes (Signed)
I have an inpt rehab bed available for this pt today. Pt and his wife are in agreement. I discussed with Dr. Alfredia Ferguson and he is in agreement. I will make the arrangements for today. RN CM and SW are aware. [phone]

## 2016-08-08 NOTE — Progress Notes (Signed)
Physical Therapy Treatment Patient Details Name: Isaac Villa MRN: [id number] DOB: October 22, 1924 Today's Date: 08/08/2016    History of Present Illness This 83 y.o. male admitted after slipping on tile floor at SNF (for rehab) while ambulating to BR.  He sustained Lt femur fx, and underwent THA direct anterior approach.   PMH includes recent falls.  He fell on ice and sustained hematoma, and was found to have supratherapeutic INR while on coumadin, he fell again at home due to symptomatic anemia (was discharged to SNF after thad recent admission); glaucoma, A-Fib, glaucoma, gout, HTN, melanoma     PT Comments    Pt is progressing well with mobility, but did need up to mod assist for LOB during gait with RW today.  He is down to one person assist and is progressing well with his LE HEP.  He is due to d/c to CIR today.  PT to follow acutely until d/c confirmed.     Follow Up Recommendations  CIR     Equipment Recommendations  Rolling walker with 5" wheels;3in1 (PT)    Recommendations for Other Services   NA     Precautions / Restrictions Precautions Precautions: Fall Precaution Comments: h/o recent falls.  No history of falls prior to 07/24/16 Restrictions Weight Bearing Restrictions: Yes LLE Weight Bearing: Weight bearing as tolerated    Mobility  Bed Mobility Overal bed mobility: Needs Assistance Bed Mobility: Sit to Supine       Sit to supine: Min assist   General bed mobility comments: Min assist to help lift left leg into the bed when returning to supine.  Pt attempting to use momentum, but unsuccessful in lifting the leg against gravity.   Transfers Overall transfer level: Needs assistance Equipment used: Rolling walker (2 wheeled) Transfers: Sit to/from Stand Sit to Stand: Min assist         General transfer comment: Min assist to support trunk for balance and stabilize RW during transition of hands to RW.  Verbal cues for safe hand placement.  Pt has to sit for  a few seconds once he lifts his trunk off of the back of the recliner chair. He reports dizziness at this time, but not when he goes to standing.  Per observation there is no obvious nystagmus, but pt has issues with visual testing due to complex command following.   Ambulation/Gait Ambulation/Gait assistance: Min assist;Mod assist Ambulation Distance (Feet): 85 Feet Assistive device: Rolling walker (2 wheeled) Gait Pattern/deviations: Step-through pattern;Antalgic Gait velocity: decreased   General Gait Details: Pt with mildly antalgic gait pattern, good, safe use of RW, pt reporting shakey legs and fatigue during gait.  No chair to follow today.  Pt had one LOB backwards requiring mod assist as he was turning to back up to recliner chair.            Balance Overall balance assessment: Needs assistance Sitting-balance support: Feet supported;Bilateral upper extremity supported;No upper extremity supported Sitting balance-Leahy Scale: Good     Standing balance support: Bilateral upper extremity supported Standing balance-Leahy Scale: Poor                      Cognition Arousal/Alertness: Awake/alert Behavior During Therapy: WFL for tasks assessed/performed Overall Cognitive Status: Impaired/Different from baseline Area of Impairment: Memory;Following commands;Problem solving   Current Attention Level: Selective Memory: Decreased short-term memory Following Commands: Follows multi-step commands inconsistently Safety/Judgement: Decreased awareness of safety   Problem Solving: Difficulty sequencing;Requires verbal cues;Requires tactile cues General  Comments: Continues to be more appropriate, STM deficits most prominant    Exercises Total Joint Exercises Ankle Circles/Pumps: AROM;Both;20 reps Quad Sets: AROM;Both;10 reps Short Arc Quad: AROM;Both;10 reps Heel Slides: AROM;AAROM;Left;Right;10 reps Hip ABduction/ADduction: AROM;AAROM;Right;Left;10 reps Long Arc Quad:  AROM;Both;5 reps        Pertinent Vitals/Pain Pain Assessment: Faces Faces Pain Scale: Hurts even more Pain Location: left hip with LE HEP Pain Descriptors / Indicators: Grimacing;Guarding Pain Intervention(s): Limited activity within patient's tolerance;Monitored during session;Repositioned    Home Living           Entrance Stairs-Rails: Right;Left;Can reach both Home Layout: Two level;Able to live on main level with bedroom/bathroom;Full bath on main level (his office is upstairs that he uses daily)            PT Goals (current goals can now be found in the care plan section) Acute Rehab PT Goals Patient Stated Goal: go to CIR and regain independence  Progress towards PT goals: Progressing toward goals    Frequency    Min 6X/week      PT Plan Current plan remains appropriate       End of Session   Activity Tolerance: Patient limited by pain;Patient limited by fatigue Patient left: in bed;with call bell/phone within reach;with family/visitor present     Time: 1216-1237 PT Time Calculation (min) (ACUTE ONLY): 21 min  Charges:  $Gait Training: 8-22 mins                      Verlin Duke B. [place], [place], DPT [phone]   08/08/2016, 12:47 PM

## 2016-08-08 NOTE — Progress Notes (Signed)
Cristina Gong, RN Rehab Admission Coordinator Signed Physical Medicine and Rehabilitation  PMR Pre-admission Date of Service: 08/07/2016 3:37 PM  Related encounter: ED to Hosp-Admission (Current) from 08/02/2016 in [place]       [] Hide copied text PMR Admission Coordinator Pre-Admission Assessment  Patient: Isaac Villa is an 83 y.o., male MRN: [id number] DOB: 04/20/1925 Height:   Weight:                                                                                                                                                    Insurance Information HMO:     PPO:      PCP:      IPA:      80/20: yes     OTHER:  No HMO PRIMARY: Medicare a and b      Policy#: [id number]      Subscriber: pt Benefits:  Phone #: passport one online     Name: 08/07/16 Eff. Date: 02/01/92     Deduct: $1340      Out of Pocket Max: none      Life Max: none CIR: 100%      SNF: 20 full days. Was at [facility] less then 24 hrs before readmitted Outpatient: 80%     Co-Pay: 20% Home Health: 100%      Co-Pay: none DME: 80%     Co-Pay: 20% Providers: pt choice  SECONDARY: [employer]      Policy#: [id number]      Subscriber: pt  Medicaid Application Date:       Case Manager:  Disability Application Date:       Case Worker:   Emergency Contact Information        Contact Information    Name Relation Home Work [place] Spouse [phone]  [phone]   Haught,Gail Daughter   [phone]   Kyan, Repke Daughter   [phone]   Torrez, Whack   [phone]     Current Medical History  Patient Admitting Diagnosis: fall with subsequent left hip fracture, status total hip arthroplasty  History of Present Illness:    : HPI: 83 year old right-handed male with past medical history of left TKA10 years ago, gouty arthritis, chronic atrial fibrillation maintained on Coumadin, hypertension, melanoma. Patient recently admitted  07/27/2016-08/01/2016 due to symptomatic anemia secondary to right thigh hematoma which was sustained after a mechanical fall on anticoagulation with supra therapeutic INR. He was transfused and stabilized. Plan was to hold Coumadin for approximately 10 days. He was discharged to [facility] skilled nursing facility.Prior to late January admission patient lived with wife very independent and active prior to admission. Wife is of good health and can also assist as well as multiple family members.Presented 08/02/2016 after a fall while ambulating to the bathroom at the SNF rehab.He denied any chest  pain dizziness or loss of consciousness. Complains of left hip pain. Cranial CT scan showed mild diffuse cortical atrophy. No acute intracranial abnormality. X-rays and imaging revealed left hip femoral neck fracture. Noted hemoglobin findings of 7.7 he was transfused preoperatively. Patient received cardiac clearance with echocardiogram showing ejection fraction of 55% no wall motion abnormalities and underwent anterior approach left total hip arthroplasty 08/05/2016 per Dr. Alvan Dame. Weightbearing as tolerated left lower extremity. Initially his chronic Coumadin for atrial fibrillation was resumed. Patient with acute delirium postoperatively. MRI of the brain 08/06/2016 showed new subdural collection around the left cerebral convexity measuring up to 10 mm in thickness signal consistent with hygroma. Left cerebral mass effect was mild. No postoperative infarct. Neurosurgery Dr. Ellene Route consulted and advised conservative care. Patient was cleared to begin low dose aspirin 81 mg daily but no Coumadin therapy. Close monitoring of hemoglobin latest of 9.0.   Past Medical History      Past Medical History:  Diagnosis Date  . Atrial fibrillation (HCC)    Chronic  . BPH (benign prostatic hypertrophy)   . Edema extremities 06/04/2014  . Elevated cholesterol   . Fractured femoral neck ([place]) 08/02/2016   CLOSED  FRACTURE  . Glaucoma   . Gout   . Hernia   . Herniated disc   . Hypertension   . Melanoma ([place])   . Melanoma ([place])   . Thrombocytopenia ([place])     Family History  family history includes Hypertension in his mother; Prostate cancer in his father.  Prior Rehab/Hospitalizations:  Has the patient had major surgery during 100 days prior to admission? No  Recent admit to [facility] after fall on ice. D/c'd to [facility] where he had a fall up in bathroom and readmitted less than 24 hrs later.  Current Medications   Current Facility-Administered Medications:  .  0.9 %  sodium chloride infusion, 250 mL, Intravenous, PRN, Waldemar Dickens, MD, Last Rate: 50 mL/hr at 08/07/16 0500, 250 mL at 08/07/16 0500 .  0.9 %  sodium chloride infusion, , Intravenous, Once, Waldemar Dickens, MD .  acetaminophen (TYLENOL) tablet 1,000 mg, 1,000 mg, Oral, Q8H, 1,000 mg at 08/07/16 1329 **OR** acetaminophen (TYLENOL) suppository 650 mg, 650 mg, Rectal, Q8H, Paralee Cancel, MD .  allopurinol (ZYLOPRIM) tablet 300 mg, 300 mg, Oral, Q1200, Waldemar Dickens, MD, 300 mg at 08/08/16 1111 .  aspirin EC tablet 81 mg, 81 mg, Oral, Daily, [employer], DO, 81 mg at 08/08/16 1111 .  digoxin (LANOXIN) tablet 0.125 mg, 0.125 mg, Oral, Daily, Waldemar Dickens, MD, 0.125 mg at 08/08/16 1111 .  diltiazem (CARDIZEM CD) 24 hr capsule 240 mg, 240 mg, Oral, Q1200, Waldemar Dickens, MD, 240 mg at 08/08/16 1111 .  docusate sodium (COLACE) capsule 100 mg, 100 mg, Oral, BID, Waldemar Dickens, MD, 100 mg at 08/07/16 1028 .  ferrous sulfate tablet 325 mg, 325 mg, Oral, TID PC, Danae Orleans, PA-C, 325 mg at 08/08/16 1112 .  finasteride (PROSCAR) tablet 5 mg, 5 mg, Oral, QHS, Waldemar Dickens, MD, 5 mg at 08/06/16 2232 .  hydrochlorothiazide (HYDRODIURIL) tablet 25 mg, 25 mg, Oral, Daily, Waldemar Dickens, MD, 25 mg at 08/08/16 1111 .  hydrOXYzine (ATARAX/VISTARIL) tablet 10 mg, 10 mg, Oral, TID PRN, Bertram Savin Sheikh, DO, 10 mg at  08/04/16 2325 .  lactose free nutrition (BOOST PLUS) liquid 237 mL, 237 mL, Oral, TID BM, Waldemar Dickens, MD, 237 mL at 08/08/16 1000 .  latanoprost (XALATAN)  0.005 % ophthalmic solution 1 drop, 1 drop, Both Eyes, QHS, Waldemar Dickens, MD, 1 drop at 08/07/16 2112 .  lisinopril (PRINIVIL,ZESTRIL) tablet 10 mg, 10 mg, Oral, Daily, Omair Latif Sheikh, DO, 10 mg at 08/08/16 1112 .  magnesium oxide (MAG-OX) tablet 400 mg, 400 mg, Oral, Q1200, Waldemar Dickens, MD, 400 mg at 08/08/16 1110 .  menthol-cetylpyridinium (CEPACOL) lozenge 3 mg, 1 lozenge, Oral, PRN **OR** phenol (CHLORASEPTIC) mouth spray 1 spray, 1 spray, Mouth/Throat, PRN, Danae Orleans, PA-C .  metoCLOPramide (REGLAN) tablet 5-10 mg, 5-10 mg, Oral, Q8H PRN **OR** metoCLOPramide (REGLAN) injection 5-10 mg, 5-10 mg, Intravenous, Q8H PRN, Danae Orleans, PA-C .  omega-3 acid ethyl esters (LOVAZA) capsule 1 g, 1 g, Oral, Q1200, Waldemar Dickens, MD, 1 g at 08/08/16 1111 .  ondansetron (ZOFRAN) tablet 4 mg, 4 mg, Oral, Q6H PRN **OR** ondansetron (ZOFRAN) injection 4 mg, 4 mg, Intravenous, Q6H PRN, Waldemar Dickens, MD .  phosphorus (K PHOS NEUTRAL) tablet 500 mg, 500 mg, Oral, TID, Omair Latif Sheikh, DO, 500 mg at 08/08/16 1111 .  polyethylene glycol (MIRALAX / GLYCOLAX) packet 17 g, 17 g, Oral, Daily PRN, Danae Orleans, PA-C .  sodium chloride flush (NS) 0.9 % injection 3 mL, 3 mL, Intravenous, Q12H, Waldemar Dickens, MD, 3 mL at 08/08/16 1000 .  sodium chloride flush (NS) 0.9 % injection 3 mL, 3 mL, Intravenous, PRN, Waldemar Dickens, MD .  tamsulosin [facility]) capsule 0.4 mg, 0.4 mg, Oral, Daily, Waldemar Dickens, MD, 0.4 mg at 08/08/16 1110  Patients Current Diet: Diet regular Room service appropriate? Yes; Fluid consistency: Thin  Precautions / Restrictions Precautions Precautions: Fall Precaution Comments: h/o recent falls.  No history of falls prior to 07/24/16 Restrictions Weight Bearing Restrictions: Yes LLE Weight Bearing: Weight  bearing as tolerated   Has the patient had 2 or more falls or a fall with injury in the past year?Yes Fell at home on the ice 07/24/16 and discharged home, readmitted due to mobility issues, d/c'd to SNF and fell in less than 24 hrs with hip fx and returned to [facility]  Prior Activity Level Community (5-7x/wk): very active and independent pta, goes outside the home into the community daily. Pt handles all financial matters, stock markets, Social research officer, government. pta  Home Assistive Devices / [place] Devices/Equipment: Radio producer (specify quad or straight), Eyeglasses, Environmental consultant (specify type) Home Equipment: Walker - 2 wheels, Other (comment)  Prior Device Use: Indicate devices/aids used by the patient prior to current illness, exacerbation or injury? None of the above  Prior Functional Level Prior Function Level of Independence: Independent Comments: prior to recent falls, pt was very independent.  He was active in the community, rides his elliptical daily, enjoys watching and tracking the stock market and is an avid reader   Self Care: Did the patient need help bathing, dressing, using the toilet or eating?  Independent  Indoor Mobility: Did the patient need assistance with walking from room to room (with or without device)? Independent  Stairs: Did the patient need assistance with internal or external stairs (with or without device)? Independent  Functional Cognition: Did the patient need help planning regular tasks such as shopping or remembering to take medications? Independent  Current Functional Level Cognition  Overall Cognitive Status: Impaired/Different from baseline Current Attention Level: Selective Orientation Level: Oriented to person, Oriented to time, Disoriented to place, Disoriented to situation Following Commands: Follows multi-step commands inconsistently Safety/Judgement: Decreased awareness of safety General Comments: Pt more appropriate today.  Still with some  confusion     Extremity Assessment (includes Sensation/Coordination)  Upper Extremity Assessment: Defer to OT evaluation  Lower Extremity Assessment: Generalized weakness, RLE deficits/detail RLE Deficits / Details: Grossly decr AROM and strength, limited by pain postop    ADLs  Overall ADL's : Needs assistance/impaired Eating/Feeding: Set up, Sitting Eating/Feeding Details (indicate cue type and reason): Per family, pt with poor appetite  Grooming: Wash/dry hands, Wash/dry face, Oral care, Brushing hair, Set up, Sitting Upper Body Bathing: Minimal assistance, Sitting Lower Body Bathing: Moderate assistance, Sit to/from stand Lower Body Bathing Details (indicate cue type and reason): mod A  Upper Body Dressing : Sitting, Minimal assistance Lower Body Dressing: Moderate assistance, Sit to/from stand Lower Body Dressing Details (indicate cue type and reason): Pt able to doff socks.  he requires max A to don them, and wife reports she does this for him at home.  Anticipate he will be able to don pants with mod A  Toilet Transfer: Minimal assistance, +2 for safety/equipment, Ambulation, Regular Toilet, BSC, Grab bars, RW Toileting- Clothing Manipulation and Hygiene: Maximal assistance, Sit to/from stand Toileting - Clothing Manipulation Details (indicate cue type and reason): Pt had incontintent episode of diarrhea and still with some residual stool on peri area.  Assisted him with peri care in standing  Functional mobility during ADLs: Minimal assistance, +2 for safety/equipment, Rolling walker General ADL Comments: pt more appropriate and engaged this date.  He continues to complain of dizziness, but reports it is improved.     Mobility  Overal bed mobility: Needs Assistance Bed Mobility: Supine to Sit Sidelying to sit: Mod assist, +2 for safety/equipment Supine to sit: Min assist, HOB elevated Sit to supine: Min assist, +2 for safety/equipment General bed mobility comments: Pt  requires assist to lift trunk from bed and mod cues for technique and hand placement.  HOB elevated and pt relying heavily on railings for leverage at trunk.     Transfers  Overall transfer level: Needs assistance Equipment used: Rolling walker (2 wheeled) Transfers: Sit to/from Stand Sit to Stand: +2 safety/equipment, Min guard, From elevated surface Stand pivot transfers: Min assist General transfer comment: Min guard assist for balance and stability of RW during transitions from elevated bed. Second person for safety, but ultimately not needed.      Ambulation / Gait / Stairs / Wheelchair Mobility  Ambulation/Gait Ambulation/Gait assistance: +2 safety/equipment, Min assist Ambulation Distance (Feet): 65 Feet Assistive device: Rolling walker (2 wheeled) Gait Pattern/deviations: Step-to pattern, Antalgic, Trunk flexed General Gait Details: Pt needed min assist for balance (less posterior preference today), and assist to steer RW.  Second person follwed with chair to encourage increased gait distance and safety.  Verbal cues for upright posture.  Gait velocity: decreased Gait velocity interpretation: Below normal speed for age/gender    Posture / Balance Dynamic Sitting Balance Sitting balance - Comments: Pt able to lean forward to access feet to doff socks  Balance Overall balance assessment: Needs assistance Sitting-balance support: Feet supported Sitting balance-Leahy Scale: Good Sitting balance - Comments: Pt able to lean forward to access feet to doff socks  Standing balance support: Bilateral upper extremity supported Standing balance-Leahy Scale: Poor Standing balance comment: Pt reliant on UE support and min guard assist.  Stood for 5 mins while OT preformed total assist peri care after pt had a BM earlier.     Special needs/care consideration BiPAP/CPAP  N/a CPM  N/a Continuous Drip IV  N/a Dialysis  N/a Life Vest  N/a Oxygen  N/a Special Bed  N/a Trach Size   N/a Wound Vac (area)  N/a Skin ecchymosis  To buttocks, hips arms and back                        Bowel mgmt: LBM 2/5 continent Bladder mgmt: continent Diabetic mgmt  N/a Family at times has Charity fundraiser in hospital after 7 pm so they feel comfortable to go home and rest   Previous Home Environment Living Arrangements: Spouse/significant other  Lives With: Spouse Available Help at Discharge: Family, Available 24 hours/day Type of Home: House Home Layout: Two level, Able to live on main level with bedroom/bathroom, Full bath on main level (his office is upstairs that he uses daily) Alternate Level Stairs-Rails: Left Alternate Level Stairs-Number of Steps: a flight upstairs then a landing and then 4 steps Home Access: Stairs to enter Entrance Stairs-Rails: Right, Left, Can reach both Entrance Stairs-Number of Steps:  (thats the front door; thru garage  its 2 steps with rail on ) Bathroom Shower/Tub: Walk-in shower (step into walk in with grab bars on either side of entry, on) Bathroom Toilet: Standard Bathroom Accessibility: Yes How Accessible: Accessible via walker Home Care Services: No Type of Home Care Services: Housekeeping Additional Comments: Pt has very supportive family   Discharge Living Setting Plans for Discharge Living Setting: Patient's home, Lives with (comment) (wife) Type of Home at Discharge: House Discharge Home Layout: Two level, Able to live on main level with bedroom/bathroom, Full bath on main level (office is upstairs that he uses daily) Alternate Level Stairs-Rails: Left Alternate Level Stairs-Number of Steps: flight then a landing then 4 steps Discharge Home Access: Stairs to enter Entrance Stairs-Rails:  (throughh front door rails on both sides far apart, through g) Entrance Stairs-Number of Steps: 4 Discharge Bathroom Shower/Tub: Walk-in shower (one step into shower with a door. bilateral grab bars for e) Discharge Bathroom Toilet:  Standard Discharge Bathroom Accessibility: Yes How Accessible: Accessible via walker Does the patient have any problems obtaining your medications?: No  Social/Family/Support Systems Patient Roles: Spouse, Parent, Volunteer Contact Information: Pamala Hurry, wife and three local children very involved Anticipated Caregiver: wife, daughters and hired asisst if needed Anticipated Ambulance person Information: see above Ability/Limitations of Caregiver: wife 64 years old and very independent and active Caregiver Availability: 24/7 Discharge Plan Discussed with Primary Caregiver: Yes Is Caregiver In Agreement with Plan?: Yes Does Caregiver/Family have Issues with Lodging/Transportation while Pt is in Rehab?: No  Goals/Additional Needs Patient/Family Goal for Rehab: supervision with PT, OT, and SLP Expected length of stay: [facility]OS 10- 14 days Pt/Family Agrees to Admission and willing to participate: Yes Program Orientation Provided & Reviewed with Pt/Caregiver Including Roles  & Responsibilities: Yes  Decrease burden of Care through IP rehab admission: n/a  Possible need for SNF placement upon discharge: not anticipated  Patient Condition: This patient's condition remains as documented in the consult dated 08/06/2016, in which the Rehabilitation Physician determined and documented that the patient's condition is appropriate for intensive rehabilitative care in an inpatient rehabilitation facility. Will admit to inpatient rehab today.  Preadmission Screen Completed By:  Cleatrice Burke, 08/08/2016 12:13 PM ______________________________________________________________________   Discussed status with Dr. Naaman Plummer on 08/08/2016 at  1213 and received telephone approval for admission today.  Admission Coordinator:  Cleatrice Burke, time [id number] Date 08/08/2016       Cosigned by: Meredith Staggers, MD at 08/08/2016 1:19 PM  Revision History

## 2016-08-08 NOTE — Progress Notes (Signed)
Isaac Blake, MD Physician Signed Physical Medicine and Rehabilitation  Consult Note Date of Service: 08/06/2016 12:00 PM  Related encounter: ED to Hosp-Admission (Current) from 08/02/2016 in [place] All Collapse All   [] Hide copied text [] Hover for attribution information Physical Medicine and Rehabilitation Consult Reason for Consult: Evaluate rehabilitation needs after a left hip femoral neck fracture Referring Phsyician: Isaac Villa is an 83 y.o. male.   HPI: Patient was very active, living at home up until 07/27/2016 when he fell and sustained a hematoma to his right hip. He has a history of chronic atrial fibrillation on chronic Coumadin. He was discharged to the skilled nursing facility on 08/01/2016. However, he had another fall in his room onto his left hip, sustaining a left femoral neck fracture. He was readmitted to [facility] 08/02/2016 and underwent blood transfusion, reversal of INR, as well as cardiology evaluation for operative clearance. Underwent anterior approach total hip arthroplasty on 08/05/2016. No postoperative PT, OT thus far  Review of Systems  Constitutional: Negative for chills and weight loss.  HENT: Negative for congestion.   Eyes: Negative for double vision, photophobia and redness.  Respiratory: Negative for cough, hemoptysis, shortness of breath and stridor.   Cardiovascular: Negative for chest pain, palpitations and leg swelling.  Gastrointestinal: Negative for abdominal pain, constipation, diarrhea, nausea and vomiting.  Genitourinary: Negative for dysuria and hematuria.  Musculoskeletal: Positive for falls and joint pain.  Skin: Negative for itching and rash.  Neurological: Positive for weakness. Negative for sensory change and headaches.  Endo/Heme/Allergies: Bruises/bleeds easily.  Psychiatric/Behavioral: Negative for hallucinations and substance abuse. The patient does not have  insomnia.         Past Medical History:  Diagnosis Date  . Atrial fibrillation (HCC)    Chronic  . BPH (benign prostatic hypertrophy)   . Edema extremities 06/04/2014  . Elevated cholesterol   . Fractured femoral neck ([place]) 08/02/2016   CLOSED FRACTURE  . Glaucoma   . Gout   . Hernia   . Herniated disc   . Hypertension   . Melanoma ([place])   . Melanoma ([place])   . Thrombocytopenia ([place])         Past Surgical History:  Procedure Laterality Date  . APPENDECTOMY    . CATARACT EXTRACTION Bilateral 04/2008  . EYE SURGERY     lens replacements  . HERNIA REPAIR    . JOINT REPLACEMENT     left knee  . [place] SURGERY  07/2004  . REPLACEMENT TOTAL KNEE Left 11/2006  . TONSILLECTOMY          Family History  Problem Relation Age of Onset  . Hypertension Mother   . Prostate cancer Father    Social History:  reports that he has quit smoking. He has never used smokeless tobacco. He reports that he drinks alcohol. He reports that he does not use drugs. Allergies:       Allergies  Allergen Reactions  . Penicillins Anaphylaxis and Other (See Comments)    SYNCOPE > "Causes pt to pass out "  Has patient had a PCN reaction causing immediate rash, facial/tongue/throat swelling, SOB or lightheadedness with hypotension:  #  #  #  YES  #  #  #  Has patient had a PCN reaction causing severe rash involving mucus membranes or skin necrosis: No Has patient had a PCN reaction that required hospitalization #  #  #  YES  #  #  #  Has patient had a PCN reaction occurring within the last 10 years: No  . Enoxaparin Other (See Comments)    REACTION IS DOSE RELATED >>"Internal bleeding"         Medications Prior to Admission  Medication Sig Dispense Refill  . acetaminophen (TYLENOL) 650 MG CR tablet Take 1,300 mg by mouth every 8 (eight) hours as needed for pain.    Marland Kitchen allopurinol (ZYLOPRIM) 300 MG tablet Take 300 mg by mouth daily at 12 noon.     .  Calcium Carb-Cholecalciferol (CALCIUM + D3) 600-200 MG-UNIT TABS Take 1 tablet by mouth daily at 12 noon.     . digoxin (LANOXIN) 0.125 MG tablet Take 0.125 mg by mouth daily.    Marland Kitchen diltiazem (CARDIZEM CD) 240 MG 24 hr capsule Take 240 mg by mouth daily at 12 noon.    . docusate sodium (COLACE) 100 MG capsule Take 1 capsule (100 mg total) by mouth 2 (two) times daily. 10 capsule 0  . finasteride (PROSCAR) 5 MG tablet Take 5 mg by mouth at bedtime.     . hydrochlorothiazide (HYDRODIURIL) 25 MG tablet Take 25 mg by mouth daily.    Marland Kitchen HYDROcodone-acetaminophen (NORCO/VICODIN) 5-325 MG tablet Take 1 tablet by mouth every 6 (six) hours as needed for moderate pain. 15 tablet 0  . lactose free nutrition (BOOST PLUS) LIQD Take 237 mLs by mouth 3 (three) times daily between meals. 90 Can 0  . latanoprost (XALATAN) 0.005 % ophthalmic solution Place 1 drop into both eyes at bedtime.     Marland Kitchen lisinopril (PRINIVIL,ZESTRIL) 10 MG tablet Take 1 tablet (10 mg total) by mouth daily. 90 tablet 2  . magnesium oxide (MAGNESIUM-OXIDE) 400 (241.3 Mg) MG tablet Take 400 mg by mouth daily at 12 noon.    . Multiple Vitamin (MULTIVITAMIN WITH MINERALS) TABS tablet Take 1 tablet by mouth daily at 12 noon.    Marland Kitchen omega-3 acid ethyl esters (LOVAZA) 1 g capsule Take 1 g by mouth daily at 12 noon.    . potassium chloride SA (K-DUR,KLOR-CON) 20 MEQ tablet Take 20 mEq by mouth 2 (two) times daily.    . predniSONE (DELTASONE) 20 MG tablet Take 2 tablets (40 mg total) by mouth daily. 8 tablet 0  . tamsulosin (FLOMAX) 0.4 MG CAPS capsule Take 0.4 mg by mouth daily.    Marland Kitchen warfarin (COUMADIN) 4 MG tablet Take 4 mg by mouth daily.  4    Home: Home Living Family/patient expects to be discharged to:: Private residence Living Arrangements: Spouse/significant other  Functional History: Functional Status:  Mobility:  ADL:  Cognition: Cognition Orientation Level: Oriented to person, Oriented to time,  Disoriented to place, Disoriented to situation  Blood pressure 132/61, pulse 92, temperature 99.4 F (37.4 C), temperature source Oral, resp. rate 18, SpO2 90 %. Physical Exam  Nursing note and vitals reviewed. Constitutional: He is oriented to person, place, and time. He appears well-developed and well-nourished.  HENT:  Head: Normocephalic and atraumatic.  Eyes: Conjunctivae and EOM are normal. Pupils are equal, round, and reactive to light.  Neck: Normal range of motion.  Cardiovascular: Normal rate and regular rhythm.   No murmur heard. Respiratory: Effort normal and breath sounds normal. No respiratory distress.  GI: Soft. Bowel sounds are normal. He exhibits no distension. There is no tenderness.  Neurological: He is alert and oriented to person, place, and time. He has normal reflexes.  Psychiatric: He has a normal mood  and affect.    Lab Results Last 24 Hours       Results for orders placed or performed during the hospital encounter of 08/02/16 (from the past 24 hour(s))  CBC     Status: Abnormal   Collection Time: 08/06/16  2:54 AM  Result Value Ref Range   WBC 16.8 (H) 4.0 - 10.5 K/uL   RBC 3.18 (L) 4.22 - 5.81 MIL/uL   Hemoglobin 9.3 (L) 13.0 - 17.0 g/dL   HCT 29.3 (L) 39.0 - 52.0 %   MCV 92.1 78.0 - 100.0 fL   MCH 29.2 26.0 - 34.0 pg   MCHC 31.7 30.0 - 36.0 g/dL   RDW 16.7 (H) 11.5 - 15.5 %   Platelets 227 150 - 400 K/uL  Basic metabolic panel     Status: Abnormal   Collection Time: 08/06/16  2:54 AM  Result Value Ref Range   Sodium 140 135 - 145 mmol/L   Potassium 4.2 3.5 - 5.1 mmol/L   Chloride 110 101 - 111 mmol/L   CO2 23 22 - 32 mmol/L   Glucose, Bld 148 (H) 65 - 99 mg/dL   BUN 25 (H) 6 - 20 mg/dL   Creatinine, Ser 0.92 0.61 - 1.24 mg/dL   Calcium 7.9 (L) 8.9 - 10.3 mg/dL   GFR calc non Af Amer >60 >60 mL/min   GFR calc Af Amer >60 >60 mL/min   Anion gap 7 5 - 15  Protime-INR     Status: Abnormal   Collection Time: 08/06/16   2:54 AM  Result Value Ref Range   Prothrombin Time 15.3 (H) 11.4 - 15.2 seconds   INR 1.20   Magnesium     Status: None   Collection Time: 08/06/16  2:54 AM  Result Value Ref Range   Magnesium 2.0 1.7 - 2.4 mg/dL  Phosphorus     Status: None   Collection Time: 08/06/16  2:54 AM  Result Value Ref Range   Phosphorus 3.8 2.5 - 4.6 mg/dL      Imaging Results (Last 48 hours)  Dg C-arm 1-60 Min  Result Date: 08/05/2016 CLINICAL DATA:  Status post left total hip arthroplasty. EXAM: DG C-ARM 61-120 MIN; OPERATIVE LEFT HIP WITH PELVIS FLUOROSCOPY TIME:  23 seconds. COMPARISON:  Radiographs of August 02, 2016. FINDINGS: The femoral and acetabular components appear to be well situated. No fracture or dislocation is noted. Expected postoperative changes are seen in the surrounding soft tissues. IMPRESSION: Status post left total hip arthroplasty. Electronically Signed   By: Marijo Conception, M.D.   On: 08/05/2016 09:54   Dg Hip Port Unilat With Pelvis 1v Left  Result Date: 08/05/2016 CLINICAL DATA:  Status post left hip replacement EXAM: DG HIP (WITH OR WITHOUT PELVIS) 1V PORT LEFT COMPARISON:  None. FINDINGS: The acetabular and femoral components of the left hip replacement are in good position. Soft tissue air is postoperative in nature. No other interval changes. IMPRESSION: Left hip replacement as above. Electronically Signed   By: Dorise Bullion III M.D   On: 08/05/2016 11:14   Dg Hip Operative Unilat W Or W/o Pelvis Left  Result Date: 08/05/2016 CLINICAL DATA:  Status post left total hip arthroplasty. EXAM: DG C-ARM 61-120 MIN; OPERATIVE LEFT HIP WITH PELVIS FLUOROSCOPY TIME:  23 seconds. COMPARISON:  Radiographs of August 02, 2016. FINDINGS: The femoral and acetabular components appear to be well situated. No fracture or dislocation is noted. Expected postoperative changes are seen in the surrounding soft tissues. IMPRESSION:  Status post left total hip arthroplasty. Electronically  Signed   By: Marijo Conception, M.D.   On: 08/05/2016 09:54     Assessment/Plan: Diagnosis: Left hip fracture, status post left total hip arthroplasty. 1. Does the need for close, 24 hr/day medical supervision in concert with the patient's rehab needs make it unreasonable for this patient to be served in a less intensive setting? Yes 2. Co-Morbidities requiring supervision/potential complications: Frequent falls, postoperative pain management, atrial fibrillation 3. Due to bladder management, bowel management, safety, skin/wound care, disease management, medication administration, pain management and patient education, does the patient require 24 hr/day rehab nursing? Yes 4. Does the patient require coordinated care of a physician, rehab nurse, PT (1-2 hrs/day, 5 days/week) and OT (1-2 hrs/day, 5 days/week) to address physical and functional deficits in the context of the above medical diagnosis(es)? Yes Addressing deficits in the following areas: balance, endurance, locomotion, strength, transferring, bowel/bladder control, bathing, dressing, feeding, grooming, toileting and psychosocial support 5. Can the patient actively participate in an intensive therapy program of at least 3 hrs of therapy per day at least 5 days per week? Potentially 6. The potential for patient to make measurable gains while on inpatient rehab is Pending PT eval 7. Anticipated functional outcomes upon discharge from inpatients are supervision PT, supervision OT, not applicable SLP 8. Estimated rehab length of stay to reach the above functional goals is: Tending PT eval 9. Does the patient have adequate social supports to accommodate these discharge functional goals? Potentially 10. Anticipated D/C setting: Home 11. Anticipated post D/C treatments: [place] therapy 12. Overall Rehab/Functional Prognosis: good  RECOMMENDATIONS: This patient's condition is appropriate for continued rehabilitative care in the following setting:  CIR versus SNF depending on ability to tolerate therapy Patient has agreed to participate in recommended program. Yes Note that insurance prior authorization may be required for reimbursement for recommended care.  Comment:   Isaac Villa 08/06/2016     Routing History

## 2016-08-08 NOTE — H&P (Signed)
Physical Medicine and Rehabilitation Admission H&P    Chief Complaint  Patient presents with  . Fall  . Hip Pain  : HPI: 83 year old right-handed male with past medical history of left TKA 10 years ago, gouty arthritis, chronic atrial fibrillation maintained on Coumadin, hypertension, melanoma. Patient recently admitted 07/27/2016-08/01/2016 due to symptomatic anemia secondary to right thigh hematoma which was sustained after a mechanical fall on anticoagulation with supratherapeutic INR. He was transfused and stabilized. Plan was to hold Coumadin for approximately 10 days. He was discharged to [facility] skilled nursing facility. Prior to late January admission patient lived with wife very independent and active prior to admission. Wife is of good health and can also assist as well as multiple family members. Presented 08/02/2016 after a fall while ambulating to the bathroom. He denied any chest pain dizziness or loss of consciousness. Complains of left hip pain. Cranial CT scan showed mild diffuse cortical atrophy. No acute intracranial abnormality. X-rays and imaging revealed left hip femoral neck fracture. Noted hemoglobin findings of 7.7 he was transfused preoperatively. Patient received cardiac clearance with echocardiogram showing ejection fraction of 55% no wall motion abnormalities and underwent anterior approach left total hip arthroplasty 08/05/2016 per Dr. Alvan Dame. Weightbearing as tolerated left lower extremity. Initially his chronic Coumadin for atrial fibrillation was resumed. Patient with acute delirium postoperatively. MRI of the brain 08/06/2016 showed new subdural collection around the left cerebral convexity measuring up to 10 mm in thickness signal consistent with hygroma. Left cerebral mass effect was mild. No postoperative infarct. Neurosurgery Dr. Ellene Route consulted and advised conservative care. Patient was cleared to begin low dose aspirin 81 mg daily but no Coumadin therapy.  Close monitoring of hemoglobin latest of 9.0. Physical and occupational therapy evaluations completed 08/06/2016 with recommendations of physical medicine rehabilitation consult. Patient was admitted for a comprehensive rehabilitation program  ROS Constitutional: Negative for fever.  HENT: Negative for hearing loss.   Eyes: Negative for blurred vision, double vision and discharge.  Respiratory: Negative for cough and shortness of breath.   Cardiovascular: Positive for palpitations. Negative for chest pain and leg swelling.  Gastrointestinal: Positive for constipation. Negative for nausea and vomiting.  Genitourinary: Positive for urgency. Negative for dysuria and flank pain.  Musculoskeletal: Positive for back pain, falls and myalgias.  Skin: Negative for rash.  Neurological: Positive for weakness. Negative for dizziness, seizures, loss of consciousness and headaches.  All other systems reviewed and are negative   Past Medical History:  Diagnosis Date  . Atrial fibrillation (HCC)    Chronic  . BPH (benign prostatic hypertrophy)   . Edema extremities 06/04/2014  . Elevated cholesterol   . Fractured femoral neck ([place]) 08/02/2016   CLOSED FRACTURE  . Glaucoma   . Gout   . Hernia   . Herniated disc   . Hypertension   . Melanoma ([place])   . Melanoma ([place])   . Thrombocytopenia ([place])    Past Surgical History:  Procedure Laterality Date  . APPENDECTOMY    . CATARACT EXTRACTION Bilateral 04/2008  . EYE SURGERY     lens replacements  . HERNIA REPAIR    . JOINT REPLACEMENT     left knee  . [place] SURGERY  07/2004  . REPLACEMENT TOTAL KNEE Left 11/2006  . TONSILLECTOMY     Family History  Problem Relation Age of Onset  . Hypertension Mother   . Prostate cancer Father    Social History:  reports that he has quit smoking. He has  never used smokeless tobacco. He reports that he drinks alcohol. He reports that he does not use drugs. Allergies:  Allergies  Allergen Reactions  .  Penicillins Anaphylaxis and Other (See Comments)    SYNCOPE > "Causes pt to pass out "  Has patient had a PCN reaction causing immediate rash, facial/tongue/throat swelling, SOB or lightheadedness with hypotension:  #  #  #  YES  #  #  #  Has patient had a PCN reaction causing severe rash involving mucus membranes or skin necrosis: No Has patient had a PCN reaction that required hospitalization #  #  #  YES  #  #  #  Has patient had a PCN reaction occurring within the last 10 years: No  . Enoxaparin Other (See Comments)    REACTION IS DOSE RELATED >>"Internal bleeding"   Medications Prior to Admission  Medication Sig Dispense Refill  . acetaminophen (TYLENOL) 650 MG CR tablet Take 1,300 mg by mouth every 8 (eight) hours as needed for pain.    Marland Kitchen allopurinol (ZYLOPRIM) 300 MG tablet Take 300 mg by mouth daily at 12 noon.     . Calcium Carb-Cholecalciferol (CALCIUM + D3) 600-200 MG-UNIT TABS Take 1 tablet by mouth daily at 12 noon.     . digoxin (LANOXIN) 0.125 MG tablet Take 0.125 mg by mouth daily.    Marland Kitchen diltiazem (CARDIZEM CD) 240 MG 24 hr capsule Take 240 mg by mouth daily at 12 noon.    . docusate sodium (COLACE) 100 MG capsule Take 1 capsule (100 mg total) by mouth 2 (two) times daily. 10 capsule 0  . finasteride (PROSCAR) 5 MG tablet Take 5 mg by mouth at bedtime.     . hydrochlorothiazide (HYDRODIURIL) 25 MG tablet Take 25 mg by mouth daily.    Marland Kitchen HYDROcodone-acetaminophen (NORCO/VICODIN) 5-325 MG tablet Take 1 tablet by mouth every 6 (six) hours as needed for moderate pain. 15 tablet 0  . lactose free nutrition (BOOST PLUS) LIQD Take 237 mLs by mouth 3 (three) times daily between meals. 90 Can 0  . latanoprost (XALATAN) 0.005 % ophthalmic solution Place 1 drop into both eyes at bedtime.     Marland Kitchen lisinopril (PRINIVIL,ZESTRIL) 10 MG tablet Take 1 tablet (10 mg total) by mouth daily. 90 tablet 2  . magnesium oxide (MAGNESIUM-OXIDE) 400 (241.3 Mg) MG tablet Take 400 mg by mouth daily at 12 noon.     . Multiple Vitamin (MULTIVITAMIN WITH MINERALS) TABS tablet Take 1 tablet by mouth daily at 12 noon.    Marland Kitchen omega-3 acid ethyl esters (LOVAZA) 1 g capsule Take 1 g by mouth daily at 12 noon.    . potassium chloride SA (K-DUR,KLOR-CON) 20 MEQ tablet Take 20 mEq by mouth 2 (two) times daily.    . [EXPIRED] predniSONE (DELTASONE) 20 MG tablet Take 2 tablets (40 mg total) by mouth daily. 8 tablet 0  . tamsulosin (FLOMAX) 0.4 MG CAPS capsule Take 0.4 mg by mouth daily.    Marland Kitchen warfarin (COUMADIN) 4 MG tablet Take 4 mg by mouth daily.  4    Home: Home Living Family/patient expects to be discharged to:: Inpatient rehab Living Arrangements: Spouse/significant other Available Help at Discharge: Family, Available 24 hours/day Type of Home: House Home Access: Stairs to enter [employer] of Steps:  (thats the front door;) Entrance Stairs-Rails: Right, Left, Can reach both Home Layout: Two level, Able to live on main level with bedroom/bathroom, Full bath on main level (his office is upstairs that  he uses daily) Alternate Level Stairs-Number of Steps: a flight upstairs then a landing and then 4 steps Alternate Level Stairs-Rails: Left Bathroom Shower/Tub: Chiropodist: Standard Bathroom Accessibility: Yes Home Equipment: Walker - 2 wheels, Other (comment) Additional Comments: Pt has very supportive family   Lives With: Spouse   Functional History: Prior Function Level of Independence: Independent Comments: prior to recent falls, pt was very independent.  He was active in the community, rides his elliptical daily, enjoys watching and tracking the stock market and is an avid reader   Functional Status:  Mobility: Bed Mobility Overal bed mobility: Needs Assistance Bed Mobility: Supine to Sit Sidelying to sit: Mod assist, +2 for safety/equipment Supine to sit: Min assist, HOB elevated Sit to supine: Min assist, +2 for safety/equipment General bed mobility comments:  Pt requires assist to lift trunk from bed and mod cues for technique and hand placement.  HOB elevated and pt relying heavily on railings for leverage at trunk.  Transfers Overall transfer level: Needs assistance Equipment used: Rolling walker (2 wheeled) Transfers: Sit to/from Stand Sit to Stand: +2 safety/equipment, Min guard, From elevated surface Stand pivot transfers: Min assist General transfer comment: Min guard assist for balance and stability of RW during transitions from elevated bed. Second person for safety, but ultimately not needed.   Ambulation/Gait Ambulation/Gait assistance: +2 safety/equipment, Min assist Ambulation Distance (Feet): 65 Feet Assistive device: Rolling walker (2 wheeled) Gait Pattern/deviations: Step-to pattern, Antalgic, Trunk flexed General Gait Details: Pt needed min assist for balance (less posterior preference today), and assist to steer RW.  Second person follwed with chair to encourage increased gait distance and safety.  Verbal cues for upright posture.  Gait velocity: decreased Gait velocity interpretation: Below normal speed for age/gender    ADL: ADL Overall ADL's : Needs assistance/impaired Eating/Feeding: Set up, Sitting Eating/Feeding Details (indicate cue type and reason): Per family, pt with poor appetite  Grooming: Wash/dry hands, Wash/dry face, Oral care, Brushing hair, Set up, Sitting Upper Body Bathing: Minimal assistance, Sitting Lower Body Bathing: Moderate assistance, Sit to/from stand Lower Body Bathing Details (indicate cue type and reason): mod A  Upper Body Dressing : Sitting, Minimal assistance Lower Body Dressing: Moderate assistance, Sit to/from stand Lower Body Dressing Details (indicate cue type and reason): Pt able to doff socks.  he requires max A to don them, and wife reports she does this for him at home.  Anticipate he will be able to don pants with mod A  Toilet Transfer: Minimal assistance, +2 for safety/equipment,  Ambulation, Regular Toilet, BSC, Grab bars, RW Toileting- Clothing Manipulation and Hygiene: Maximal assistance, Sit to/from stand Toileting - Clothing Manipulation Details (indicate cue type and reason): Pt had incontintent episode of diarrhea and still with some residual stool on peri area.  Assisted him with peri care in standing  Functional mobility during ADLs: Minimal assistance, +2 for safety/equipment, Rolling walker General ADL Comments: pt more appropriate and engaged this date.  He continues to complain of dizziness, but reports it is improved.   Cognition: Cognition Overall Cognitive Status: Impaired/Different from baseline Orientation Level: Oriented to person, Oriented to time, Disoriented to place, Disoriented to situation Cognition Arousal/Alertness: Awake/alert Behavior During Therapy: WFL for tasks assessed/performed Overall Cognitive Status: Impaired/Different from baseline Area of Impairment: Attention, Memory, Following commands, Safety/judgement, Problem solving Current Attention Level: Selective Memory: Decreased short-term memory Following Commands: Follows multi-step commands inconsistently Safety/Judgement: Decreased awareness of safety Problem Solving: Difficulty sequencing, Requires verbal cues, Requires tactile cues General  Comments: Pt more appropriate today.  Still with some confusion   Physical Exam: Blood pressure 130/66, pulse 73, temperature 98.6 F (37 C), temperature source Oral, resp. rate 16, SpO2 97 %. Physical Exam  Vitals reviewed. Constitutional: He appears well-developed. No distress.  HENT:  Head: Normocephalic and atraumatic.  Eyes: EOM are normal. Left eye exhibits no discharge.  Neck: Normal range of motion. Neck supple. No JVD present. No tracheal deviation present. No thyromegaly present.  Cardiovascular: Normal rate and regular rhythm.  Exam reveals no gallop and no friction rub.   No murmur heard. Cardiac rate controlled    Respiratory: Effort normal and breath sounds normal. No respiratory distress. He has no wheezes. He has no rales. He exhibits no tenderness.  GI: Soft. Bowel sounds are normal. He exhibits no distension. There is no tenderness. There is no rebound and no guarding.  Musculoskeletal: Edema: edema left thigh,lower extremity.  Neurological: He is alert. No cranial nerve deficit. Coordination normal.  Oriented to month, place and person  Follows simple commands. Motor 4/5 to 5/5 bilateral upper exts. LLE  HF limited by pain--able to rotate the limb in bed, bends knee at least 3- to 3/5. ADF/PF 4/5. RLE 3/5 HF (with some pain inhibition), 4/5 KE and 5/5 ADF/PF. No sensory deficits Skin: bruising on both thighs. Left incision dry/post-op dressing in place Psych: pleasant and cooperative.     Results for orders placed or performed during the hospital encounter of 08/02/16 (from the past 48 hour(s))  CBC     Status: Abnormal   Collection Time: 08/07/16  4:15 AM  Result Value Ref Range   WBC 14.1 (H) 4.0 - 10.5 K/uL   RBC 3.02 (L) 4.22 - 5.81 MIL/uL   Hemoglobin 9.0 (L) 13.0 - 17.0 g/dL   HCT 28.2 (L) 39.0 - 52.0 %   MCV 93.4 78.0 - 100.0 fL   MCH 29.8 26.0 - 34.0 pg   MCHC 31.9 30.0 - 36.0 g/dL   RDW 16.9 (H) 11.5 - 15.5 %   Platelets 201 150 - 400 K/uL  Basic metabolic panel     Status: Abnormal   Collection Time: 08/07/16  4:15 AM  Result Value Ref Range   Sodium 140 135 - 145 mmol/L   Potassium 3.8 3.5 - 5.1 mmol/L   Chloride 112 (H) 101 - 111 mmol/L   CO2 25 22 - 32 mmol/L   Glucose, Bld 100 (H) 65 - 99 mg/dL   BUN 31 (H) 6 - 20 mg/dL   Creatinine, Ser 0.96 0.61 - 1.24 mg/dL   Calcium 7.8 (L) 8.9 - 10.3 mg/dL   GFR calc non Af Amer >60 >60 mL/min   GFR calc Af Amer >60 >60 mL/min    Comment: (NOTE) The eGFR has been calculated using the CKD EPI equation. This calculation has not been validated in all clinical situations. eGFR's persistently <60 mL/min signify possible Chronic  Kidney Disease.    Anion gap 3 (L) 5 - 15  Protime-INR     Status: Abnormal   Collection Time: 08/07/16  4:15 AM  Result Value Ref Range   Prothrombin Time 15.4 (H) 11.4 - 15.2 seconds   INR 1.21   Protime-INR     Status: Abnormal   Collection Time: 08/08/16  6:54 AM  Result Value Ref Range   Prothrombin Time 15.7 (H) 11.4 - 15.2 seconds   INR 1.25   CBC with Differential/Platelet     Status: Abnormal   Collection Time: 08/08/16  6:54 AM  Result Value Ref Range   WBC 8.9 4.0 - 10.5 K/uL   RBC 2.80 (L) 4.22 - 5.81 MIL/uL   Hemoglobin 8.2 (L) 13.0 - 17.0 g/dL   HCT 26.1 (L) 39.0 - 52.0 %   MCV 93.2 78.0 - 100.0 fL   MCH 29.3 26.0 - 34.0 pg   MCHC 31.4 30.0 - 36.0 g/dL   RDW 16.9 (H) 11.5 - 15.5 %   Platelets 169 150 - 400 K/uL   Neutrophils Relative % 83 %   Neutro Abs 7.4 1.7 - 7.7 K/uL   Lymphocytes Relative 6 %   Lymphs Abs 0.5 (L) 0.7 - 4.0 K/uL   Monocytes Relative 9 %   Monocytes Absolute 0.8 0.1 - 1.0 K/uL   Eosinophils Relative 2 %   Eosinophils Absolute 0.2 0.0 - 0.7 K/uL   Basophils Relative 0 %   Basophils Absolute 0.0 0.0 - 0.1 K/uL  Comprehensive metabolic panel     Status: Abnormal   Collection Time: 08/08/16  6:54 AM  Result Value Ref Range   Sodium 142 135 - 145 mmol/L   Potassium 3.6 3.5 - 5.1 mmol/L   Chloride 109 101 - 111 mmol/L   CO2 22 22 - 32 mmol/L   Glucose, Bld 92 65 - 99 mg/dL   BUN 23 (H) 6 - 20 mg/dL   Creatinine, Ser 0.87 0.61 - 1.24 mg/dL   Calcium 7.9 (L) 8.9 - 10.3 mg/dL   Total Protein 4.4 (L) 6.5 - 8.1 g/dL   Albumin 2.1 (L) 3.5 - 5.0 g/dL   AST 20 15 - 41 U/L   ALT 12 (L) 17 - 63 U/L   Alkaline Phosphatase 58 38 - 126 U/L   Total Bilirubin 1.7 (H) 0.3 - 1.2 mg/dL   GFR calc non Af Amer >60 >60 mL/min   GFR calc Af Amer >60 >60 mL/min    Comment: (NOTE) The eGFR has been calculated using the CKD EPI equation. This calculation has not been validated in all clinical situations. eGFR's persistently <60 mL/min signify possible  Chronic Kidney Disease.    Anion gap 11 5 - 15  Magnesium     Status: None   Collection Time: 08/08/16  6:54 AM  Result Value Ref Range   Magnesium 1.9 1.7 - 2.4 mg/dL  Phosphorus     Status: Abnormal   Collection Time: 08/08/16  6:54 AM  Result Value Ref Range   Phosphorus 2.2 (L) 2.5 - 4.6 mg/dL   Mr Brain Wo Contrast  Result Date: 08/06/2016 CLINICAL DATA:  Confusion since hip surgery February 3rd. EXAM: MRI HEAD WITHOUT CONTRAST TECHNIQUE: Multiplanar, multiecho pulse sequences of the brain and surrounding structures were obtained without intravenous contrast. COMPARISON:  08/02/2016 head CT FINDINGS: Brain: Interval development of subdural collection around the left cerebral hemisphere which has MR features most compatible with hygroma. The collection measures up to 10 mm in thickness in the frontal region, mass effect on the left cerebral hemisphere is mild in the setting of atrophy. At baseline the septum pellucidum is right of midline, and this appearance is mildly accentuated with the mass-effect. No brain edema. Incidental fatty mass along the right tectum measuring 7 mm. Mild generalized volume loss. Mild periventricular chronic microvascular ischemia. No acute infarct or hydrocephalus Vascular: Preserved flow voids Skull and upper cervical spine: Negative Sinuses/Orbits: Bilateral cataract resection. No posttraumatic finding Other: These results will be called to the ordering clinician or representative by the Radiologist Assistant, and communication  documented in the PACS or zVision Dashboard. IMPRESSION: 1. New subdural collection around the left cerebral convexity measuring up to 10 mm in thickness, signal most consistent with hygroma. Left cerebral mass effect is mild. CT follow-up is recommended. 2. No postoperative infarct. 3. Incidental tectal lipoma. Electronically Signed   By: Monte Fantasia M.D.   On: 08/06/2016 16:59       Medical Problem List and Plan: 1.  Decreased  functional mobility secondary to left subdural hygroma and left femoral neck fracture status post left hip replacement anterior approach 76/22/6333 complicated by findings of left frontal subdural hygroma. Weightbearing as tolerated  -admit to inpatient rehab 2.  DVT Prophylaxis/Anticoagulation: SCDs. Check vascular study 3. Pain Management: Patient currently using only Tylenol for pain due to postoperative delirium related to narcotics 4. Mood/delirium: Provide emotional support as appropriate 5. Neuropsych: This patient is capable of making decisions on his own behalf. 6. Skin/Wound Care: Routine skin checks 7. Fluids/Electrolytes/Nutrition: Routine I&O with follow-up chemistries upon admit  -encourage PO. Appetite beginning to pick up 8. Right thigh hematoma secondary to fall. Conservative care 9. Atrial fibrillation. Cardiac rate control. Follow-up per cardiology services. No current plan to resume Coumadin therapy. Lanoxin 0.125 mg daily, Cardizem CD 240 mg daily 10. Hypertension. HCTZ 25 mg daily, lisinopril 10 mg daily. Monitor with increased mobility 11. Acute on chronic anemia. Follow-up CBC. Continue iron supplement 12. History of gout. Zyloprim 300 mg daily. Monitor for any signs of flareup 13. BPH. Proscar 5 mg daily, Flomax 0.4 mg daily. Check PVR 3 14. Hyperlipidemia.Lovaza 1 mg daily 15. Constipation. Laxative assistance      Post Admission Physician Evaluation: 1. Functional deficits secondary  to left subdural hygroma and left FNF s/p THA. 2. Patient is admitted to receive collaborative, interdisciplinary care between the physiatrist, rehab nursing staff, and therapy team. 3. Patient's level of medical complexity and substantial therapy needs in context of that medical necessity cannot be provided at a lesser intensity of care such as a SNF. 4. Patient has experienced substantial functional loss from his/her baseline which was documented above under the "Functional  History" and "Functional Status" headings.  Judging by the patient's diagnosis, physical exam, and functional history, the patient has potential for functional progress which will result in measurable gains while on inpatient rehab.  These gains will be of substantial and practical use upon discharge  in facilitating mobility and self-care at the household level. 5. Physiatrist will provide 24 hour management of medical needs as well as oversight of the therapy plan/treatment and provide guidance as appropriate regarding the interaction of the two. 6. The Preadmission Screening has been reviewed and patient status is unchanged unless otherwise stated above. 7. 24 hour rehab nursing will assist with bladder management, bowel management, safety, skin/wound care, disease management, medication administration, pain management and patient education  and help integrate therapy concepts, techniques,education, etc. 8. PT will assess and treat for/with: Lower extremity strength, range of motion, stamina, balance, functional mobility, safety, adaptive techniques and equipment, ortho precautions, pain control, family education.   Goals are: mod I. 9. OT will assess and treat for/with: ADL's, functional mobility, safety, upper extremity strength, adaptive techniques and equipment, pain mgt, ortho precautions, community reintegration, family ed.   Goals are: mod I to min assist. Therapy may proceed with showering this patient. 10. SLP will assess and treat for/with: cognition, communication, education.  Goals are: mod I. 11. Case Management and Social Worker will assess and treat for psychological issues and  discharge planning. 12. Team conference will be held weekly to assess progress toward goals and to determine barriers to discharge. 13. Patient will receive at least 3 hours of therapy per day at least 5 days per week. 14. ELOS: 10-13 days       15. Prognosis:  excellent     Meredith Staggers, MD,  [facility] 08/08/2016  Cathlyn Parsons., PA-C 08/08/2016

## 2016-08-09 ENCOUNTER — Encounter (HOSPITAL_COMMUNITY): Payer: Self-pay | Admitting: Orthopedic Surgery

## 2016-08-09 ENCOUNTER — Inpatient Hospital Stay (HOSPITAL_COMMUNITY): Payer: Medicare Other | Admitting: Occupational Therapy

## 2016-08-09 ENCOUNTER — Encounter (HOSPITAL_COMMUNITY): Payer: Medicare Other

## 2016-08-09 ENCOUNTER — Inpatient Hospital Stay (HOSPITAL_COMMUNITY): Payer: Medicare Other | Admitting: Speech Pathology

## 2016-08-09 ENCOUNTER — Inpatient Hospital Stay (HOSPITAL_COMMUNITY): Payer: Medicare Other | Admitting: Physical Therapy

## 2016-08-09 LAB — COMPREHENSIVE METABOLIC PANEL
ALK PHOS: 71 U/L (ref 38–126)
ALT: 14 U/L — AB (ref 17–63)
ANION GAP: 12 (ref 5–15)
AST: 28 U/L (ref 15–41)
Albumin: 2.6 g/dL — ABNORMAL LOW (ref 3.5–5.0)
BUN: 21 mg/dL — ABNORMAL HIGH (ref 6–20)
CO2: 22 mmol/L (ref 22–32)
CREATININE: 1 mg/dL (ref 0.61–1.24)
Calcium: 8.4 mg/dL — ABNORMAL LOW (ref 8.9–10.3)
Chloride: 107 mmol/L (ref 101–111)
Glucose, Bld: 124 mg/dL — ABNORMAL HIGH (ref 65–99)
Potassium: 3.6 mmol/L (ref 3.5–5.1)
SODIUM: 141 mmol/L (ref 135–145)
Total Bilirubin: 2.6 mg/dL — ABNORMAL HIGH (ref 0.3–1.2)
Total Protein: 5.4 g/dL — ABNORMAL LOW (ref 6.5–8.1)

## 2016-08-09 LAB — CBC WITH DIFFERENTIAL/PLATELET
Basophils Absolute: 0 10*3/uL (ref 0.0–0.1)
Basophils Relative: 0 %
EOS ABS: 0.2 10*3/uL (ref 0.0–0.7)
EOS PCT: 1 %
HCT: 34 % — ABNORMAL LOW (ref 39.0–52.0)
HEMOGLOBIN: 10.7 g/dL — AB (ref 13.0–17.0)
LYMPHS ABS: 1.1 10*3/uL (ref 0.7–4.0)
LYMPHS PCT: 7 %
MCH: 29.5 pg (ref 26.0–34.0)
MCHC: 31.5 g/dL (ref 30.0–36.0)
MCV: 93.7 fL (ref 78.0–100.0)
MONOS PCT: 8 %
Monocytes Absolute: 1.1 10*3/uL — ABNORMAL HIGH (ref 0.1–1.0)
Neutro Abs: 12.5 10*3/uL — ABNORMAL HIGH (ref 1.7–7.7)
Neutrophils Relative %: 84 %
PLATELETS: 278 10*3/uL (ref 150–400)
RBC: 3.63 MIL/uL — AB (ref 4.22–5.81)
RDW: 16.8 % — ABNORMAL HIGH (ref 11.5–15.5)
WBC: 14.9 10*3/uL — AB (ref 4.0–10.5)

## 2016-08-09 MED ORDER — PRO-STAT SUGAR FREE PO LIQD
30.0000 mL | Freq: Three times a day (TID) | ORAL | Status: DC
  Administered 2016-08-12 – 2016-08-14 (×3): 30 mL via ORAL
  Filled 2016-08-09 (×15): qty 30

## 2016-08-09 NOTE — Progress Notes (Signed)
[facility]IC[facility]ILITATION     PROGRESS NOTE    Subjective/Complaints: Had a reasonable night.   ROS: pt denies nausea, vomiting, diarrhea, cough, shortness of breath or chest pain   Objective: Vital Signs: Blood pressure (!) 123/57, pulse 69, temperature 98.2 F (36.8 C), temperature source Oral, resp. rate 18, height 6\' 2"  (1.88 m), weight 78.9 kg (174 lb), SpO2 95 %. No results found.  Recent Labs  08/07/16 0415 08/08/16 0654  WBC 14.1* 8.9  HGB 9.0* 8.2*  HCT 28.2* 26.1*  PLT 201 169    Recent Labs  08/07/16 0415 08/08/16 0654  NA 140 142  K 3.8 3.6  CL 112* 109  GLUCOSE 100* 92  BUN 31* 23*  CREATININE 0.96 0.87  CALCIUM 7.8* 7.9*   CBG (last 3)  No results for input(s): GLUCAP in the last 72 hours.  Wt Readings from Last 3 Encounters:  08/08/16 78.9 kg (174 lb)  07/27/16 78 kg (172 lb)  06/14/16 77.1 kg (170 lb)    Physical Exam:  Head: Normocephalicand atraumatic.  Eyes: EOMI  Neck: Normal range of motion. Neck supple. No JVDpresent. No tracheal deviationpresent. No thyromegalypresent.  Cardiovascular: RRR Respiratory: CTA B  GI: Soft. Bowel sounds are normal. He exhibits no distension. There is no tenderness. There is no reboundand no guarding.  Musculoskeletal: Edema: 1+ edema left thigh,lower extremity.  Neurological: He is alert. No cranial nerve deficit. Coordinationnormal.  Oriented to person, month, place, reason Follows simple commands. Motor 4/5 to 5/5 bilateral upper exts. LLE HF limited by pain--able to rotate the limb in bed, bends knee at least 3- to 3/5. ADF/PF 4/5. RLE 3/5 HF (with some pain inhibition), 4/5 KE and 5/5 ADF/PF--motor exam unchanged. No sensory deficits Skin: bruising on both thighs. Left incision dry/post-op dressing in place--no change Psych: pleasant and cooperative.   Assessment/Plan: 1. FUnctional deficits secondary to left femoral neck fracture, let subdural hygroma which require 3+  hours per day of interdisciplinary therapy in a comprehensive inpatient rehab setting. Physiatrist is providing close team supervision and 24 hour management of active medical problems listed below. Physiatrist and rehab team continue to assess barriers to discharge/monitor patient progress toward functional and medical goals.  Function:  Bathing Bathing position      Bathing parts      Bathing assist        Upper Body Dressing/Undressing Upper body dressing                    Upper body assist        Lower Body Dressing/Undressing Lower body dressing                                  Lower body assist        Toileting Toileting          Toileting assist     Transfers Chair/bed transfer   Chair/bed transfer method: Stand pivot Chair/bed transfer assist level: Touching or steadying assistance (Pt > 75%) Chair/bed transfer assistive device: Armrests, Medical sales representative     Max distance: 37' Assist level: Touching or steadying assistance (Pt > 75%)   Wheelchair   Type: Manual Max wheelchair distance: 150 Assist Level: Supervision or verbal cues  Cognition Comprehension Comprehension assist level: Follows basic conversation/direction with extra time/assistive device  Expression Expression assist level: Expresses complex 90% of the time/cues <  10% of the time  Social Interaction Social Interaction assist level: Interacts appropriately 90% of the time - Needs monitoring or encouragement for participation or interaction.  Problem Solving Problem solving assist level: Solves complex 90% of the time/cues < 10% of the time  Memory Memory assist level: Recognizes or recalls 75 - 89% of the time/requires cueing 10 - 24% of the time   Medical Problem List and Plan: 1. Decreased functional mobilitysecondary to left subdural hygroma andleft femoral neck fracture status post left hip replacement anterior approach [id number] complicated by  findings of left frontal subdural hygroma. Weightbearing as tolerated -begin CIR therapies today 2. DVT Prophylaxis/Anticoagulation: SCDs. Have requested a vascular study 3. Pain Management: Patient currently using only Tylenol for pain due to postoperative deliriumrelated to narcotics 4. Mood/delirium: Provide emotional support as appropriate 5. Neuropsych: This patient iscapable of making decisions on hisown behalf. 6. Skin/Wound Care: Routine skin checks 7. Fluids/Electrolytes/Nutrition: folow up labs this week. -encourage PO. Appetite has been improving 8.Right thigh hematoma secondary to fall. Conservative care 9.Atrial fibrillation. Cardiac rate control. Follow-up per cardiology services. No current plan to resume Coumadin therapy. Lanoxin 0.125 mg daily, Cardizem CD 240 mg daily 10.Hypertension. HCTZ 25 mg daily, lisinopril 10 mg daily.    Good control at present 11.Acute on chronic anemia. Follow-up CBC. Continue iron supplement 12.History of gout. Zyloprim 300 mg daily. Monitor for any signs of flareup 13.BPH. Proscar5 mg daily, Flomax 0.4 mg daily. Check PVR's 3 14.Hyperlipidemia.Lovaza1 mg daily 15.Constipation. Laxative assistance   LOS (Days) 1 A FACE TO FACE EVALUATION WAS PERFORMED  Meredith Staggers, MD 08/09/2016 9:36 AM

## 2016-08-09 NOTE — Progress Notes (Signed)
Initial Nutrition Assessment  DOCUMENTATION CODES:   Severe malnutrition in context of chronic illness  INTERVENTION:  Continue Boost Plus po TID, each supplement provides 360 kcal and 14 grams of protein.   Provide 30 ml Prostat po TID, each supplement provides 100 kcal and 15 grams of protein.   Provide nourishment snacks (ordered).  Encourage adequate PO intake.  NUTRITION DIAGNOSIS:   Malnutrition related to chronic illness as evidenced by severe depletion of body fat, severe depletion of muscle mass.  GOAL:   Patient will meet greater than or equal to 90% of their needs  MONITOR:   PO intake, Supplement acceptance, Labs, Weight trends, Skin, I & O's  REASON FOR ASSESSMENT:   Malnutrition Screening Tool    ASSESSMENT:   83 year old right-handed male with past medical history of left TKA 10 years ago, gouty arthritis, chronic atrial fibrillation maintained on Coumadin, hypertension, melanoma. Presented 08/02/2016 after a fall while ambulating to the bathroom.  X-rays and imaging revealed left hip femoral neck fracture. Noted hemoglobin findings of 7.7 he was transfused preoperatively. Patient received cardiac clearance with echocardiogram showing ejection fraction of 55% no wall motion abnormalities and underwent anterior approach left total hip arthroplasty 08/05/2016. Physical and occupational therapy evaluations completed 08/06/2016 with recommendations of physical medicine rehabilitation consult.   Pt reports having a lack of appetite which has been ongoing since admission 1/31. Meal completion has been 5-25%. Wife at bedside has been encouraging pt to eat more at meals. Noted multiple snacks in room brought in by wife. Pt was eating well PTA with usual consumption of at least 3 meals a day with a Boost shake once daily. Weight has been stable. Pt currently has Boost Plus ordered. RD to additionally order Prostat to aid in adequate nutrition. Wife additionally requested  nourishment snacks to be ordered (yogurt). RD to order.   Nutrition-Focused physical exam completed. Findings are severe fat depletion, severe muscle depletion, and mild edema.   Labs and medications reviewed.   Diet Order:  Diet regular Room service appropriate? Yes; Fluid consistency: Thin  Skin:   (Incision on L hip)  Last BM:  2/5  Height:   Ht Readings from Last 1 Encounters:  08/08/16 6\' 2"  (1.88 m)    Weight:   Wt Readings from Last 1 Encounters:  08/08/16 174 lb (78.9 kg)    Ideal Body Weight:  86.36 kg  BMI:  Body mass index is 22.34 kg/m.  Estimated Nutritional Needs:   Kcal:  1750-1900  Protein:  80-90 grams  Fluid:  1.7 - 1.9 L/day  EDUCATION NEEDS:   No education needs identified at this time  Corrin Parker, MS, RD, LDN Pager # [phone] After hours/ weekend pager # [phone]

## 2016-08-09 NOTE — Evaluation (Signed)
Speech Language Pathology Assessment and Plan  Patient Details  Name: Isaac Villa MRN: [id number] Date of Birth: [birth year]  SLP Diagnosis: Cognitive Impairments  Rehab Potential: Good ELOS: ~7 days     Today's Date: 08/09/2016 SLP Individual Time: 7253-6644 SLP Individual Time Calculation (min): 72 min   Problem List:  Patient Active Problem List   Diagnosis Date Noted  . Left displaced femoral neck fracture ([place]) 08/08/2016  . Protein-calorie malnutrition, severe 08/03/2016  . Hip fracture ([place]) 08/02/2016  . BPH (benign prostatic hyperplasia) 08/02/2016  . Protein calorie malnutrition ([place]) 08/02/2016  . Closed hip fracture, left, initial encounter ([place])   . Physical deconditioning   . Traumatic hematoma of buttock 08/01/2016  . Rhabdomyolysis 08/01/2016  . Symptomatic anemia 07/27/2016  . Generalized weakness 07/27/2016  . Hyperkalemia 07/27/2016  . Acute kidney injury ([place]) 07/27/2016  . Elevated INR 07/27/2016  . Hematoma 07/27/2016  . Atrial fibrillation ([place]) [I48.91] 02/08/2016  . Long term (current) use of anticoagulants [Z79.01] 02/08/2016  . Edema extremities 06/04/2014  . Chronic anticoagulation 06/04/2013  . Thrombocytopenia ([place])   . Chronic atrial fibrillation ([place]) 05/30/2013  . Essential hypertension, benign 05/30/2013   Past Medical History:  Past Medical History:  Diagnosis Date  . Atrial fibrillation ([facility])    Chronic  . BPH (benign prostatic hypertrophy)   . Edema extremities 06/04/2014  . Elevated cholesterol   . Fractured femoral neck ([place]) 08/02/2016   CLOSED FRACTURE  . Glaucoma   . Gout   . Hernia   . Herniated disc   . Hypertension   . Melanoma ([place])   . Melanoma ([place])   . Thrombocytopenia ([place])    Past Surgical History:  Past Surgical History:  Procedure Laterality Date  . ANTERIOR APPROACH HEMI HIP ARTHROPLASTY Left 08/05/2016   Procedure: HEMI vs TOTAL HIP REPLACEMENT ANTERIOR APPROACH;  Surgeon: Paralee Cancel, MD;  Location:  [facility];  Service: Orthopedics;  Laterality: Left;  . APPENDECTOMY    . CATARACT EXTRACTION Bilateral 04/2008  . EYE SURGERY     lens replacements  . HERNIA REPAIR    . JOINT REPLACEMENT     left knee  . [place] SURGERY  07/2004  . REPLACEMENT TOTAL KNEE Left 11/2006  . TONSILLECTOMY      Assessment / Plan / Recommendation Clinical Impression 83 year old right-handed male with past medical history of left TKA10 years ago, gouty arthritis, chronic atrial fibrillation maintained on Coumadin, hypertension, melanoma. Patient recently admitted 07/27/2016-08/01/2016 due to symptomatic anemia secondary to right thigh hematoma which was sustained after a mechanical fall on anticoagulation with supra therapeutic INR. He was transfused and stabilized. Plan was to hold Coumadin for approximately 10 days. He was discharged to [facility] skilled nursing facility.Prior to late January admission patient lived with wife very independent and active prior to admission. Wife is of good health and can also assist as well as multiple family members.Presented 08/02/2016 after a fall while ambulating to the bathroom at the SNF rehab.He denied any chest pain dizziness or loss of consciousness. Complains of left hip pain. Cranial CT scan showed mild diffuse cortical atrophy. No acute intracranial abnormality. X-rays and imaging revealed left hip femoral neck fracture. Noted hemoglobin findings of 7.7 he was transfused preoperatively. Patient received cardiac clearance with echocardiogram showing ejection fraction of 55% no wall motion abnormalities and underwent anterior approach left total hip arthroplasty 08/05/2016 per Dr. Alvan Dame. Weightbearing as tolerated left lower extremity. Initially his chronic Coumadin for atrial fibrillation was resumed. Patient  with acute delirium postoperatively. MRI of the brain 08/06/2016 showed new subdural collection around the left cerebral convexity measuring up to 10 mm in thickness  signal consistent with hygroma. Left cerebral mass effect was mild. No postoperative infarct. Neurosurgery Dr. Ellene Route consulted and advised conservative care. Patient was cleared to begin low dose aspirin 81 mg daily but no Coumadin therapy. Close monitoring of hemoglobin latest of 9.0.   Patient was admitted to [facility] 08/08/16 and demonstrates mild higher level cognitive impairments characterized by deficits in the area of storage and retrieval of new information, self-monitoring and correcting during math calculations, and delayed processing which impact the patient's overall safety with functional self-care and home management tasks. Patient would benefit from skilled SLP intervention in order to maximize his functional independence prior to discharge. Anticipate patient will require 24 hour supervision at home and likely no follow up SLP services.    Skilled Therapeutic Interventions          Montreal Cognitive Assessment 8.1 administered, patient demonstrated skills consistent with a score of 20/30 with 26 or greater being considered to be [facility].  Results and recommendations reviewed with patient and family.  Skilled treatment initiated with focused on addressing cognition goals. Patient demonstrated method from PTA for note taking and recall of daily events, with Min verbal cues to recognize and correct errors.  SLP then facilitated session by providing verbal education regarding recommended recall compensatory strategies.  Continue with current plan of care.    SLP Assessment  Patient will need skilled [facility] during CIR admission    Recommendations  Oral Care Recommendations: Oral care BID Patient destination: Home Follow up Recommendations: None Equipment Recommended: None recommended by SLP    SLP Frequency 3 to 5 out of 7 days   SLP Duration  SLP Intensity  SLP Treatment/Interventions ~7 days   Minumum of 1-2 x/day, 30 to 90  minutes  Cognitive remediation/compensation;Cueing hierarchy;Functional tasks;Medication managment;Internal/external aids;Patient/family education    Pain Pain Assessment Pain Assessment: No/denies pain  Prior Functioning Cognitive/Linguistic Baseline: Baseline deficits Baseline deficit details: recall, but was Mod I with use of compensatory strategies  Type of Home: House  Lives With: Spouse Available Help at Discharge: Family;Available 24 hours/day Vocation: Retired  Function:  Cognition Comprehension Comprehension assist level: Understands complex 90% of the time/cues 10% of the time  Expression   Expression assist level: Expresses complex ideas: With extra time/assistive device  Social Interaction Social Interaction assist level: Interacts appropriately 90% of the time - Needs monitoring or encouragement for participation or interaction.  Problem Solving Problem solving assist level: Solves complex 90% of the time/cues < 10% of the time  Memory Memory assist level: Recognizes or recalls 75 - 89% of the time/requires cueing 10 - 24% of the time   Short Term Goals: Week 1: SLP Short Term Goal 1 (Week 1): short term goals = long term goals due to length of stay   Refer to Care Plan for Long Term Goals  Recommendations for other services: None   Discharge Criteria: Patient will be discharged from SLP if patient refuses treatment 3 consecutive times without medical reason, if treatment goals not met, if there is a change in medical status, if patient makes no progress towards goals or if patient is discharged from hospital.  The above assessment, treatment plan, treatment alternatives and goals were discussed and mutually agreed upon: by patient and by family  Carmelia Roller., CCC-SLP [phone]  [place] 08/09/2016, 4:48 PM

## 2016-08-09 NOTE — Evaluation (Signed)
Occupational Therapy Assessment and Plan  Patient Details  Name: Isaac Villa MRN: [id number] Date of Birth: [birth year]  OT Diagnosis: acute pain, cognitive deficits and muscle weakness (generalized) Rehab Potential: Rehab Potential (ACUTE ONLY): Good ELOS: 7-8 days   Today's Date: 08/09/2016 OT Individual Time: 0945-1100 OT Individual Time Calculation (min): 75 min     Problem List:  Patient Active Problem List   Diagnosis Date Noted  . Left displaced femoral neck fracture ([place]) 08/08/2016  . Protein-calorie malnutrition, severe 08/03/2016  . Hip fracture ([place]) 08/02/2016  . BPH (benign prostatic hyperplasia) 08/02/2016  . Protein calorie malnutrition ([place]) 08/02/2016  . Closed hip fracture, left, initial encounter ([place])   . Physical deconditioning   . Traumatic hematoma of buttock 08/01/2016  . Rhabdomyolysis 08/01/2016  . Symptomatic anemia 07/27/2016  . Generalized weakness 07/27/2016  . Hyperkalemia 07/27/2016  . Acute kidney injury ([place]) 07/27/2016  . Elevated INR 07/27/2016  . Hematoma 07/27/2016  . Atrial fibrillation ([place]) [I48.91] 02/08/2016  . Long term (current) use of anticoagulants [Z79.01] 02/08/2016  . Edema extremities 06/04/2014  . Chronic anticoagulation 06/04/2013  . Thrombocytopenia ([place])   . Chronic atrial fibrillation ([place]) 05/30/2013  . Essential hypertension, benign 05/30/2013    Past Medical History:  Past Medical History:  Diagnosis Date  . Atrial fibrillation ([facility])    Chronic  . BPH (benign prostatic hypertrophy)   . Edema extremities 06/04/2014  . Elevated cholesterol   . Fractured femoral neck ([place]) 08/02/2016   CLOSED FRACTURE  . Glaucoma   . Gout   . Hernia   . Herniated disc   . Hypertension   . Melanoma ([place])   . Melanoma ([place])   . Thrombocytopenia ([place])    Past Surgical History:  Past Surgical History:  Procedure Laterality Date  . ANTERIOR APPROACH HEMI HIP ARTHROPLASTY Left 08/05/2016   Procedure: HEMI vs TOTAL HIP  REPLACEMENT ANTERIOR APPROACH;  Surgeon: Paralee Cancel, MD;  Location: [facility];  Service: Orthopedics;  Laterality: Left;  . APPENDECTOMY    . CATARACT EXTRACTION Bilateral 04/2008  . EYE SURGERY     lens replacements  . HERNIA REPAIR    . JOINT REPLACEMENT     left knee  . [place] SURGERY  07/2004  . REPLACEMENT TOTAL KNEE Left 11/2006  . TONSILLECTOMY      Assessment & Plan Clinical Impression:83 year old right-handed male with past medical history of left TKA 10 years ago, gouty arthritis, chronic atrial fibrillation maintained on Coumadin, hypertension, melanoma. Patient recently admitted 07/27/2016-08/01/2016 due to symptomatic anemia secondary to right thigh hematoma which was sustained after a mechanical fall on anticoagulation with supratherapeutic INR. He was transfused and stabilized. Plan was to hold Coumadin for approximately 10 days. He was discharged to [facility] skilled nursing facility. Prior to late January admission patient lived with wife very independent and active prior to admission. Wife is of good health and can also assist as well as multiple family members. Presented 08/02/2016 after a fall while ambulating to the bathroom. He denied any chest pain dizziness or loss of consciousness. Complains of left hip pain. Cranial CT scan showed mild diffuse cortical atrophy. No acute intracranial abnormality. X-rays and imaging revealed left hip femoral neck fracture. Noted hemoglobin findings of 7.7 he was transfused preoperatively. Patient received cardiac clearance with echocardiogram showing ejection fraction of 55% no wall motion abnormalities and underwent anterior approach left total hip arthroplasty 08/05/2016 per Dr. Alvan Dame. Weightbearing as tolerated left lower extremity. Initially his chronic Coumadin for  atrial fibrillation was resumed. Patient with acute delirium postoperatively. MRI of the brain 08/06/2016 showed new subdural collection around the left cerebral convexity  measuring up to 10 mm in thickness signal consistent with hygroma. Left cerebral mass effect was mild. No postoperative infarct. Neurosurgery Dr. Ellene Route consulted and advised conservative care. Patient was cleared to begin low dose aspirin 81 mg daily but no Coumadin therapy. Close monitoring of hemoglobin latest of 9.0. Physical and occupational therapy evaluations completed 08/06/2016 with recommendations of physical medicine rehabilitation consult. Patient was admitted for a comprehensive rehabilitation program.  Patient transferred to CIR on 08/08/2016 .    Patient currently requires min with basic self-care skills secondary to muscle weakness, decreased cardiorespiratoy endurance, decreased problem solving and delayed processing and decreased standing balance and decreased balance strategies.  Prior to hospitalization, patient was active and independent.  Patient will benefit from skilled intervention to increase independence with basic self-care skills prior to discharge home with care partner.  Anticipate patient will require intermittent supervision and follow up home health.  OT - End of Session Activity Tolerance: Tolerates < 10 min activity with changes in vital signs Endurance Deficit: Yes Endurance Deficit Description: requires seated rest breaks following short duration mobility tasks OT Assessment Rehab Potential (ACUTE ONLY): Good OT Patient demonstrates impairments in the following area(s): Balance;Cognition;Endurance;Motor;Pain OT Basic ADL's Functional Problem(s): Bathing;Dressing;Toileting OT Transfers Functional Problem(s): Toilet;Tub/Shower OT Additional Impairment(s): None OT Plan OT Intensity: Minimum of 1-2 x/day, 45 to 90 minutes OT Frequency: 5 out of 7 days OT Duration/Estimated Length of Stay: 7-8 days OT Treatment/Interventions: Balance/vestibular training;Cognitive remediation/compensation;Discharge planning;DME/adaptive equipment instruction;Functional mobility  training;Pain management;Patient/family education;Self Care/advanced ADL retraining;Psychosocial support;Therapeutic Activities;Therapeutic Exercise OT Self Feeding Anticipated Outcome(s): I OT Basic Self-Care Anticipated Outcome(s): supervision  OT Toileting Anticipated Outcome(s): mod I OT Bathroom Transfers Anticipated Outcome(s): supervision to shower, mod I to toilet OT Recommendation Patient destination: Home Follow Up Recommendations: Home health OT Equipment Recommended: Tub/shower seat   Skilled Therapeutic Intervention Pt seen for initial evaluation and ADL retraining of shower and dressing with a focus on mobility, balance, activity tolerance. Education with pt and spouse on OT purpose, POC, LOS recommendations, and goals. Pt did become lightheaded with an initial drop in blood pressure to 111/48 after the shower. It increased to 110/ 60 with rest. He did need significant cuing for sequencing, problem solving with maneuver the RW into tight spots or figuring out easiest way to don pants. At end of session, pt requesting to lay down. Bed alarm set with rails up.    OT Evaluation Precautions/Restrictions  Precautions Precautions: Fall Precaution Comments: h/o recent falls.  No history of falls prior to 07/24/16 Restrictions LLE Weight Bearing: Weight bearing as tolerated Other Position/Activity Restrictions: has R knee brace if needed    Vital Signs Therapy Vitals BP: 110/60 Patient Position (if appropriate): Sitting Pain Pain Assessment Pain Assessment: 0-10 Pain Score: 5  Faces Pain Scale: Hurts a little bit Pain Type: Surgical pain Pain Location: Hip Pain Orientation: Left Pain Descriptors / Indicators: Aching;Sore Pain Onset: On-going Patients Stated Pain Goal: 0 Pain Intervention(s): Rest Multiple Pain Sites: No Home Living/Prior Functioning Home Living Family/patient expects to be discharged to:: Private residence Living Arrangements: Spouse/significant  other Available Help at Discharge: Family, Available 24 hours/day Type of Home: House Home Access: Stairs to enter [employer] of Steps: 3 STE at front door and in garage; rails both sides Entrance Stairs-Rails: Right, Left, Can reach both Home Layout: Two level, Able to live on main  level with bedroom/bathroom, Full bath on main level Alternate Level Stairs-Number of Steps: a flight upstairs then a landing and then 4 steps Alternate Level Stairs-Rails: Left Bathroom Shower/Tub: Multimedia programmer: Standard Additional Comments: Pt has very supportive family   Lives With: Spouse Prior Function Level of Independence: Independent with gait, Independent with transfers, Independent with basic ADLs  Able to Take Stairs?: Yes Vocation: Retired Leisure: Hobbies-yes (Comment) Comments: prior to recent falls, pt was very independent.  He was active in the community, rides his elliptical daily, enjoys watching and tracking the stock market and is an avid reader  ADL ADL ADL Comments: refer to functional navigator Vision/Perception  Vision- History Baseline Vision/History: Wears glasses;Glaucoma Wears Glasses: Reading only Patient Visual Report: No change from baseline Vision- Assessment Vision Assessment?: No apparent visual deficits  Cognition Overall Cognitive Status: Impaired/Different from baseline Arousal/Alertness: Awake/alert Orientation Level: Person;Place;Situation Person: Oriented Place: Oriented Situation: Oriented Year: 2018 Month: February Day of Week: Correct Immediate Memory Recall: Sock;Blue;Bed Memory Recall: Sock;Blue;Bed Memory Recall Sock: Without Cue Memory Recall Blue: Without Cue Memory Recall Bed: With Cue Problem Solving: Impaired Problem Solving Impairment: Verbal complex;Functional complex Comments: delayed processing Sensation Sensation Light Touch: Appears Intact Stereognosis: Appears Intact Hot/Cold: Appears  Intact Proprioception: Appears Intact Coordination Gross Motor Movements are Fluid and Coordinated: No Fine Motor Movements are Fluid and Coordinated: Yes Heel Shin Test: slow, decreased range Motor  Motor Motor - Skilled Clinical Observations: generalized weakness Mobility  Bed Mobility Bed Mobility: Supine to Sit;Sit to Supine Supine to Sit: 5: Supervision Supine to Sit Details: Verbal cues for precautions/safety;Verbal cues for technique Sit to Supine: 5: Supervision Sit to Supine - Details: Verbal cues for sequencing;Verbal cues for technique Transfers Sit to Stand: 4: Min guard;With armrests Stand to Sit: 4: Min assist;With armrests Stand to Sit Details (indicate cue type and reason): Verbal cues for technique Stand to Sit Details: poor eccentric control  Trunk/Postural Assessment  Cervical Assessment Cervical Assessment: Within Functional Limits Thoracic Assessment Thoracic Assessment: Exceptions to [facility] (increased kyphosis) Lumbar Assessment Lumbar Assessment: Exceptions to [facility] (reduced lumbar lordosis, posterior pelvic tilt) Postural Control Postural Control: Deficits on evaluation (delayed/inefficient stepping strategies)  Balance Dynamic Sitting Balance Dynamic Sitting - Level of Assistance: 5: Stand by assistance Static Standing Balance Static Standing - Level of Assistance: 4: Min assist Dynamic Standing Balance Dynamic Standing - Level of Assistance: 3: Mod assist Extremity/Trunk Assessment RUE Assessment RUE Assessment: Within Functional Limits LUE Assessment LUE Assessment: Within Functional Limits   See Function Navigator for Current Functional Status.   Refer to Care Plan for Long Term Goals  Recommendations for other services: None    Discharge Criteria: Patient will be discharged from OT if patient refuses treatment 3 consecutive times without medical reason, if treatment goals not met, if there is a change in medical status, if patient makes no  progress towards goals or if patient is discharged from hospital.  The above assessment, treatment plan, treatment alternatives and goals were discussed and mutually agreed upon: by patient and by family  [place] 08/09/2016, 12:48 PM

## 2016-08-09 NOTE — Progress Notes (Signed)
Occupational Therapy Session Note  Patient Details  Name: Isaac Villa MRN: [id number] Date of Birth: [birth year]  Today's Date: 08/09/2016 OT Group Time:  11:30- 11:54  24 min    Short Term Goals: Week 1:  OT Short Term Goal 1 (Week 1): STGs = LTGs  Skilled Therapeutic Interventions/Progress Updates:  Upon arrival, Pt asleep in bed with wife in room. Pt agreeable to tx. Pt reports no pain this session. Pt supine>EOB with supervision and requests to change pants. Pt stands with MIN A to advance pants over past hips with steadying assist once in standing. Pt demo difficulty initiating, sequencing and problem solving doffing pants. Pt requires question cueing during session to sequence steps. Pt able to thread B feet and stand with MIN A and steadying balance when pulling pants up. Pt completes stand pivot transfer into WC with MIN A for lifting and balance. Througout session OT discusses discharge planning with wife and refers wife to nurse for medication questions. Pt left in room with wife seated in w/c with all needs met and call light in place.   Therapy Documentation Precautions:  Precautions Precautions: Fall Precaution Comments: h/o recent falls.  No history of falls prior to 07/24/16 Restrictions Weight Bearing Restrictions: No LLE Weight Bearing: Weight bearing as tolerated Other Position/Activity Restrictions: has R knee brace if needed General:   Vital Signs: Therapy Vitals Temp: 97.6 F (36.4 C) Temp Source: Oral Pulse Rate: 81 Resp: 17 BP: 107/62 Patient Position (if appropriate): Lying Oxygen Therapy SpO2: 94 % O2 Device: Not Delivered Pain: Pain Assessment Pain Assessment: 0-10 Pain Score: 5  Pain Type: Surgical pain Pain Location: Hip Pain Orientation: Left Pain Descriptors / Indicators: Aching;Sore Pain Onset: On-going Pain Intervention(s): Rest ADL: ADL ADL Comments: refer to functional navigator Exercises:   Other Treatments:    See Function  Navigator for Current Functional Status.   Therapy/Group: Individual Therapy  [place] 08/09/2016, 2:23 PM

## 2016-08-09 NOTE — Evaluation (Signed)
Physical Therapy Assessment and Plan  Patient Details  Name: Isaac Villa MRN: [id number] Date of Birth: 18-Apr-1925  PT Diagnosis: Abnormality of gait, Cognitive deficits, Difficulty walking, Dizziness and giddiness, Muscle weakness and Pain in L hip Rehab Potential: Good ELOS: 7-10 days   Today's Date: 08/09/2016 PT Individual Time: 0800-0900 PT Individual Time Calculation (min): 60 min    Problem List:  Patient Active Problem List   Diagnosis Date Noted  . Left displaced femoral neck fracture ([place]) 08/08/2016  . Protein-calorie malnutrition, severe 08/03/2016  . Hip fracture ([place]) 08/02/2016  . BPH (benign prostatic hyperplasia) 08/02/2016  . Protein calorie malnutrition ([place]) 08/02/2016  . Closed hip fracture, left, initial encounter ([place])   . Physical deconditioning   . Traumatic hematoma of buttock 08/01/2016  . Rhabdomyolysis 08/01/2016  . Symptomatic anemia 07/27/2016  . Generalized weakness 07/27/2016  . Hyperkalemia 07/27/2016  . Acute kidney injury ([place]) 07/27/2016  . Elevated INR 07/27/2016  . Hematoma 07/27/2016  . Atrial fibrillation ([place]) [I48.91] 02/08/2016  . Long term (current) use of anticoagulants [Z79.01] 02/08/2016  . Edema extremities 06/04/2014  . Chronic anticoagulation 06/04/2013  . Thrombocytopenia ([place])   . Chronic atrial fibrillation ([place]) 05/30/2013  . Essential hypertension, benign 05/30/2013    Past Medical History:  Past Medical History:  Diagnosis Date  . Atrial fibrillation (H[facility])    Chronic  . BPH (benign prostatic hypertrophy)   . Edema extremities 06/04/2014  . Elevated cholesterol   . Fractured femoral neck ([place]) 08/02/2016   CLOSED FRACTURE  . Glaucoma   . Gout   . Hernia   . Herniated disc   . Hypertension   . Melanoma ([place])   . Melanoma ([place])   . Thrombocytopenia ([place])    Past Surgical History:  Past Surgical History:  Procedure Laterality Date  . ANTERIOR APPROACH HEMI HIP ARTHROPLASTY Left 08/05/2016   Procedure:  HEMI vs TOTAL HIP REPLACEMENT ANTERIOR APPROACH;  Surgeon: Paralee Cancel, MD;  Location: [facility];  Service: Orthopedics;  Laterality: Left;  . APPENDECTOMY    . CATARACT EXTRACTION Bilateral 04/2008  . EYE SURGERY     lens replacements  . HERNIA REPAIR    . JOINT REPLACEMENT     left knee  . [place] SURGERY  07/2004  . REPLACEMENT TOTAL KNEE Left 11/2006  . TONSILLECTOMY      Assessment & Plan Clinical Impression: Patient is a 83 y.o. year old male with recent admission to the hospital on 83 year old right-handed male with past medical history of left TKA10 years ago, gouty arthritis, chronic atrial fibrillation maintained on Coumadin, hypertension, melanoma. Patient recently admitted 07/27/2016-08/01/2016 due to symptomatic anemia secondary to right thigh hematoma which was sustained after a mechanical fall on anticoagulation with supra therapeuticINR. He was transfused and stabilized. Plan was to hold Coumadin for approximately 10 days. He was discharged to [facility] skilled nursing facility.Prior to late January admission patient lived with wife very independent and active prior to admission. Wife is of good health and can also assist as well as multiple family members.Presented 08/02/2016 after a fall while ambulating to the bathroom at the SNF rehab.He denied any chest pain dizziness or loss of consciousness. Complains of left hip pain. Cranial CT scan showed mild diffuse cortical atrophy. No acute intracranial abnormality. X-rays and imaging revealed left hip femoral neck fracture. Noted hemoglobin findings of 7.7 he was transfused preoperatively. Patient received cardiac clearance with echocardiogram showing ejection fraction of 55% no wall motion abnormalities and underwent anterior  approach left total hip arthroplasty 08/05/2016 per Dr. Charlann Boxer. Weightbearing as tolerated left lower extremity. Initially his chronic Coumadin for atrial fibrillation was resumed. Patient with acute delirium  postoperatively. MRI of the brain 08/06/2016 showed new subdural collection around the left cerebral convexity measuring up to 10 mm in thickness signal consistent with hygroma. Left cerebral mass effect was mild. No postoperative infarct. Neurosurgery Dr. Danielle Dess consulted and advised conservative care. Patient was cleared to begin low dose aspirin 81 mg daily but no Coumadin therapy. Close monitoring of hemoglobin latest of 9.0.  Patient transferred to CIR on 08/08/2016 .   Patient currently requires min with mobility secondary to muscle weakness, decreased cardiorespiratoy endurance, decreased problem solving, decreased safety awareness and decreased memory and decreased standing balance, decreased postural control and decreased balance strategies.  Prior to hospitalization, patient was modified independent  with mobility and lived with Spouse in a House home.  Home access is 3 STE at front door and in garage; rails both sidesStairs to enter.  Patient will benefit from skilled PT intervention to maximize safe functional mobility, minimize fall risk and decrease caregiver burden for planned discharge home with 24 hour supervision.  Anticipate patient will benefit from follow up OP at discharge.  PT - End of Session Activity Tolerance: Tolerates 30+ min activity with multiple rests Endurance Deficit: Yes Endurance Deficit Description: requires seated rest breaks following short duration mobility tasks PT Assessment Rehab Potential (ACUTE/IP ONLY): Good Barriers to Discharge: Decreased caregiver support PT Patient demonstrates impairments in the following area(s): Balance;Endurance;Motor;Pain;Safety;Skin Integrity PT Transfers Functional Problem(s): Bed Mobility;Bed to Chair;Car;Furniture PT Locomotion Functional Problem(s): Ambulation;Wheelchair Mobility;Stairs PT Plan PT Intensity: Minimum of 1-2 x/day ,45 to 90 minutes PT Frequency: 5 out of 7 days PT Duration Estimated Length of Stay: 7-10  days PT Treatment/Interventions: Warden/ranger;Ambulation/gait training;Therapeutic Exercise;Therapeutic Activities;Stair training;UE/LE Coordination activities;UE/LE Strength taining/ROM;Wheelchair propulsion/positioning;Neuromuscular re-education;DME/adaptive equipment instruction;Community reintegration;Discharge planning;Pain management;Skin care/wound management;Patient/family education;Functional mobility training;Disease management/prevention;Cognitive remediation/compensation PT Transfers Anticipated Outcome(s): modI PT Locomotion Anticipated Outcome(s): S with LRAD  PT Recommendation Recommendations for Other Services: Therapeutic Recreation consult Therapeutic Recreation Interventions: Outing/community reintergration Follow Up Recommendations: Outpatient PT Patient destination: Home Equipment Recommended: None recommended by PT Equipment Details: has RW  Skilled Therapeutic Intervention Pt received seated in bed with daughter present who then left for remainder of session. C/o pain as below and agreeable to treatment. Initial PT evaluation performed and completed with minA overall as described below. Pt with no significant increase in pain throughout session. Educated pt on goals of rehab at modI/S level overall, estimated length of stay, falls prevention safety and daily schedule. Pt agreeable to all the above. Remained seated in w/c with chair alarm intact at end of session, all needs in reach.   PT Evaluation Precautions/Restrictions Precautions Precautions: Fall Restrictions LLE Weight Bearing: Weight bearing as tolerated General Chart Reviewed: Yes Response to Previous Treatment: Not applicable Family/Caregiver Present: Yes (daughter present at beginning of session but had to leave) Vital Signs Pain Pain Assessment Pain Assessment: 0-10 Pain Score: 2  Faces Pain Scale: Hurts a little bit Pain Type: Surgical pain Pain Location: Hip Pain Orientation:  Left Pain Descriptors / Indicators: Aching;Sore Pain Onset: On-going Patients Stated Pain Goal: 0 Pain Intervention(s): Ambulation/increased activity Multiple Pain Sites: No Home Living/Prior Functioning Home Living Available Help at Discharge: Family;Available 24 hours/day Type of Home: House Home Access: Stairs to enter [employer] of Steps: 3 STE at front door and in garage; rails both sides Entrance Stairs-Rails: Right;Left;Can reach both Home  Layout: Two level;Able to live on main level with bedroom/bathroom;Full bath on main level  Lives With: Spouse Prior Function Level of Independence: Independent with gait;Independent with transfers;Independent with basic ADLs  Able to Take Stairs?: Yes Vocation: Retired Leisure: Hobbies-yes (Comment) Comments: prior to recent falls, pt was very independent.  He was active in the community, rides his elliptical daily, enjoys watching and tracking the stock market and is an avid reader  Vision/Perception    WFL; no change from baseline Cognition Overall Cognitive Status: Impaired/Different from baseline Arousal/Alertness: Awake/alert Problem Solving: Impaired Problem Solving Impairment: Verbal complex;Functional complex Comments: delayed processing Sensation Sensation Light Touch: Appears Intact Stereognosis: Not tested Hot/Cold: Not tested Proprioception: Appears Intact Coordination Gross Motor Movements are Fluid and Coordinated: No Heel Shin Test: slow, decreased range Motor  Motor Motor - Skilled Clinical Observations: generalized weakness  Mobility Bed Mobility Bed Mobility: Supine to Sit;Sit to Supine Supine to Sit: 5: Supervision Supine to Sit Details: Verbal cues for precautions/safety;Verbal cues for technique Sit to Supine: 5: Supervision Sit to Supine - Details: Verbal cues for sequencing;Verbal cues for technique Transfers Transfers: Yes Sit to Stand: 4: Min guard;With armrests Stand to Sit: 4: Min  assist;With armrests Stand to Sit Details (indicate cue type and reason): Verbal cues for technique Stand to Sit Details: poor eccentric control Stand Pivot Transfers: 4: Min assist Locomotion  Ambulation Ambulation: Yes Ambulation/Gait Assistance: 4: Min guard Ambulation Distance (Feet): 90 Feet Assistive device: Rolling walker Stairs / Additional Locomotion Stairs: Yes Stairs Assistance: 4: Min guard Stair Management Technique: Two rails;Step to pattern;Forwards Number of Stairs: 12 Height of Stairs: 3 Architect: Yes Wheelchair Assistance: 5: Investment banker, operational Details: Verbal cues for Marketing executive: Both upper extremities Wheelchair Parts Management: Needs assistance Distance: 150'  Trunk/Postural Assessment  Cervical Assessment Cervical Assessment: Within Functional Limits Thoracic Assessment Thoracic Assessment: Exceptions to [facility] (increased kyphosis) Lumbar Assessment Lumbar Assessment: Exceptions to [facility] (reduced lumbar lordosis, posterior pelvic tilt) Postural Control Postural Control: Deficits on evaluation (delayed/inefficient stepping strategies)  Balance Balance Balance Assessed: Yes Static Sitting Balance Static Sitting - Balance Support: No upper extremity supported;Feet supported Static Sitting - Level of Assistance: 6: Modified independent (Device/Increase time) Dynamic Sitting Balance Dynamic Sitting - Balance Support: No upper extremity supported;Feet supported Dynamic Sitting - Level of Assistance: 5: Stand by assistance Dynamic Sitting - Balance Activities: Lateral lean/weight shifting;Forward lean/weight shifting;Reaching across midline Static Standing Balance Static Standing - Balance Support: Bilateral upper extremity supported;During functional activity Static Standing - Level of Assistance: 5: Stand by assistance Dynamic Standing Balance Dynamic Standing - Balance Support: Bilateral  upper extremity supported;During functional activity Dynamic Standing - Level of Assistance: 4: Min assist Extremity Assessment  RUE Assessment RUE Assessment: Within Functional Limits LUE Assessment LUE Assessment: Within Functional Limits RLE Assessment RLE Assessment: Exceptions to [facility] (generalized weakness, 4/5 throughout) LLE Assessment LLE Assessment: Exceptions to [facility] (generalized weakness, 4/5 throughout)   See Function Navigator for Current Functional Status.   Refer to Care Plan for Long Term Goals  Recommendations for other services: Therapeutic Recreation  Outing/community reintegration  Discharge Criteria: Patient will be discharged from PT if patient refuses treatment 3 consecutive times without medical reason, if treatment goals not met, if there is a change in medical status, if patient makes no progress towards goals or if patient is discharged from hospital.  The above assessment, treatment plan, treatment alternatives and goals were discussed and mutually agreed upon: by patient  Luberta Mutter 08/09/2016, 10:21  AM  

## 2016-08-09 NOTE — Progress Notes (Signed)
Occupational Therapy Session Note  Patient Details  Name: Isaac Villa MRN: [id number] Date of Birth: 1924/07/12  Today's Date: 08/09/2016 OT Individual Time: 1412-1500 OT Individual Time Calculation (min): 48 min    Short Term Goals: Week 1:  OT Short Term Goal 1 (Week 1): STGs = LTGs  Skilled Therapeutic Interventions/Progress Updates:  Pt seen for OT session focusing on functional transfers and sit <> stand. Pt in supine upon arrival, voicing increased fatigue from earlier sessions. With encouragement, pt willing to attempt therapy. He transferred to EOB with use of bed rails and VCs for technique. He ambulated into bathroom with RW and CGA, VCs for RW management. He required 1 VC for clothing management during toileting as he pulled pants down, however, forgot to pull down underwear.  He declined ambulation to/from gym. He self propelled w/c 75% of way to gym and then assist provided for remainder of way due to fatigue. Stand pivots completed throughout session with guarding assist. Worked on sit <> Stands from EOM without UE support from highly elevated mat. Pt requiring hand held assist from EOM to stand. VCs provided for rocking technique, and "nose to toes" technique. Pt returned to room at end of session. Left in supine with all needs in reach and wife present.    Therapy Documentation Precautions:  Precautions Precautions: Fall Precaution Comments: h/o recent falls.  No history of falls prior to 07/24/16 Restrictions Weight Bearing Restrictions: No LLE Weight Bearing: Weight bearing as tolerated Other Position/Activity Restrictions: has R knee brace if needed ADL: ADL ADL Comments: refer to functional navigator  See Function Navigator for Current Functional Status.   Therapy/Group: Individual Therapy  [name], Lerin Jech C 08/09/2016, 3:35 PM

## 2016-08-09 NOTE — H&P (Signed)
Physical Medicine and Rehabilitation Admission H&P       Chief Complaint  Patient presents with  . Fall  . Hip Pain  : HPI: 83 year old right-handed male with past medical history of left TKA10 years ago, gouty arthritis, chronic atrial fibrillation maintained on Coumadin, hypertension, melanoma. Patient recently admitted 07/27/2016-08/01/2016 due to symptomatic anemia secondary to right thigh hematoma which was sustained after a mechanical fall on anticoagulation with supratherapeutic INR. He was transfused and stabilized. Plan was to hold Coumadin for approximately 10 days. He was discharged to [facility] skilled nursing facility.Prior to late January admission patient lived with wife very independent and active prior to admission. Wife is of good health and can also assist as well as multiple family members.Presented 08/02/2016 after a fall while ambulating to the bathroom. He denied any chest pain dizziness or loss of consciousness. Complains of left hip pain. Cranial CT scan showed mild diffuse cortical atrophy. No acute intracranial abnormality. X-rays and imaging revealed left hip femoral neck fracture. Noted hemoglobin findings of 7.7 he was transfused preoperatively. Patient received cardiac clearance with echocardiogram showing ejection fraction of 55% no wall motion abnormalities and underwent anterior approach left total hip arthroplasty 08/05/2016 per Dr. Alvan Dame. Weightbearing as tolerated left lower extremity. Initially his chronic Coumadin for atrial fibrillation was resumed. Patient with acute delirium postoperatively. MRI of the brain 08/06/2016 showed new subdural collection around the left cerebral convexity measuring up to 10 mm in thickness signal consistent with hygroma. Left cerebral mass effect was mild. No postoperative infarct. Neurosurgery Dr. Ellene Route consulted and advised conservative care. Patient was cleared to begin low dose aspirin 81 mg daily but no Coumadin  therapy. Close monitoring of hemoglobin latest of 9.0. Physical and occupational therapy evaluations completed 08/06/2016 with recommendations of physical medicine rehabilitation consult.Patient was admitted for a comprehensive rehabilitation program  ROS Constitutional: Negative for fever.  HENT: Negative for hearing loss.  Eyes: Negative for blurred vision, double visionand discharge.  Respiratory: Negative for coughand shortness of breath.  Cardiovascular: Positive for palpitations. Negative for chest painand leg swelling.  Gastrointestinal: Positive for constipation. Negative for nauseaand vomiting.  Genitourinary: Positive for urgency. Negative for dysuriaand flank pain.  Musculoskeletal: Positive for back pain, fallsand myalgias.  Skin: Negative for rash.  Neurological: Positive for weakness. Negative for dizziness, seizures, loss of consciousnessand headaches.  All other systems reviewed and are negative       Past Medical History:  Diagnosis Date  . Atrial fibrillation (HCC)    Chronic  . BPH (benign prostatic hypertrophy)   . Edema extremities 06/04/2014  . Elevated cholesterol   . Fractured femoral neck ([place]) 08/02/2016   CLOSED FRACTURE  . Glaucoma   . Gout   . Hernia   . Herniated disc   . Hypertension   . Melanoma ([place])   . Melanoma ([place])   . Thrombocytopenia ([place])         Past Surgical History:  Procedure Laterality Date  . APPENDECTOMY    . CATARACT EXTRACTION Bilateral 04/2008  . EYE SURGERY     lens replacements  . HERNIA REPAIR    . JOINT REPLACEMENT     left knee  . [place] SURGERY  07/2004  . REPLACEMENT TOTAL KNEE Left 11/2006  . TONSILLECTOMY          Family History  Problem Relation Age of Onset  . Hypertension Mother   . Prostate cancer Father    Social History:  reports that he has  quit smoking. He has never used smokeless tobacco. He reports that he drinks alcohol. He reports that he does not  use drugs. Allergies:       Allergies  Allergen Reactions  . Penicillins Anaphylaxis and Other (See Comments)    SYNCOPE > "Causes pt to pass out "  Has patient had a PCN reaction causing immediate rash, facial/tongue/throat swelling, SOB or lightheadedness with hypotension:  #  #  #  YES  #  #  #  Has patient had a PCN reaction causing severe rash involving mucus membranes or skin necrosis: No Has patient had a PCN reaction that required hospitalization #  #  #  YES  #  #  #  Has patient had a PCN reaction occurring within the last 10 years: No  . Enoxaparin Other (See Comments)    REACTION IS DOSE RELATED >>"Internal bleeding"         Medications Prior to Admission  Medication Sig Dispense Refill  . acetaminophen (TYLENOL) 650 MG CR tablet Take 1,300 mg by mouth every 8 (eight) hours as needed for pain.    Marland Kitchen allopurinol (ZYLOPRIM) 300 MG tablet Take 300 mg by mouth daily at 12 noon.     . Calcium Carb-Cholecalciferol (CALCIUM + D3) 600-200 MG-UNIT TABS Take 1 tablet by mouth daily at 12 noon.     . digoxin (LANOXIN) 0.125 MG tablet Take 0.125 mg by mouth daily.    Marland Kitchen diltiazem (CARDIZEM CD) 240 MG 24 hr capsule Take 240 mg by mouth daily at 12 noon.    . docusate sodium (COLACE) 100 MG capsule Take 1 capsule (100 mg total) by mouth 2 (two) times daily. 10 capsule 0  . finasteride (PROSCAR) 5 MG tablet Take 5 mg by mouth at bedtime.     . hydrochlorothiazide (HYDRODIURIL) 25 MG tablet Take 25 mg by mouth daily.    Marland Kitchen HYDROcodone-acetaminophen (NORCO/VICODIN) 5-325 MG tablet Take 1 tablet by mouth every 6 (six) hours as needed for moderate pain. 15 tablet 0  . lactose free nutrition (BOOST PLUS) LIQD Take 237 mLs by mouth 3 (three) times daily between meals. 90 Can 0  . latanoprost (XALATAN) 0.005 % ophthalmic solution Place 1 drop into both eyes at bedtime.     Marland Kitchen lisinopril (PRINIVIL,ZESTRIL) 10 MG tablet Take 1 tablet (10 mg total) by mouth daily. 90 tablet 2  .  magnesium oxide (MAGNESIUM-OXIDE) 400 (241.3 Mg) MG tablet Take 400 mg by mouth daily at 12 noon.    . Multiple Vitamin (MULTIVITAMIN WITH MINERALS) TABS tablet Take 1 tablet by mouth daily at 12 noon.    Marland Kitchen omega-3 acid ethyl esters (LOVAZA) 1 g capsule Take 1 g by mouth daily at 12 noon.    . potassium chloride SA (K-DUR,KLOR-CON) 20 MEQ tablet Take 20 mEq by mouth 2 (two) times daily.    . [EXPIRED] predniSONE (DELTASONE) 20 MG tablet Take 2 tablets (40 mg total) by mouth daily. 8 tablet 0  . tamsulosin (FLOMAX) 0.4 MG CAPS capsule Take 0.4 mg by mouth daily.    Marland Kitchen warfarin (COUMADIN) 4 MG tablet Take 4 mg by mouth daily.  4    Home: Home Living Family/patient expects to be discharged to:: Inpatient rehab Living Arrangements: Spouse/significant other Available Help at Discharge: Family, Available 24 hours/day Type of Home: House Home Access: Stairs to enter [employer] of Steps:  (thats the front door;) Entrance Stairs-Rails: Right, Left, Can reach both Home Layout: Two level, Able to live  on main level with bedroom/bathroom, Full bath on main level (his office is upstairs that he uses daily) Alternate Level Stairs-Number of Steps: a flight upstairs then a landing and then 4 steps Alternate Level Stairs-Rails: Left Bathroom Shower/Tub: Chiropodist: Standard Bathroom Accessibility: Yes Home Equipment: Walker - 2 wheels, Other (comment) Additional Comments: Pt has very supportive family   Lives With: Spouse   Functional History: Prior Function Level of Independence: Independent Comments: prior to recent falls, pt was very independent.  He was active in the community, rides his elliptical daily, enjoys watching and tracking the stock market and is an avid reader   Functional Status:  Mobility: Bed Mobility Overal bed mobility: Needs Assistance Bed Mobility: Supine to Sit Sidelying to sit: Mod assist, +2 for safety/equipment Supine to  sit: Min assist, HOB elevated Sit to supine: Min assist, +2 for safety/equipment General bed mobility comments: Pt requires assist to lift trunk from bed and mod cues for technique and hand placement.  HOB elevated and pt relying heavily on railings for leverage at trunk.  Transfers Overall transfer level: Needs assistance Equipment used: Rolling walker (2 wheeled) Transfers: Sit to/from Stand Sit to Stand: +2 safety/equipment, Min guard, From elevated surface Stand pivot transfers: Min assist General transfer comment: Min guard assist for balance and stability of RW during transitions from elevated bed. Second person for safety, but ultimately not needed.   Ambulation/Gait Ambulation/Gait assistance: +2 safety/equipment, Min assist Ambulation Distance (Feet): 65 Feet Assistive device: Rolling walker (2 wheeled) Gait Pattern/deviations: Step-to pattern, Antalgic, Trunk flexed General Gait Details: Pt needed min assist for balance (less posterior preference today), and assist to steer RW.  Second person follwed with chair to encourage increased gait distance and safety.  Verbal cues for upright posture.  Gait velocity: decreased Gait velocity interpretation: Below normal speed for age/gender  ADL: ADL Overall ADL's : Needs assistance/impaired Eating/Feeding: Set up, Sitting Eating/Feeding Details (indicate cue type and reason): Per family, pt with poor appetite  Grooming: Wash/dry hands, Wash/dry face, Oral care, Brushing hair, Set up, Sitting Upper Body Bathing: Minimal assistance, Sitting Lower Body Bathing: Moderate assistance, Sit to/from stand Lower Body Bathing Details (indicate cue type and reason): mod A  Upper Body Dressing : Sitting, Minimal assistance Lower Body Dressing: Moderate assistance, Sit to/from stand Lower Body Dressing Details (indicate cue type and reason): Pt able to doff socks.  he requires max A to don them, and wife reports she does this for him at home.   Anticipate he will be able to don pants with mod A  Toilet Transfer: Minimal assistance, +2 for safety/equipment, Ambulation, Regular Toilet, BSC, Grab bars, RW Toileting- Clothing Manipulation and Hygiene: Maximal assistance, Sit to/from stand Toileting - Clothing Manipulation Details (indicate cue type and reason): Pt had incontintent episode of diarrhea and still with some residual stool on peri area.  Assisted him with peri care in standing  Functional mobility during ADLs: Minimal assistance, +2 for safety/equipment, Rolling walker General ADL Comments: pt more appropriate and engaged this date.  He continues to complain of dizziness, but reports it is improved.   Cognition: Cognition Overall Cognitive Status: Impaired/Different from baseline Orientation Level: Oriented to person, Oriented to time, Disoriented to place, Disoriented to situation Cognition Arousal/Alertness: Awake/alert Behavior During Therapy: WFL for tasks assessed/performed Overall Cognitive Status: Impaired/Different from baseline Area of Impairment: Attention, Memory, Following commands, Safety/judgement, Problem solving Current Attention Level: Selective Memory: Decreased short-term memory Following Commands: Follows multi-step commands inconsistently Safety/Judgement: Decreased awareness  of safety Problem Solving: Difficulty sequencing, Requires verbal cues, Requires tactile cues General Comments: Pt more appropriate today.  Still with some confusion   Physical Exam: Blood pressure 130/66, pulse 73, temperature 98.6 F (37 C), temperature source Oral, resp. rate 16, SpO2 97 %. Physical Exam  Vitals reviewed. Constitutional: He appears well-developed. No distress.  HENT:  Head: Normocephalic and atraumatic.  Eyes: EOM are normal. Left eye exhibits no discharge.  Neck: Normal range of motion. Neck supple. No JVD present. No tracheal deviation present. No thyromegaly present.  Cardiovascular: Normal rate and  regular rhythm.  Exam reveals no gallop and no friction rub.   No murmur heard. Cardiac rate controlled  Respiratory: Effort normal and breath sounds normal. No respiratory distress. He has no wheezes. He has no rales. He exhibits no tenderness.  GI: Soft. Bowel sounds are normal. He exhibits no distension. There is no tenderness. There is no rebound and no guarding.  Musculoskeletal: Edema: edema left thigh,lower extremity.  Neurological: He is alert. No cranial nerve deficit. Coordination normal.  Oriented to month, place and person  Follows simple commands. Motor 4/5 to 5/5 bilateral upper exts. LLE  HF limited by pain--able to rotate the limb in bed, bends knee at least 3- to 3/5. ADF/PF 4/5. RLE 3/5 HF (with some pain inhibition), 4/5 KE and 5/5 ADF/PF. No sensory deficits Skin: bruising on both thighs. Left incision dry/post-op dressing in place Psych: pleasant and cooperative.     Lab Results Last 48 Hours        Results for orders placed or performed during the hospital encounter of 08/02/16 (from the past 48 hour(s))  CBC     Status: Abnormal   Collection Time: 08/07/16  4:15 AM  Result Value Ref Range   WBC 14.1 (H) 4.0 - 10.5 K/uL   RBC 3.02 (L) 4.22 - 5.81 MIL/uL   Hemoglobin 9.0 (L) 13.0 - 17.0 g/dL   HCT 28.2 (L) 39.0 - 52.0 %   MCV 93.4 78.0 - 100.0 fL   MCH 29.8 26.0 - 34.0 pg   MCHC 31.9 30.0 - 36.0 g/dL   RDW 16.9 (H) 11.5 - 15.5 %   Platelets 201 150 - 400 K/uL  Basic metabolic panel     Status: Abnormal   Collection Time: 08/07/16  4:15 AM  Result Value Ref Range   Sodium 140 135 - 145 mmol/L   Potassium 3.8 3.5 - 5.1 mmol/L   Chloride 112 (H) 101 - 111 mmol/L   CO2 25 22 - 32 mmol/L   Glucose, Bld 100 (H) 65 - 99 mg/dL   BUN 31 (H) 6 - 20 mg/dL   Creatinine, Ser 0.96 0.61 - 1.24 mg/dL   Calcium 7.8 (L) 8.9 - 10.3 mg/dL   GFR calc non Af Amer >60 >60 mL/min   GFR calc Af Amer >60 >60 mL/min    Comment: (NOTE) The eGFR has been  calculated using the CKD EPI equation. This calculation has not been validated in all clinical situations. eGFR's persistently <60 mL/min signify possible Chronic Kidney Disease.    Anion gap 3 (L) 5 - 15  Protime-INR     Status: Abnormal   Collection Time: 08/07/16  4:15 AM  Result Value Ref Range   Prothrombin Time 15.4 (H) 11.4 - 15.2 seconds   INR 1.21   Protime-INR     Status: Abnormal   Collection Time: 08/08/16  6:54 AM  Result Value Ref Range   Prothrombin Time 15.7 (H)  11.4 - 15.2 seconds   INR 1.25   CBC with Differential/Platelet     Status: Abnormal   Collection Time: 08/08/16  6:54 AM  Result Value Ref Range   WBC 8.9 4.0 - 10.5 K/uL   RBC 2.80 (L) 4.22 - 5.81 MIL/uL   Hemoglobin 8.2 (L) 13.0 - 17.0 g/dL   HCT 26.1 (L) 39.0 - 52.0 %   MCV 93.2 78.0 - 100.0 fL   MCH 29.3 26.0 - 34.0 pg   MCHC 31.4 30.0 - 36.0 g/dL   RDW 16.9 (H) 11.5 - 15.5 %   Platelets 169 150 - 400 K/uL   Neutrophils Relative % 83 %   Neutro Abs 7.4 1.7 - 7.7 K/uL   Lymphocytes Relative 6 %   Lymphs Abs 0.5 (L) 0.7 - 4.0 K/uL   Monocytes Relative 9 %   Monocytes Absolute 0.8 0.1 - 1.0 K/uL   Eosinophils Relative 2 %   Eosinophils Absolute 0.2 0.0 - 0.7 K/uL   Basophils Relative 0 %   Basophils Absolute 0.0 0.0 - 0.1 K/uL  Comprehensive metabolic panel     Status: Abnormal   Collection Time: 08/08/16  6:54 AM  Result Value Ref Range   Sodium 142 135 - 145 mmol/L   Potassium 3.6 3.5 - 5.1 mmol/L   Chloride 109 101 - 111 mmol/L   CO2 22 22 - 32 mmol/L   Glucose, Bld 92 65 - 99 mg/dL   BUN 23 (H) 6 - 20 mg/dL   Creatinine, Ser 0.87 0.61 - 1.24 mg/dL   Calcium 7.9 (L) 8.9 - 10.3 mg/dL   Total Protein 4.4 (L) 6.5 - 8.1 g/dL   Albumin 2.1 (L) 3.5 - 5.0 g/dL   AST 20 15 - 41 U/L   ALT 12 (L) 17 - 63 U/L   Alkaline Phosphatase 58 38 - 126 U/L   Total Bilirubin 1.7 (H) 0.3 - 1.2 mg/dL   GFR calc non Af Amer >60 >60 mL/min   GFR calc Af Amer  >60 >60 mL/min    Comment: (NOTE) The eGFR has been calculated using the CKD EPI equation. This calculation has not been validated in all clinical situations. eGFR's persistently <60 mL/min signify possible Chronic Kidney Disease.    Anion gap 11 5 - 15  Magnesium     Status: None   Collection Time: 08/08/16  6:54 AM  Result Value Ref Range   Magnesium 1.9 1.7 - 2.4 mg/dL  Phosphorus     Status: Abnormal   Collection Time: 08/08/16  6:54 AM  Result Value Ref Range   Phosphorus 2.2 (L) 2.5 - 4.6 mg/dL      Imaging Results (Last 48 hours)  Mr Brain Wo Contrast  Result Date: 08/06/2016 CLINICAL DATA:  Confusion since hip surgery February 3rd. EXAM: MRI HEAD WITHOUT CONTRAST TECHNIQUE: Multiplanar, multiecho pulse sequences of the brain and surrounding structures were obtained without intravenous contrast. COMPARISON:  08/02/2016 head CT FINDINGS: Brain: Interval development of subdural collection around the left cerebral hemisphere which has MR features most compatible with hygroma. The collection measures up to 10 mm in thickness in the frontal region, mass effect on the left cerebral hemisphere is mild in the setting of atrophy. At baseline the septum pellucidum is right of midline, and this appearance is mildly accentuated with the mass-effect. No brain edema. Incidental fatty mass along the right tectum measuring 7 mm. Mild generalized volume loss. Mild periventricular chronic microvascular ischemia. No acute infarct or hydrocephalus Vascular:  Preserved flow voids Skull and upper cervical spine: Negative Sinuses/Orbits: Bilateral cataract resection. No posttraumatic finding Other: These results will be called to the ordering clinician or representative by the Radiologist Assistant, and communication documented in the PACS or zVision Dashboard. IMPRESSION: 1. New subdural collection around the left cerebral convexity measuring up to 10 mm in thickness, signal most consistent with  hygroma. Left cerebral mass effect is mild. CT follow-up is recommended. 2. No postoperative infarct. 3. Incidental tectal lipoma. Electronically Signed   By: Monte Fantasia M.D.   On: 08/06/2016 16:59        Medical Problem List and Plan: 1. Decreased functional mobilitysecondary to left subdural hygroma and left femoral neck fracture status post left hip replacement anterior approach 28/36/6294 complicated by findings of left frontal subdural hygroma. Weightbearing as tolerated             -admit to inpatient rehab 2. DVT Prophylaxis/Anticoagulation: SCDs. Check vascular study 3. Pain Management: Patient currently using only Tylenol for pain due to postoperative delirium related to narcotics 4. Mood/delirium: Provide emotional support as appropriate 5. Neuropsych: This patient iscapable of making decisions on hisown behalf. 6. Skin/Wound Care: Routine skin checks 7. Fluids/Electrolytes/Nutrition: Routine I&O with follow-up chemistries upon admit             -encourage PO. Appetite beginning to pick up 8.Right thigh hematoma secondary to fall. Conservative care 9.Atrial fibrillation. Cardiac rate control. Follow-up per cardiology services. No current plan to resume Coumadin therapy. Lanoxin 0.125 mg daily, Cardizem CD 240 mg daily 10.Hypertension. HCTZ 25 mg daily, lisinopril 10 mg daily. Monitor with increased mobility 11.Acute on chronic anemia. Follow-up CBC. Continue iron supplement 12.History of gout. Zyloprim 300 mg daily. Monitor for any signs of flareup 13.BPH. Proscar5 mg daily, Flomax 0.4 mg daily. Check PVR 3 14.Hyperlipidemia.Lovaza1 mg daily 15.Constipation. Laxative assistance      Post Admission Physician Evaluation: 1. Functional deficits secondary  to left subdural hygroma and left FNF s/p THA. 2. Patient is admitted to receive collaborative, interdisciplinary care between the physiatrist, rehab nursing staff, and therapy team. 3. Patient's  level of medical complexity and substantial therapy needs in context of that medical necessity cannot be provided at a lesser intensity of care such as a SNF. 4. Patient has experienced substantial functional loss from his/her baseline which was documented above under the "Functional History" and "Functional Status" headings.  Judging by the patient's diagnosis, physical exam, and functional history, the patient has potential for functional progress which will result in measurable gains while on inpatient rehab.  These gains will be of substantial and practical use upon discharge  in facilitating mobility and self-care at the household level. 5. Physiatrist will provide 24 hour management of medical needs as well as oversight of the therapy plan/treatment and provide guidance as appropriate regarding the interaction of the two. 6. The Preadmission Screening has been reviewed and patient status is unchanged unless otherwise stated above. 7. 24 hour rehab nursing will assist with bladder management, bowel management, safety, skin/wound care, disease management, medication administration, pain management and patient education  and help integrate therapy concepts, techniques,education, etc. 8. PT will assess and treat for/with: Lower extremity strength, range of motion, stamina, balance, functional mobility, safety, adaptive techniques and equipment, ortho precautions, pain control, family education.   Goals are: mod I. 9. OT will assess and treat for/with: ADL's, functional mobility, safety, upper extremity strength, adaptive techniques and equipment, pain mgt, ortho precautions, community reintegration, family ed.   Goals  are: mod I to min assist. Therapy may proceed with showering this patient. 10. SLP will assess and treat for/with: cognition, communication, education.  Goals are: mod I. 11. Case Management and Social Worker will assess and treat for psychological issues and discharge planning. 12. Team  conference will be held weekly to assess progress toward goals and to determine barriers to discharge. 13. Patient will receive at least 3 hours of therapy per day at least 5 days per week. 14. ELOS: 10-13 days       15. Prognosis:  excellent     Meredith Staggers, MD, [place] [facility] 08/08/2016  Cathlyn Parsons., PA-C 08/08/2016     Note and exam completed on 08/08/16

## 2016-08-10 ENCOUNTER — Inpatient Hospital Stay (HOSPITAL_COMMUNITY): Payer: Medicare Other | Admitting: Physical Therapy

## 2016-08-10 ENCOUNTER — Inpatient Hospital Stay (HOSPITAL_COMMUNITY): Payer: Medicare Other

## 2016-08-10 ENCOUNTER — Inpatient Hospital Stay (HOSPITAL_COMMUNITY): Payer: Medicare Other | Admitting: Occupational Therapy

## 2016-08-10 ENCOUNTER — Inpatient Hospital Stay (HOSPITAL_COMMUNITY): Payer: Medicare Other | Admitting: Speech Pathology

## 2016-08-10 DIAGNOSIS — M79609 Pain in unspecified limb: Secondary | ICD-10-CM

## 2016-08-10 MED ORDER — MEGESTROL ACETATE 400 MG/10ML PO SUSP
400.0000 mg | Freq: Every day | ORAL | Status: DC
  Administered 2016-08-10 – 2016-08-13 (×3): 400 mg via ORAL
  Filled 2016-08-10 (×6): qty 10

## 2016-08-10 MED ORDER — TRAZODONE HCL 50 MG PO TABS
25.0000 mg | ORAL_TABLET | Freq: Every day | ORAL | Status: DC
  Administered 2016-08-13 – 2016-08-14 (×2): 25 mg via ORAL
  Filled 2016-08-10 (×5): qty 1

## 2016-08-10 NOTE — Plan of Care (Signed)
Problem: RH SKIN INTEGRITY Goal: RH STG SKIN FREE OF INFECTION/BREAKDOWN Pt will maintain intact skin free of infection with moderate assistance.  Outcome: Progressing Pt will indicate when and if skin becomes wet, pt will change positions frequently or call for assistance to do so.  Monitoring skin for breakdown frequently.

## 2016-08-10 NOTE — Progress Notes (Signed)
VASCULAR LAB PRELIMINARY  PRELIMINARY  PRELIMINARY  PRELIMINARY  Bilateral lower extremity venous duplex completed.    Preliminary report: Bilateral:  No evidence of DVT, superficial thrombosis, or Baker's Cyst.   Jaquana Geiger, RVS 08/10/2016, 8:49 AM

## 2016-08-10 NOTE — Progress Notes (Signed)
Social Work  Social Work Assessment and Plan  Patient Details  Name: Isaac Villa MRN: [id number] Date of Birth: April 01, 1925  Today's Date: 08/10/2016  Problem List:  Patient Active Problem List   Diagnosis Date Noted  . Left displaced femoral neck fracture ([place]) 08/08/2016  . Protein-calorie malnutrition, severe 08/03/2016  . Hip fracture ([place]) 08/02/2016  . BPH (benign prostatic hyperplasia) 08/02/2016  . Protein calorie malnutrition ([place]) 08/02/2016  . Closed hip fracture, left, initial encounter ([place])   . Physical deconditioning   . Traumatic hematoma of buttock 08/01/2016  . Rhabdomyolysis 08/01/2016  . Symptomatic anemia 07/27/2016  . Generalized weakness 07/27/2016  . Hyperkalemia 07/27/2016  . Acute kidney injury ([place]) 07/27/2016  . Elevated INR 07/27/2016  . Hematoma 07/27/2016  . Atrial fibrillation ([place]) [I48.91] 02/08/2016  . Long term (current) use of anticoagulants [Z79.01] 02/08/2016  . Edema extremities 06/04/2014  . Chronic anticoagulation 06/04/2013  . Thrombocytopenia ([place])   . Chronic atrial fibrillation ([place]) 05/30/2013  . Essential hypertension, benign 05/30/2013   Past Medical History:  Past Medical History:  Diagnosis Date  . Atrial fibrillation (HCC)    Chronic  . BPH (benign prostatic hypertrophy)   . Edema extremities 06/04/2014  . Elevated cholesterol   . Fractured femoral neck ([place]) 08/02/2016   CLOSED FRACTURE  . Glaucoma   . Gout   . Hernia   . Herniated disc   . Hypertension   . Melanoma ([place])   . Melanoma ([place])   . Thrombocytopenia ([place])    Past Surgical History:  Past Surgical History:  Procedure Laterality Date  . ANTERIOR APPROACH HEMI HIP ARTHROPLASTY Left 08/05/2016   Procedure: HEMI vs TOTAL HIP REPLACEMENT ANTERIOR APPROACH;  Surgeon: Paralee Cancel, MD;  Location: [facility];  Service: Orthopedics;  Laterality: Left;  . APPENDECTOMY    . CATARACT EXTRACTION Bilateral 04/2008  . EYE SURGERY     lens replacements  . HERNIA  REPAIR    . JOINT REPLACEMENT     left knee  . [place] SURGERY  07/2004  . REPLACEMENT TOTAL KNEE Left 11/2006  . TONSILLECTOMY     Social History:  reports that he has quit smoking. He has never used smokeless tobacco. He reports that he drinks alcohol. He reports that he does not use drugs.  Family / Support Systems Marital Status: Married Patient Roles: Spouse, Parent, Volunteer Spouse/Significant Other: wife, Joss Rothermel @ [phone] Children: daughter, Baker Janus @ [phone];  daughter, Eustaquio Maize @ [phone] and son, Eulas Post @ [phone] Anticipated Caregiver: wife, daughters and hired asisst if needed Ability/Limitations of Caregiver: wife 36 years old and very independent and active Caregiver Availability: 24/7 Family Dynamics: All family very supportive.  Wife and daughter, Baker Janus, at bedside and engaged in assessment interview.  Social History Preferred language: English Religion: Protestant Cultural Background: NA Education: college Read: Yes Write: Yes Employment Status: Retired Freight forwarder Issues: None Guardian/Conservator: None- per MD, pt is capable of making decisions on his own behalf   Abuse/Neglect Physical Abuse: Denies Verbal Abuse: Denies Sexual Abuse: Denies Exploitation of patient/patient's resources: Denies Self-Neglect: Denies  Emotional Status Pt's affect, behavior adn adjustment status: Pt lying in bed and has several, general c/o, as wife and daughter point out that this is his personality.  He offers reasons that therapies are expecting "too much...Marland KitchenI'm 83 years old and I have bad knees...but I'll do it."  He denies any significant emotional distress - will monitor.   Recent Psychosocial Issues: None  prior to fall in Jan. Pyschiatric History: None Substance Abuse History: None  Patient / Family Perceptions, Expectations & Goals Pt/Family understanding of illness & functional limitations: Pt and family with good understanding of  his hip fx and "bump on my head".  Pt aware of his functional limitations/ need for CIR. Premorbid pt/family roles/activities: Pt was independent PTA initial fall Anticipated changes in roles/activities/participation: Wife to provide primary caregiver support. Pt/family expectations/goals: "I want to be out of here by Tuesday."  [employer]: None Premorbid Home Care/DME Agencies: Other (Comment) [facility] after TKR) Transportation available at discharge: yes  Discharge Planning Living Arrangements: Spouse/significant other Support Systems: Spouse/significant other, Children, Friends/neighbors Type of Residence: Private residence Insurance Resources: [employer], Multimedia programmer (specify) Scientist, clinical (histocompatibility and immunogenetics)) Financial Resources: [place] Referred: No Living Expenses: Own Money Management: Patient Does the patient have any problems obtaining your medications?: No Home Management: pt and wife Patient/Family Preliminary Plans: Pt to d/c home with wife providing any needed support.  Local family also available to assist intermittently Social Work Anticipated Follow Up Needs: HH/OP Expected length of stay: 7 days  Clinical Impression Elderly gentleman here after a fall at local SNF and with hip fx and TBI.  Doing well overall and team anticipates short LOS - pt and family pleased about this.  Pt denies any emotional distress - notes a lot of frustration with expectations "of a 83 yr old man" and the discomfort of his hospital bed.  Good progress physically and cognitively.  Wife in good health and several local children/ grandchildren who are also supportive.  Will follow for d/c planning needs.  Skya Mccullum 08/10/2016, 3:11 PM

## 2016-08-10 NOTE — Progress Notes (Signed)
[facility] PHYSICAL MEDICINE & REHABILITATION     PROGRESS NOTE    Subjective/Complaints: Upset, getting "stir crazy". Wants to go home. "I can't stay any longer than Tuesday". Having problems sleeping and has poor appetite.   ROS: pt denies nausea, vomiting, diarrhea, cough, shortness of breath or chest pain    Objective: Vital Signs: Blood pressure 122/74, pulse 68, temperature 98 F (36.7 C), temperature source Oral, resp. rate 18, height 6\' 2"  (1.88 m), weight 78.9 kg (174 lb), SpO2 94 %. No results found.  Recent Labs  08/08/16 0654 08/09/16 1051  WBC 8.9 14.9*  HGB 8.2* 10.7*  HCT 26.1* 34.0*  PLT 169 278    Recent Labs  08/08/16 0654 08/09/16 1051  NA 142 141  K 3.6 3.6  CL 109 107  GLUCOSE 92 124*  BUN 23* 21*  CREATININE 0.87 1.00  CALCIUM 7.9* 8.4*   CBG (last 3)  No results for input(s): GLUCAP in the last 72 hours.  Wt Readings from Last 3 Encounters:  08/08/16 78.9 kg (174 lb)  07/27/16 78 kg (172 lb)  06/14/16 77.1 kg (170 lb)    Physical Exam:  Head: Normocephalicand atraumatic.  Eyes: EOMI  Neck: Normal range of motion. Neck supple. No JVDpresent. No tracheal deviationpresent. No thyromegalypresent.  Cardiovascular: RRR Respiratory: clear  GI: Soft. Bowel sounds are normal. He exhibits no distension. There is no tenderness. There is no reboundand no guarding.  Musculoskeletal: Edema: 1+ edema left thigh,lower extremity.  Neurological: He is alert. No cranial nerve deficit. Coordinationnormal.  Oriented to person, month, place, reason Follows simple commands. Motor 4/5 to 5/5 bilateral upper exts. LLE HF limited by pain--able to rotate the limb in bed, bends knee at least 3- to 3/5. ADF/PF 4/5. RLE 3/5 HF (continued pain inhibition), 4/5 KE and 5/5 ADF/PF--motor exam unchanged. No sensory deficits Skin: bruising on both thighs. Left incision dry/post-op dressing in place--stable Psych: anxious   Assessment/Plan: 1. FUnctional  deficits secondary to left femoral neck fracture, let subdural hygroma which require 3+ hours per day of interdisciplinary therapy in a comprehensive inpatient rehab setting. Physiatrist is providing close team supervision and 24 hour management of active medical problems listed below. Physiatrist and rehab team continue to assess barriers to discharge/monitor patient progress toward functional and medical goals.  Function:  Bathing Bathing position   Position: Shower  Bathing parts Body parts bathed by patient: Right arm, Left arm, Chest, Abdomen, Front perineal area, Buttocks, Right upper leg, Left upper leg, Right lower leg, Left lower leg Body parts bathed by helper: Back  Bathing assist Assist Level: Touching or steadying assistance(Pt > 75%)      Upper Body Dressing/Undressing Upper body dressing   What is the patient wearing?: Pull over shirt/dress     Pull over shirt/dress - Perfomed by patient: Thread/unthread right sleeve, Thread/unthread left sleeve, Put head through opening, Pull shirt over trunk          Upper body assist Assist Level: Set up      Lower Body Dressing/Undressing Lower body dressing   What is the patient wearing?: Underwear, Pants, Shoes Underwear - Performed by patient: Thread/unthread right underwear leg, Thread/unthread left underwear leg, Pull underwear up/down (with extra time and mod verbal cues)   Pants- Performed by patient: Thread/unthread right pants leg, Thread/unthread left pants leg, Pull pants up/down Pants- Performed by helper: Pull pants up/down         Shoes - Performed by patient: Don/doff right shoe, Don/doff left  shoe            Lower body assist Assist for lower body dressing: Touching or steadying assistance (Pt > 75%)      Toileting Toileting   Toileting steps completed by patient: Adjust clothing prior to toileting, Adjust clothing after toileting Toileting steps completed by helper: Performs perineal  hygiene Toileting Assistive Devices: Grab bar or rail, Toilet aid (urinal)  Toileting assist Assist level: Set up/obtain supplies, Supervision or verbal cues   Transfers Chair/bed transfer   Chair/bed transfer method: Stand pivot Chair/bed transfer assist level: Touching or steadying assistance (Pt > 75%) Chair/bed transfer assistive device: Armrests, Medical sales representative     Max distance: 8' Assist level: Touching or steadying assistance (Pt > 75%)   Wheelchair   Type: Manual Max wheelchair distance: 150 Assist Level: Supervision or verbal cues  Cognition Comprehension Comprehension assist level: Understands complex 90% of the time/cues 10% of the time  Expression Expression assist level: Expresses complex ideas: With extra time/assistive device  Social Interaction Social Interaction assist level: Interacts appropriately 90% of the time - Needs monitoring or encouragement for participation or interaction.  Problem Solving Problem solving assist level: Solves complex 90% of the time/cues < 10% of the time  Memory Memory assist level: Recognizes or recalls 75 - 89% of the time/requires cueing 10 - 24% of the time   Medical Problem List and Plan: 1. Decreased functional mobilitysecondary to left subdural hygroma andleft femoral neck fracture status post left hip replacement anterior approach [id number] complicated by findings of left frontal subdural hygroma. Weightbearing as tolerated -continue with CIR  -spoke with patient at length about rehab process. He and his wife are unable to manage at home as he is. He insists that he can hire help at home if needed.  2. DVT Prophylaxis/Anticoagulation: SCDs. Vascular study negative 3. Pain Management: Patient currently using only Tylenol for pain due to postoperative deliriumrelated to narcotics 4. Mood/delirium: Provide emotional support as appropriate 5. Neuropsych: This patient iscapable of making  decisions on hisown behalf. 6. Skin/Wound Care: Routine skin checks 7. Fluids/Electrolytes/Nutrition: folow up labs this week. -encourage PO.  -RD consult  -megace 8.Right thigh hematoma secondary to fall. Conservative care 9.Atrial fibrillation. Cardiac rate control. Follow-up per cardiology services. No current plan to resume Coumadin therapy. Lanoxin 0.125 mg daily, Cardizem CD 240 mg daily  -rate controlled 10.Hypertension. HCTZ 25 mg daily, lisinopril 10 mg daily.    Good control at present 11.Acute on chronic anemia. Follow-up CBC. Continue iron supplement 12.History of gout. Zyloprim 300 mg daily. Monitor for any signs of flareup 13.BPH. Proscar5 mg daily, Flomax 0.4 mg daily. Check PVR's 3 14.Hyperlipidemia.Lovaza1 mg daily 15.Constipation. Laxative assistance 16. Insomnia: low dose trazodone prn   LOS (Days) 2 A FACE TO FACE EVALUATION WAS PERFORMED  Alger Simons T, MD 08/10/2016 10:01 AM

## 2016-08-10 NOTE — Plan of Care (Signed)
Problem: RH BOWEL ELIMINATION Goal: RH STG MANAGE BOWEL WITH ASSISTANCE Pt will Manage Bowel function with moderate Assistance and no cues.   Outcome: Progressing Pt required education on reason for taking stool softeners based on receiving iron supplements.  After this, pt states he will be compliant in taking ordered stool softeners.

## 2016-08-10 NOTE — Progress Notes (Signed)
Physical Therapy Session Note  Patient Details  Name: ADALID HORRALL MRN: [id number] Date of Birth: [birth year]  Today's Date: 08/10/2016 PT Individual Time: 0900-1000 PT Individual Time Calculation (min): 60 min   Short Term Goals: Week 1:  PT Short Term Goal 1 (Week 1): =LTG due to estimated LOS  Skilled Therapeutic Interventions/Progress Updates: Pt received seated in bed, denies pain and agreeable to treatment. Reports not sleeping well at night. Supine>sit with S, bedrails and HOB elevated. Pt dons slippers with setupA. Sit >stand minA from low bed height. Gait x150' and x75' with min guard and RW on level surfaces. Note occasional staggering when ambulating, usually with LOB to L side and most commonly occurring with direction changes. Standing LE exercises in parallel bars; 2x15 each exercise including heel raises, toe raises, hip flexion marching, hamstring curl, mini-squats, hip abduction. Several rest breaks required d/t fatigue. Stand pivot transfer w/c <>mat table and w/c <>nustep with minA and RW. Seated LE strengthening exercises including hip flexion marching, long arc quad, hip adduction isometric. Provided pt with HEP handouts to perform exercises in room outside of regular therapy. Attempted performance of nustep; pt tolerates 2 min before reporting he needs to stop d/t knee pain. Pt states concern several times during session that his "fake knees" aren't able to tolerate the exercises; educated pt on importance of strengthening muscles around knee to provide stability and reduce pain, however pt continues to decline performance of nustep. Gait to return to room x160' with min guard and RW. Assisted pt with pulling down pants while seated in w/c to allow for placement or urinal. Remained seated in w/c with chair alarm intact and all needs in reach at end of session.      Therapy Documentation Precautions:  Precautions Precautions: Fall Precaution Comments: h/o recent falls.  No  history of falls prior to 07/24/16 Restrictions Weight Bearing Restrictions: No LLE Weight Bearing: Weight bearing as tolerated Other Position/Activity Restrictions: has R knee brace if needed   See Function Navigator for Current Functional Status.   Therapy/Group: Individual Therapy  Luberta Mutter 08/10/2016, 10:18 AM

## 2016-08-10 NOTE — Progress Notes (Signed)
Occupational Therapy Session Note  Patient Details  Name: Isaac Villa MRN: [id number] Date of Birth: 1925/05/11  Today's Date: 08/10/2016 OT Individual Time: 1330-1400 OT Individual Time Calculation (min): 30 min    Short Term Goals: Week 1:  OT Short Term Goal 1 (Week 1): STGs = LTGs  Skilled Therapeutic Interventions/Progress Updates: 1:1 Therapeutic activity with focus on short term recall and functional mobility with RW. Pt perform sit to stand from w/c with steadying A; ambulated to and stood at a table to read a directional question, then navigated to a wall to look at a map to answer the question and return to the table with questions. Pt able to recall question, navigate to find the answer on a map and then return to the next numbered question with min questioning cuing. Pt able to navigate in small space and make 90 degree turns with steady A with RW. returned to bed in room with steady A.      Therapy Documentation Precautions:  Precautions Precautions: Fall Precaution Comments: h/o recent falls.  No history of falls prior to 07/24/16 Restrictions Weight Bearing Restrictions: No LLE Weight Bearing: Weight bearing as tolerated Other Position/Activity Restrictions: has R knee brace if needed Pain:  no c/o pain in session   See Function Navigator for Current Functional Status.   Therapy/Group: Individual Therapy  Willeen Cass [facility] 08/10/2016, 2:58 PM

## 2016-08-10 NOTE — Plan of Care (Signed)
Problem: RH BLADDER ELIMINATION Goal: RH STG MANAGE BLADDER WITH ASSISTANCE STG Manage Bladder emptying With minimal Assistance by use of urinal, call light, sitter. Pt will anticipate toileting needs in a manageble time frame to avoid incontinence.   Outcome: Progressing Helper to set up urinal in pt's reach and assist pt to don least restrictive clothing at night in order for pt to be more independent in using urinal.

## 2016-08-10 NOTE — Care Management Note (Signed)
Inpatient [facility] Individual Statement of Services  Patient Name:  Isaac Villa  Date:  08/10/2016  Welcome to the [facility].  Our goal is to provide you with an individualized program based on your diagnosis and situation, designed to meet your specific needs.  With this comprehensive rehabilitation program, you will be expected to participate in at least 3 hours of rehabilitation therapies Monday-Friday, with modified therapy programming on the weekends.  Your rehabilitation program will include the following services:  Physical Therapy (PT), Occupational Therapy (OT), Speech Therapy (ST), 24 hour per day rehabilitation nursing, Therapeutic Recreaction (TR), Case Management (Social Worker), Rehabilitation Medicine, Nutrition Services and Pharmacy Services  Weekly team conferences will be held on Tuesdays to discuss your progress.  Your Social Worker will talk with you frequently to get your input and to update you on team discussions.  Team conferences with you and your family in attendance may also be held.  Expected length of stay: 5-7 days  Overall anticipated outcome: supervision  Depending on your progress and recovery, your program may change. Your Social Worker will coordinate services and will keep you informed of any changes. Your Social Worker's name and contact numbers are listed  below.  The following services may also be recommended but are not provided by the [facility] will be made to provide these services after discharge if needed.  Arrangements include referral to agencies that provide these services.  Your insurance has been verified to be:  Medicare and [place] Your primary doctor is:  Dr. Dorthy Cooler  Pertinent information will be shared with your doctor and your insurance company.  Social Worker:   [place], [place] or ([phone]   Information discussed with and copy given to patient by: Lennart Pall, 08/10/2016, 3:15 PM

## 2016-08-10 NOTE — Progress Notes (Signed)
Physical Therapy Session Note  Patient Details  Name: Isaac Villa MRN: [id number] Date of Birth: 1925-01-02  Today's Date: 08/10/2016 PT Individual Time: 1510-1620 PT Individual Time Calculation (min): 70 min   Short Term Goals: Week 1:  PT Short Term Goal 1 (Week 1): =LTG due to estimated LOS  Skilled Therapeutic Interventions/Progress Updates:   Pt received supine in bed and agreeable to PT. Supine>sit transfer with close supervision assist and min cues.    PT instructed patient Isaac Villa balance test; see below for results. Patient demonstrates increased fall risk as noted by score of  27 /56 on Berg Balance Scale.  (<36= high risk for falls, close to 100%; 37-45 significant >80%; 46-51 moderate >50%; 52-55 lower >25%)  PT also instructed patient in TUG; see below for results. (>15seconds indicates increased fall risk.   Variable gati training with weave through 6 cones x2 and stepping over 1 inch obstacles and up/down 6 inch curb with RW and min assist from PT and mod cues for step to gait pattern on curb and AD managemetn.   Gait to room with RW and min assist for safety x 159ft. Min cues for AD management and gait pattern in turns to prevent LOB.   Patient performed sit>supine with supervision assist from PT.   Left sitting in bed with bed alarm set and family present.       Therapy Documentation Precautions:  Precautions Precautions: Fall Precaution Comments: h/o recent falls.  No history of falls prior to 07/24/16 Restrictions Weight Bearing Restrictions: No LLE Weight Bearing: Weight bearing as tolerated Other Position/Activity Restrictions: has R knee brace if needed General:   Vital Signs: Therapy Vitals Temp: 98.4 F (36.9 C) Temp Source: Oral Pulse Rate: 94 Resp: 18 BP: (!) 125/54 Patient Position (if appropriate): Lying Oxygen Therapy SpO2: 93 % O2 Device: Not Delivered Pain:   0/10     Balance: Balance Balance Assessed: Yes Standardized Balance  Assessment Standardized Balance Assessment: Berg Balance Test;Timed Up and Go Test Berg Balance Test Sit to Stand: Able to stand  independently using hands Standing Unsupported: Able to stand 2 minutes with supervision Sitting with Back Unsupported but Feet Supported on Floor or Stool: Able to sit safely and securely 2 minutes Stand to Sit: Controls descent by using hands Transfers: Able to transfer with verbal cueing and /or supervision Standing Unsupported with Eyes Closed: Able to stand 10 seconds with supervision Standing Ubsupported with Feet Together: Needs help to attain position but able to stand for 30 seconds with feet together From Standing, Reach Forward with Outstretched Arm: Can reach forward >12 cm safely (5") From Standing Position, Pick up Object from Floor: Unable to try/needs assist to keep balance From Standing Position, Turn to Look Behind Over each Shoulder: Turn sideways only but maintains balance Turn 360 Degrees: Needs assistance while turning Standing Unsupported, Alternately Place Feet on Step/Stool: Able to complete >2 steps/needs minimal assist Standing Unsupported, One Foot in Front: Needs help to step but can hold 15 seconds Standing on One Leg: Tries to lift leg/unable to hold 3 seconds but remains standing independently Total Score: 27 Timed Up and Go Test TUG: Normal TUG Normal TUG (seconds): 27 (with RW)   See Function Navigator for Current Functional Status.   Therapy/Group: Individual Therapy  Lorie Phenix 08/10/2016, 5:32 PM

## 2016-08-10 NOTE — Progress Notes (Signed)
Occupational Therapy Session Note  Patient Details  Name: Isaac Villa MRN: [id number] Date of Birth: [birth year]  Today's Date: 08/10/2016 OT Individual Time: 1105-1208 OT Individual Time Calculation (min): 63 min    Short Term Goals: Week 1:  OT Short Term Goal 1 (Week 1): STGs = LTGs  Skilled Therapeutic Interventions/Progress Updates:    Pt seen for OT session focusing on functional transfers and LE strengthening/ activity tolerance. Pt in supine upon arrival, complaints of increased fatigue and requiring encouragement and education for participation. Reviewed purpose of CIR, OT and therapy goals. Pt willing to participate as able. Upon transfer to EOB pt with complaints of dizziness, BP 112/62. Following rest, pt voiced readiness for mobility.  He ambulated throughout session with supervision using RW. In ADL apartment, completed simulated shower stall transfer to [facility] with steadying assist following demonstration for proper technique. Discussed various shower seat options. Education also provided to pt's daughter and wife at end of session regarding DME and recommendations. Pt's family to bring in photos of home bathroom for better recommendation to be made.  In therapy gym, pt sat EOM to complete toe taps on cones x2 sets of 10 on R/L. Then completed in standing at [facility] with close supervision. Pt ambulated back to room at end of session, declining to stay up in chair for lunch. Upon exiting room pt already asleep. Time spent with pt's wife and daughter regarding the above DME issues, pt's current level of function both physically and cognitivaely, role of OT,PT, and SLP, continuum of care, and d/c planning.   Therapy Documentation Precautions:  Precautions Precautions: Fall Precaution Comments: h/o recent falls.  No history of falls prior to 07/24/16 Restrictions Weight Bearing Restrictions: No LLE Weight Bearing: Weight bearing as tolerated Other Position/Activity Restrictions: has R  knee brace if needed Pain:   No/ denies pain ADL: ADL ADL Comments: refer to functional navigator  See Function Navigator for Current Functional Status.   Therapy/Group: Individual Therapy  [name], Kalliope Riesen C 08/10/2016, 6:59 AM

## 2016-08-10 NOTE — Progress Notes (Signed)
Speech Language Pathology Daily Session Note  Patient Details  Name: Isaac Villa MRN: [id number] Date of Birth: 09-25-1924  Today's Date: 08/10/2016 SLP Individual Time: 1015-1100 SLP Individual Time Calculation (min): 45 min  Short Term Goals: Week 1: SLP Short Term Goal 1 (Week 1): short term goals = long term goals due to length of stay   Skilled Therapeutic Interventions: Skilled treatment session focus cognition goals. SLP facilitated session by providing Mod I for completion of complex problem solving tasks which included calendar/daily schedule organization. Pt was returned to room, left upright in wheelchair, chair alarm on and all needs within reach. Continue current plan of care.      Function:  Cognition Comprehension Comprehension assist level: Understands complex 90% of the time/cues 10% of the time  Expression   Expression assist level: Expresses complex ideas: With extra time/assistive device  Social Interaction Social Interaction assist level: Interacts appropriately 90% of the time - Needs monitoring or encouragement for participation or interaction.  Problem Solving Problem solving assist level: Solves complex 90% of the time/cues < 10% of the time  Memory Memory assist level: Recognizes or recalls 75 - 89% of the time/requires cueing 10 - 24% of the time    Pain    Therapy/Group: Individual Therapy  Isaac Villa, M.S., CCC-SLP Speech-Language Pathologist  Beatrice Sehgal 08/10/2016, 12:48 PM

## 2016-08-11 ENCOUNTER — Inpatient Hospital Stay (HOSPITAL_COMMUNITY): Payer: Medicare Other | Admitting: Speech Pathology

## 2016-08-11 ENCOUNTER — Inpatient Hospital Stay (HOSPITAL_COMMUNITY): Payer: Medicare Other | Admitting: Physical Therapy

## 2016-08-11 ENCOUNTER — Inpatient Hospital Stay (HOSPITAL_COMMUNITY): Payer: Medicare Other | Admitting: Occupational Therapy

## 2016-08-11 DIAGNOSIS — R638 Other symptoms and signs concerning food and fluid intake: Secondary | ICD-10-CM

## 2016-08-11 DIAGNOSIS — I482 Chronic atrial fibrillation: Secondary | ICD-10-CM

## 2016-08-11 NOTE — IPOC Note (Signed)
Overall Plan of Care [facility]) Patient Details Name: ZEBULAN MICHELENA MRN: [id number] DOB: 08-Nov-1924  Admitting Diagnosis: L  FN  Hospital Problems: Active Problems:   Left displaced femoral neck fracture ()     Functional Problem List: Nursing Medication Management, Pain, Safety, Skin Integrity, Endurance  PT Balance, Endurance, Motor, Pain, Safety, Skin Integrity  OT Balance, Cognition, Endurance, Motor, Pain  SLP Cognition  TR         Basic ADL's: OT Bathing, Dressing, Toileting     Advanced  ADL's: OT       Transfers: PT Bed Mobility, Bed to Chair, Car, Manufacturing systems engineer, Metallurgist: PT Ambulation, Emergency planning/management officer, Stairs     Additional Impairments: OT None  SLP Social Cognition   Problem Solving, Memory  TR      Anticipated Outcomes Item Anticipated Outcome  Self Feeding I  Swallowing      Basic self-care  supervision   Toileting  mod I   Bathroom Transfers supervision to shower, mod I to toilet  Bowel/Bladder  Supervision  Transfers  modI  Locomotion  S with LRAD   Communication     Cognition  Supervision with complex   Pain  <4 on a 0-10 pain with appropriate judgement to call for assistance  Safety/Judgment  mod-max assist with appropriate equipment   Therapy Plan: PT Intensity: Minimum of 1-2 x/day ,45 to 90 minutes PT Frequency: 5 out of 7 days PT Duration Estimated Length of Stay: 7-10 days OT Intensity: Minimum of 1-2 x/day, 45 to 90 minutes OT Frequency: 5 out of 7 days OT Duration/Estimated Length of Stay: 7-8 days SLP Intensity: Minumum of 1-2 x/day, 30 to 90 minutes SLP Frequency: 3 to 5 out of 7 days SLP Duration/Estimated Length of Stay: ~7 days        Team Interventions: Nursing Interventions Patient/Family Education, Pain Management, Medication Management, Skin Care/Wound Management, Cognitive Remediation/Compensation, Discharge Planning, Disease Management/Prevention  PT interventions  Balance/vestibular training, Ambulation/gait training, Therapeutic Exercise, Therapeutic Activities, Stair training, UE/LE Coordination activities, UE/LE Strength taining/ROM, Wheelchair propulsion/positioning, Neuromuscular re-education, DME/adaptive equipment instruction, Community reintegration, Discharge planning, Pain management, Skin care/wound management, Patient/family education, Functional mobility training, Disease management/prevention, Cognitive remediation/compensation  OT Interventions Training and development officer, Cognitive remediation/compensation, Discharge planning, DME/adaptive equipment instruction, Functional mobility training, Pain management, Patient/family education, Self Care/advanced ADL retraining, Psychosocial support, Therapeutic Activities, Therapeutic Exercise  SLP Interventions Cognitive remediation/compensation, Cueing hierarchy, Functional tasks, Medication managment, Internal/external aids, Patient/family education  TR Interventions    SW/CM Interventions Discharge Planning, Psychosocial Support, Patient/Family Education    Team Discharge Planning: Destination: PT-Home ,OT- Home , SLP-Home Projected Follow-up: PT-Outpatient PT, OT-  Home health OT, SLP-None Projected Equipment Needs: PT-None recommended by PT, OT- Tub/shower seat, SLP-None recommended by SLP Equipment Details: PT-has RW, OT-  Patient/family involved in discharge planning: PT- Patient,  OT-Patient, Family member/caregiver, SLP-Patient, Family member/caregiver  MD ELOS: 7 days Medical Rehab Prognosis:  Excellent Assessment: The patient has been admitted for CIR therapies with the diagnosis of left femoral neck fx s/p THA. The team will be addressing functional mobility, strength, stamina, balance, safety, adaptive techniques and equipment, self-care, bowel and bladder mgt, patient and caregiver education, ortho precautions, pain mgt, ego support, community reintegration. Goals have been set at mod I to  supervision for all self-care and mobility tasks. Cognitively pt is showing nice improvement but is frustrated by being in the hospital. Family quite supportive   Meredith Staggers, MD, [facility]  See Team Conference Notes for weekly updates to the plan of care

## 2016-08-11 NOTE — Progress Notes (Signed)
Physical Therapy Session Note  Patient Details  Name: Isaac Villa MRN: [id number] Date of Birth: 14-May-1925  Today's Date: 08/11/2016 PT Individual Time: 0800-0900 and 1405-1500 PT Individual Time Calculation (min): 60 min and 55 min (total 115 min)   Short Term Goals: Week 1:  PT Short Term Goal 1 (Week 1): =LTG due to estimated LOS  Skilled Therapeutic Interventions/Progress Updates: Tx 1: Pt received supine in bed, denies pain and agreeable to treatment. Supine>sit with modI, HOB elevated and bedrails. Dons shoes on EOB with setupA. Gait in/out of bathroom with RW and S. Performed clothing management with S. Gait to gym with RW and S x175'. Performed ascent/descent of four 6" stairs with B handrails and min guard faded to S; 3 trials for a total of 12 stairs however required seated rest breaks between d/t R knee pain and fatigue. Sit >supine on mat with S. Bridging 3x15 reps, straight leg raise 2x10 (AAROM LLE d/t pain), hip abduction in hook lying with light orange theraband 3x15. Gait in hallway with 1 handrail, forward/backward/side stepping for LE coordination for stepping strategies and strengthening of hip musculature. Gait over uneven surface with RW and min guard. Gait to return to room x175' with RW and S, min cues for upright posture. Remained supine in bed at end of session, alarm intact and all needs in reach.   Tx 2: Pt received supine in bed, denies pain and agreeable to treatment. Pt does report significant fatigue. Supine>sit with S and increased time. Dons shoes on EOB with setupA and increased time. Gait to gym with RW and S x175'. Performed biodex catch game on static surface with BUE and one UE support, reduced support to increase challenge. Pt declines performance without UE support. Standing balance activities including static stance without AD, forward backward steps with pt very fearful of weight shifting and single limb stance for stepping/tapping feet. Performed static  stance with dynamic UE reaching while retrieving and matching cards to board; min guard for balance with several minor LOBs posteriorly, requiring minA to correct. Activity repeated standing on airex pad and one UE support on RW. Static stance on foam wedge for anterior tib facilitation while engaged in [employer]. Pt very fatigued after standing balance activities and requires several minute rest break. Gait to return to room with RW and S. Remained supine at end of session, all needs in reach.      Therapy Documentation Precautions:  Precautions Precautions: Fall Precaution Comments: h/o recent falls.  No history of falls prior to 07/24/16 Restrictions Weight Bearing Restrictions: No LLE Weight Bearing: Weight bearing as tolerated Other Position/Activity Restrictions: has R knee brace if needed   See Function Navigator for Current Functional Status.   Therapy/Group: Individual Therapy  Luberta Mutter 08/11/2016, 8:52 AM

## 2016-08-11 NOTE — Progress Notes (Signed)
Occupational Therapy Session Note  Patient Details  Name: Isaac Villa MRN: [id number] Date of Birth: Feb 03, 1925  Today's Date: 08/11/2016 OT Individual Time: 1104-1200 OT Individual Time Calculation (min): 56 min    Short Term Goals: Week 1:  OT Short Term Goal 1 (Week 1): STGs = LTGs  Skilled Therapeutic Interventions/Progress Updates:    Pt seen for OT ADL bathing/dressing session. Pt in supine upon arrival, required encouragement and extra time and education regarding CIR and importance of OOB/ mobility.  He ambulated throughout session using RW with supervision. Requires VCs for hand placement during transfers and RW management during functional tasks. Pt showered seated on 3-1 BSC, lateral leans to complete buttock hygiene. He dressed seated in w/c, VCs for independence with task as pt asking wife to assist with donning shirt. He did require assist with socks, however, able to complete remainder of dressing task with supervision. He required encouragement to cont with therapy following shower, agreeable to stay in room and complete UE strengthening exercises. Completed theraband exercises using level 1 (yellow) band. Completed x1 set of 10 reps chest expansion, diagonal up, and diagonal down. Will cont to help establish HEP and pt would benefit from handout of exercises. Despite encouragement to stay up in chair, pt requested return to supine. Left in supine with all needs in reach, bed alarm on, and bed rails up per pt's family request.  Pt's wife provided pictures of home bathroom. Recommendations made for hand held shower head and placement of grab bars.  Will provide handout for shower seat recommendation.   Therapy Documentation Precautions:  Precautions Precautions: Fall Precaution Comments: h/o recent falls.  No history of falls prior to 07/24/16 Restrictions Weight Bearing Restrictions: No LLE Weight Bearing: Weight bearing as tolerated Other Position/Activity Restrictions:  has R knee brace if needed Pain:   No/ denies pain ADL: ADL ADL Comments: refer to functional navigator  See Function Navigator for Current Functional Status.   Therapy/Group: Individual Therapy  [name], [name] 08/11/2016, 7:16 AM

## 2016-08-11 NOTE — Progress Notes (Signed)
Physical Therapy Session Note  Patient Details  Name: Isaac Villa MRN: [id number] Date of Birth: October 17, 1924  Today's Date: 08/11/2016 PT Individual Time: [id number] PT Individual Time Calculation (min): 25 min   Short Term Goals: Week 1:  PT Short Term Goal 1 (Week 1): =LTG due to estimated LOS  Skilled Therapeutic Interventions/Progress Updates:  Pt received supine in bed and agreeable to PT. Supine>sit transfer with supervision assist, heavy use of  Bed rails and min cues. Patient reports that he is fine with walking, but is not going to do "all that other stuff" due to fatigue and knee pain.    PT instructed pt in Gait around unit with RW and supervision assist with min cues for posture 189ft and 154ft with one therapeutic rest break. Pt required to use restroom following gait. Toilet transfer with supervision assist from PT for safety and cues for UE placement. Bed mobility to return to supine position at end of treatment with supervision assist. Pt completed sit<>stand transfers with sueprvision assist from various surface heights x 5 throughout treatment.  Pt left supine in bed with call bell in reach and bed alarm activated.      Therapy Documentation Precautions:  Precautions Precautions: Fall Precaution Comments: h/o recent falls.  No history of falls prior to 07/24/16 Restrictions Weight Bearing Restrictions: No LLE Weight Bearing: Weight bearing as tolerated Other Position/Activity Restrictions: has R knee brace if needed General: PT Amount of Missed Time (min): 20 Minutes PT Missed Treatment Reason: Patient fatigue Pain: 2/10 in Bilateral knees.    See Function Navigator for Current Functional Status.   Therapy/Group: Individual Therapy  Lorie Phenix 08/11/2016, 5:35 PM

## 2016-08-11 NOTE — Progress Notes (Signed)
Speech Language Pathology Daily Session Note  Patient Details  Name: Isaac Villa MRN: [id number] Date of Birth: 12-26-1924  Today's Date: 08/11/2016 SLP Individual Time: 1300-1345 SLP Individual Time Calculation (min): 45 min  Short Term Goals: Week 1: SLP Short Term Goal 1 (Week 1): short term goals = long term goals due to length of stay   Skilled Therapeutic Interventions: Skilled treatment session focused on cognition goals. SLP facilitated session by providing Mod I when completing complex problems. Pt able to self-monitor and correct errors as they occurred. Pt able to state that he shouldn't get up in room but then attempts to get up without help. Education provided to pt on need for nursing supervision. Pt left in bed with bed alarm on and all needs within reach.      Function:  Eating Eating   Modified Consistency Diet: No Eating Assist Level: Set up assist for   Eating Set Up Assist For: Opening containers       Cognition Comprehension Comprehension assist level: Follows complex conversation/direction with no assist  Expression   Expression assist level: Expresses complex ideas: With extra time/assistive device  Social Interaction Social Interaction assist level: Interacts appropriately 90% of the time - Needs monitoring or encouragement for participation or interaction.  Problem Solving Problem solving assist level: Solves complex problems: With extra time  Memory Memory assist level: Recognizes or recalls 90% of the time/requires cueing < 10% of the time    Pain    Therapy/Group: Individual Therapy   Kiley Torrence B. Rutherford Nail, M.S., CCC-SLP Speech-Language Pathologist  Kordelia Severin 08/11/2016, 1:28 PM

## 2016-08-11 NOTE — Progress Notes (Signed)
Isaac Villa PHYSICAL MEDICINE & REHABILITATION     PROGRESS NOTE    Subjective/Complaints: Up with PT walking. Happy with projected discharge date. Having a good morning but still somewhat "stir crazy"  ROS: pt denies nausea, vomiting, diarrhea, cough, shortness of breath or chest pain     Objective: Vital Signs: Blood pressure (!) 131/52, pulse 64, temperature 97.4 F (36.3 C), temperature source Oral, resp. rate 18, height 6\' 2"  (1.88 m), weight 78.9 kg (174 lb), SpO2 99 %. No results found.  Recent Labs  08/09/16 1051  WBC 14.9*  HGB 10.7*  HCT 34.0*  PLT 278    Recent Labs  08/09/16 1051  NA 141  K 3.6  CL 107  GLUCOSE 124*  BUN 21*  CREATININE 1.00  CALCIUM 8.4*   CBG (last 3)  No results for input(s): GLUCAP in the last 72 hours.  Wt Readings from Last 3 Encounters:  08/08/16 78.9 kg (174 lb)  07/27/16 78 kg (172 lb)  06/14/16 77.1 kg (170 lb)    Physical Exam:  Head: Normocephalicand atraumatic.  Eyes: EOMI  Neck: Normal range of motion. Neck supple. No JVDpresent. No tracheal deviationpresent. No thyromegalypresent.  Cardiovascular: RRR Respiratory: CTA B  GI: Soft. Bowel sounds are normal. He exhibits no distension. There is no tenderness. There is no reboundand no guarding.  Musculoskeletal: Edema: 1+ LLE.  Neurological: He is alert. No cranial nerve deficit. Coordinationnormal.  Oriented to person, month, place, reason Follows simple commands. Motor 4/5 to 5/5 bilateral upper exts. LLE HF limited by pain--able to rotate the limb in bed, bends knee at least 3- to 3/5. ADF/PF 4/5. RLE 3/5 HF--moves well when upright, 4/5 KE and 5/5 ADF/PF--motor exam stable. No sensory deficits Skin: bruising on both thighs. Left incision clean/post-op dressing in place Psych: pleasant and cooperative   Assessment/Plan: 1. FUnctional deficits secondary to left femoral neck fracture, let subdural hygroma which require 3+ hours per day of interdisciplinary  therapy in a comprehensive inpatient rehab setting. Physiatrist is providing close team supervision and 24 hour management of active medical problems listed below. Physiatrist and rehab team continue to assess barriers to discharge/monitor patient progress toward functional and medical goals.  Function:  Bathing Bathing position   Position: Shower  Bathing parts Body parts bathed by patient: Right arm, Left arm, Chest, Abdomen, Front perineal area, Buttocks, Right upper leg, Left upper leg, Right lower leg, Left lower leg Body parts bathed by helper: Back  Bathing assist Assist Level: Touching or steadying assistance(Pt > 75%)      Upper Body Dressing/Undressing Upper body dressing   What is the patient wearing?: Pull over shirt/dress     Pull over shirt/dress - Perfomed by patient: Thread/unthread right sleeve, Thread/unthread left sleeve, Put head through opening, Pull shirt over trunk          Upper body assist Assist Level: Set up      Lower Body Dressing/Undressing Lower body dressing   What is the patient wearing?: Underwear, Pants, Shoes Underwear - Performed by patient: Thread/unthread right underwear leg, Thread/unthread left underwear leg, Pull underwear up/down (with extra time and mod verbal cues)   Pants- Performed by patient: Thread/unthread right pants leg, Thread/unthread left pants leg, Pull pants up/down Pants- Performed by helper: Pull pants up/down         Shoes - Performed by patient: Don/doff right shoe, Don/doff left shoe            Lower body assist Assist for lower  body dressing: Touching or steadying assistance (Pt > 75%)      Toileting Toileting   Toileting steps completed by patient: Adjust clothing prior to toileting, Adjust clothing after toileting, Performs perineal hygiene Toileting steps completed by helper: Performs perineal hygiene Toileting Assistive Devices: Grab bar or rail  Toileting assist Assist level: Supervision or verbal  cues   Transfers Chair/bed transfer   Chair/bed transfer method: Stand pivot Chair/bed transfer assist level: Touching or steadying assistance (Pt > 75%) Chair/bed transfer assistive device: Armrests, Medical sales representative     Max distance: 175 Assist level: Supervision or verbal cues   Wheelchair   Type: Manual Max wheelchair distance: 150 Assist Level: Supervision or verbal cues  Cognition Comprehension Comprehension assist level: Understands complex 90% of the time/cues 10% of the time  Expression Expression assist level: Expresses complex ideas: With extra time/assistive device  Social Interaction Social Interaction assist level: Interacts appropriately 90% of the time - Needs monitoring or encouragement for participation or interaction.  Problem Solving Problem solving assist level: Solves complex 90% of the time/cues < 10% of the time  Memory Memory assist level: Recognizes or recalls 75 - 89% of the time/requires cueing 10 - 24% of the time   Medical Problem List and Plan: 1. Decreased functional mobilitysecondary to left subdural hygroma andleft femoral neck fracture status post left hip replacement anterior approach [id number] complicated by findings of left frontal subdural hygroma. Weightbearing as tolerated -continue with CIR  -team working toward Tuesday discharge. Pt is pleased with this  2. DVT Prophylaxis/Anticoagulation: SCDs. Vascular study negative 3. Pain Management: Patient currently using only Tylenol for pain due to postoperative deliriumrelated to narcotics 4. Mood/delirium: Provide emotional support as appropriate 5. Neuropsych: This patient iscapable of making decisions on hisown behalf. 6. Skin/Wound Care: Routine skin checks 7. Fluids/Electrolytes/Nutrition: folow up labs this week. -encourage PO.  -RD consult  -megace initiated. Still eating 5-10% meals 8.Right thigh hematoma secondary to fall. Conservative  care 9.Atrial fibrillation. Cardiac rate control. Follow-up per cardiology services. No current plan to resume Coumadin therapy. Lanoxin 0.125 mg daily, Cardizem CD 240 mg daily  -rate controlled 10.Hypertension. HCTZ 25 mg daily, lisinopril 10 mg daily.    Good control at present 11.Acute on chronic anemia. Follow-up CBC. Continue iron supplement 12.History of gout. Zyloprim 300 mg daily. Monitor for any signs of flareup 13.BPH. Proscar5 mg daily, Flomax 0.4 mg daily. Emptying without issues 14.Hyperlipidemia.Lovaza1 mg daily 15.Constipation. Had bm on 2/7 16. Insomnia: low dose trazodone prn   LOS (Days) 3 A FACE TO FACE EVALUATION WAS PERFORMED  Alger Simons T, MD 08/11/2016 10:20 AM

## 2016-08-11 NOTE — Plan of Care (Signed)
Problem: RH BLADDER ELIMINATION Goal: RH STG MANAGE BLADDER WITH ASSISTANCE STG Manage Bladder emptying With minimal Assistance by use of urinal, call light, sitter. Pt will anticipate toileting needs in a manageble time frame to avoid incontinence.   Outcome: Progressing Goal is more independence in managing bladder/voids without incontinence or spills. Methods include use of least restrictive clothing to manage handling urinal, keeping urinal within easy reach, prompting.

## 2016-08-11 NOTE — Plan of Care (Signed)
Problem: RH BOWEL ELIMINATION Goal: RH STG MANAGE BOWEL W/MEDICATION W/ASSISTANCE Pt will Manage Bowel patterns with Medication with minimal Assistance and will proactively ask for medication to maintain a regular bowel pattern.   Outcome: Progressing Pt will be assisted and educated to monitor his bowel habits with increasing independence and need for medication assistance.

## 2016-08-12 ENCOUNTER — Inpatient Hospital Stay (HOSPITAL_COMMUNITY): Payer: Medicare Other | Admitting: Occupational Therapy

## 2016-08-12 ENCOUNTER — Inpatient Hospital Stay (HOSPITAL_COMMUNITY): Payer: Medicare Other | Admitting: Physical Therapy

## 2016-08-12 ENCOUNTER — Inpatient Hospital Stay (HOSPITAL_COMMUNITY): Payer: Medicare Other | Admitting: Speech Pathology

## 2016-08-12 DIAGNOSIS — D62 Acute posthemorrhagic anemia: Secondary | ICD-10-CM

## 2016-08-12 DIAGNOSIS — E8809 Other disorders of plasma-protein metabolism, not elsewhere classified: Secondary | ICD-10-CM

## 2016-08-12 DIAGNOSIS — D72829 Elevated white blood cell count, unspecified: Secondary | ICD-10-CM

## 2016-08-12 DIAGNOSIS — D181 Lymphangioma, any site: Secondary | ICD-10-CM

## 2016-08-12 DIAGNOSIS — I10 Essential (primary) hypertension: Secondary | ICD-10-CM

## 2016-08-12 DIAGNOSIS — D638 Anemia in other chronic diseases classified elsewhere: Secondary | ICD-10-CM

## 2016-08-12 DIAGNOSIS — E46 Unspecified protein-calorie malnutrition: Secondary | ICD-10-CM

## 2016-08-12 DIAGNOSIS — I480 Paroxysmal atrial fibrillation: Secondary | ICD-10-CM

## 2016-08-12 NOTE — Progress Notes (Signed)
[place] PHYSICAL MEDICINE & REHABILITATION     PROGRESS NOTE    Subjective/Complaints: Pt seen laying in bed this AM.  He slept well overnight.  He states he would like to get up and get ready for the day.  Family at bedside.   ROS: Denies nausea, vomiting, diarrhea, shortness of breath or chest pain  Objective: Vital Signs: Blood pressure (!) 120/51, pulse 72, temperature 98.5 F (36.9 C), temperature source Oral, resp. rate 18, height 6\' 2"  (1.88 m), weight 78.9 kg (174 lb), SpO2 90 %. No results found. No results for input(s): WBC, HGB, HCT, PLT in the last 72 hours. No results for input(s): NA, K, CL, GLUCOSE, BUN, CREATININE, CALCIUM in the last 72 hours.  Invalid input(s): CO CBG (last 3)  No results for input(s): GLUCAP in the last 72 hours.  Wt Readings from Last 3 Encounters:  08/08/16 78.9 kg (174 lb)  07/27/16 78 kg (172 lb)  06/14/16 77.1 kg (170 lb)    Physical Exam:  Head: Normocephalicand atraumatic.  Eyes: EOMI. No discharge.  Cardiovascular: RRR. No JVD. Respiratory: CTA B. Unlabored.  GI: Soft. Bowel sounds are normal.  Musculoskeletal: Edema: 1+ LLE. No tenderness Neurological: He is alert and oriented.  Follows commands.  Motor 5/5 bilateral upper exts.  LLE HF 4-/5, ADF/PF 4/5.  RLE 4/5 HF, 4/5 KE and 5/5 ADF/PF Skin: bruising on both thighs. Left incision with dressing c/d/i Psych: pleasant and cooperative.  Assessment/Plan: 1. FUnctional deficits secondary to left femoral neck fracture, let subdural hygroma which require 3+ hours per day of interdisciplinary therapy in a comprehensive inpatient rehab setting. Physiatrist is providing close team supervision and 24 hour management of active medical problems listed below. Physiatrist and rehab team continue to assess barriers to discharge/monitor patient progress toward functional and medical goals.  Function:  Bathing Bathing position   Position: Shower  Bathing parts Body parts bathed  by patient: Right arm, Left arm, Chest, Abdomen, Front perineal area, Buttocks, Right upper leg, Left upper leg, Right lower leg, Left lower leg Body parts bathed by helper: Back  Bathing assist Assist Level: Supervision or verbal cues      Upper Body Dressing/Undressing Upper body dressing   What is the patient wearing?: Pull over shirt/dress     Pull over shirt/dress - Perfomed by patient: Thread/unthread right sleeve, Thread/unthread left sleeve, Put head through opening, Pull shirt over trunk          Upper body assist Assist Level: Set up   Set up : To obtain clothing/put away  Lower Body Dressing/Undressing Lower body dressing   What is the patient wearing?: Underwear, Pants, Socks Underwear - Performed by patient: Thread/unthread right underwear leg, Thread/unthread left underwear leg, Pull underwear up/down   Pants- Performed by patient: Thread/unthread right pants leg, Thread/unthread left pants leg, Pull pants up/down Pants- Performed by helper: Pull pants up/down     Socks - Performed by patient: Don/doff right sock, Don/doff left sock   Shoes - Performed by patient: Don/doff right shoe, Don/doff left shoe            Lower body assist Assist for lower body dressing: Supervision or verbal cues      Toileting Toileting   Toileting steps completed by patient: Adjust clothing prior to toileting, Adjust clothing after toileting, Performs perineal hygiene Toileting steps completed by helper: Adjust clothing prior to toileting, Performs perineal hygiene, Adjust clothing after toileting Toileting Assistive Devices: Grab bar or rail  Toileting assist  Assist level: Supervision or verbal cues   Transfers Chair/bed transfer   Chair/bed transfer method: Stand pivot Chair/bed transfer assist level: Touching or steadying assistance (Pt > 75%) Chair/bed transfer assistive device: Armrests, Medical sales representative     Max distance: 175 Assist level:  Supervision or verbal cues   Wheelchair   Type: Manual Max wheelchair distance: 150 Assist Level: Supervision or verbal cues  Cognition Comprehension Comprehension assist level: Follows complex conversation/direction with no assist  Expression Expression assist level: Expresses complex ideas: With extra time/assistive device  Social Interaction Social Interaction assist level: Interacts appropriately 90% of the time - Needs monitoring or encouragement for participation or interaction.  Problem Solving Problem solving assist level: Solves complex problems: With extra time  Memory Memory assist level: Recognizes or recalls 90% of the time/requires cueing < 10% of the time   Medical Problem List and Plan: 1. Decreased functional mobilitysecondary to left subdural hygroma andleft femoral neck fracture status post left hip replacement anterior approach 123XX123 complicated by findings of left frontal subdural hygroma. Weightbearing as tolerated  -Records reviewed. MRI 2/4 reviewed, showing likely hygroma with mild mass effect, will discuss with primary teams future plans for repeat imaging -continue with CIR  -team working toward Tuesday discharge. Pt is pleased with this  2. DVT Prophylaxis/Anticoagulation: SCDs. Vascular study negative 3. Pain Management: Patient currently using only Tylenol for pain due to postoperative deliriumrelated to narcotics 4. Mood/delirium: Provide emotional support as appropriate 5. Neuropsych: This patient iscapable of making decisions on hisown behalf. 6. Skin/Wound Care: Routine skin checks 7. Fluids/Electrolytes/Nutrition: . -encourage PO.  -RD consult  -megace initiated.  8.Right thigh hematoma secondary to fall. Conservative care 9.Atrial fibrillation. Cardiac rate control. Follow-up per cardiology services. No current plan to resume Coumadin therapy. Lanoxin 0.125 mg daily, Cardizem CD 240 mg daily 10.Hypertension. HCTZ  25 mg daily, lisinopril 10 mg daily.    Controlled 2/10 11.Acute on chronic anemia. Continue iron supplement  Hb 10.7 on 2/7, labile recordings ?accuracy  Repeat labs ordered for Monday 12.History of gout. Zyloprim 300 mg daily. Monitor for any signs of flareup 13.BPH. Proscar5 mg daily, Flomax 0.4 mg daily. Emptying without issues 14.Hyperlipidemia.Lovaza1 mg daily 15.Constipation. Improving 16. Insomnia: low dose trazodone prn 17. Leukocytosis  Afebrile  Labile as well, labs ordered for Monday 18. Hypoalbuminemia  Supplement 19. Hypophosphatemia  On supplementation  Labs ordered for Monday   LOS (Days) 4 A FACE TO FACE EVALUATION WAS PERFORMED  Ankit Lorie Phenix, MD 08/12/2016 11:48 AM

## 2016-08-12 NOTE — Progress Notes (Signed)
Patient refused some of his evening meds, colace 100mg , Prostat liquid, ferrous sufate 325mg , and trazadone 25mg . Patient sitter in room expressed that patient didn't really sleep good last evening. I encouraged patient the indication for trazadone, but patient refused and requested tylenol instead. Cont plan of care. Abeer Iversen, Dione Plover

## 2016-08-12 NOTE — Progress Notes (Signed)
Speech Language Pathology Daily Session Note  Patient Details  Name: Isaac Villa MRN: [id number] Date of Birth: [birth year]  Today's Date: 08/12/2016 SLP Individual Time: 1115-1200 SLP Individual Time Calculation (min): 45 min  Short Term Goals: Week 1: SLP Short Term Goal 1 (Week 1): short term goals = long term goals due to length of stay   Skilled Therapeutic Interventions: Skilled treatment session focused on cognition goals. SLP facilitated session by providing Min A to supervision cues for complex medication management task. Pt preoccupied with actual medicines on list despite education that some medicines he may no longer be taking. Pt able to sustain attention to task in mildly distracting environment with Mod I. Pt was returned to room, left upright in wheelchair with wife. Continue per current plan of care.      Function:    Cognition Comprehension Comprehension assist level: Follows complex conversation/direction with no assist  Expression   Expression assist level: Expresses complex ideas: With extra time/assistive device  Social Interaction Social Interaction assist level: Interacts appropriately 90% of the time - Needs monitoring or encouragement for participation or interaction.  Problem Solving Problem solving assist level: Solves complex problems: With extra time  Memory Memory assist level: Recognizes or recalls 90% of the time/requires cueing < 10% of the time    Pain Pain Assessment Pain Assessment: 0-10 Pain Score: 2  Pain Type: Acute pain Pain Location: Knee Pain Orientation: Right Pain Descriptors / Indicators: Aching Pain Frequency: Constant Pain Onset: On-going Patients Stated Pain Goal: 0 Pain Intervention(s): Medication (See eMAR) Multiple Pain Sites: No  Therapy/Group: Individual Therapy   Isaac Villa B. Rutherford Nail, M.S., CCC-SLP Speech-Language Pathologist   Isaac Villa 08/12/2016, 11:39 AM

## 2016-08-12 NOTE — Progress Notes (Signed)
Physical Therapy Session Note  Patient Details  Name: Isaac Villa MRN: [id number] Date of Birth: 17-Dec-1924  Today's Date: 08/12/2016 PT Individual Time: 1435-1521 PT Individual Time Calculation (min): 46 min   Short Term Goals: Week 1:  PT Short Term Goal 1 (Week 1): =LTG due to estimated LOS  Skilled Therapeutic Interventions/Progress Updates:   Pt received supine in gym after OT session; pt reporting fatigue but willing to participate.  Pt performed supine and standing LE strengthening and balance exercises; see below.  During standing balance exercises pt stated, "I'm done".  Pt returned to w/c and returned to room.  Pt requesting to return to bed to rest; performed stand pivot w/c > bed with supervision-min A and RW and performed sit > supine with supervision.  Pt left in bed with wife present and all items within reach at end of session.  Therapy Documentation Precautions:  Precautions Precautions: Fall Precaution Comments: h/o recent falls.  No history of falls prior to 07/24/16 Restrictions Weight Bearing Restrictions: No LLE Weight Bearing: Weight bearing as tolerated Other Position/Activity Restrictions: has R knee brace if needed General: PT Amount of Missed Time (min): 14 Minutes PT Missed Treatment Reason: Patient fatigue (back to back therapy sessions) Pain: Pain Assessment Pain Assessment: 0-10 Pain Score: 6  Pain Type: Acute pain Pain Location: Knee Pain Orientation: Right Pain Descriptors / Indicators: Aching Pain Onset: On-going Pain Intervention(s): Rest;Repositioned Exercises: Trunk/Core Exercises Crunches:  (reverse-double knee to chest) General Exercises - Lower Extremity Gluteal Sets: Strengthening;Right;Left;10 reps;Supine;Other (comment) (bridges on mat) Heel Slides: Strengthening;Right;Left;10 reps;Supine Hip ABduction/ADduction: Strengthening;Right;Left;10 reps;Supine Straight Leg Raises: Strengthening;Right;Left;10 reps;Supine Hip  Flexion/Marching: Strengthening;Right;Left;10 reps;Supine Toe Raises: Strengthening;Both;10 reps;Standing Heel Raises: Strengthening;Both;10 reps;Standing Total Joint Exercises Bridges: Strengthening;Both;10 reps;Supine Other Exercises Other Exercises: single leg stance x 3 reps each side x 5 second hold, bilat UE support Other Exercises: partial tandem stance x 3 reps each side, 10 second hold, no UE support Other Exercises: posterior hip wall bumps standing x 10 reps with bilat UE support for ankle/hip strategy training Other Exercises: partial tandem stance, R and L foot forward during 10 reps each vertical and horizontal head turns.  See Function Navigator for Current Functional Status.   Therapy/Group: Individual Therapy  Raylene Everts [facility] 08/12/2016, 3:27 PM

## 2016-08-12 NOTE — Progress Notes (Signed)
Occupational Therapy Session Note  Patient Details  Name: Isaac Villa MRN: [id number] Date of Birth: July 11, 1924  Today's Date: 08/12/2016 OT Individual Time: 0800-0900 and 1410-1430 OT Individual Time Calculation (min): 60 min and 20 min   Short Term Goals: Week 1:  OT Short Term Goal 1 (Week 1): STGs = LTGs  Skilled Therapeutic Interventions/Progress Updates:    Session One: Pt seen for OT ADL bathing/dressing session. Pt in supine upon arrival, voicing desire to get in shower. He ambulated throughout session with close supervision using RW. He bathed seated on 3-1 BSC, lateral leans for buttock hygiene. He returned to w/c for dressing, partial assist for threading socks.  He stood at sink with intermittent steadying assist while completing grooming tasks. Demonstrated improved functional activity tolerance, able to tolerate ~5 minutes dynamic standing before requiring seated rest break.  Pt completed UE strengthening exercises using level I theraband. Pt unable to recall exercises taught in previous session. Provided pt with handout of exercises as well as verbal and demonstrational cues for proper form and technique. Pt completed x2 sets of 10 reps of exercises.  Pt encouraged to stay out of bed btwn therapy sessions, agreeable to sit in recliner. He was provided with ice pack to R knee following complaints of knee pain and swelling (RN aware and administering medications at end of session). Pt left with all needs in reach.   Session Two: Pt seen for OT session focusing on education/ recommendations for DME, functional transfers and ambulation. Pt asleep in supine upon arrival with wife present. Pt easily awoken and with encouragement agreeable to tx session. Pt's wife looking to order shower chair from [employer] from recommendation list provided by therapist. According to chair description online chair only raises to 20". Practiced pt sit <> stand from 20" surface, requiring increased assist  for controlled descent onto low surface as well as min-mod A to stand. Recommended finding chair that raises to higher seat level and offered alternative shower chair options (i.e. 3-1 BSC inside shower". Pt's wife left in room and continued online shopping for chair. Pt ambulated to therapy gym with supervision using RW. Completed x4 sit <> stands from EOM, attempting to lessen UE reliance during standing process. Pt able to stand from highly elevated mat using only one UE to push up with.  Pt requested return to supine on mat while awaiting hand off to PT.   Therapy Documentation Precautions:  Precautions Precautions: Fall Precaution Comments: h/o recent falls.  No history of falls prior to 07/24/16 Restrictions Weight Bearing Restrictions: No LLE Weight Bearing: Weight bearing as tolerated Other Position/Activity Restrictions: has R knee brace if needed ADL: ADL ADL Comments: refer to functional navigator  See Function Navigator for Current Functional Status.   Therapy/Group: Individual Therapy  [name], Mallery Harshman C 08/12/2016, 6:38 AM

## 2016-08-13 ENCOUNTER — Inpatient Hospital Stay (HOSPITAL_COMMUNITY): Payer: Medicare Other

## 2016-08-13 DIAGNOSIS — R630 Anorexia: Secondary | ICD-10-CM

## 2016-08-13 DIAGNOSIS — R638 Other symptoms and signs concerning food and fluid intake: Secondary | ICD-10-CM

## 2016-08-13 DIAGNOSIS — R0989 Other specified symptoms and signs involving the circulatory and respiratory systems: Secondary | ICD-10-CM

## 2016-08-13 DIAGNOSIS — N390 Urinary tract infection, site not specified: Secondary | ICD-10-CM

## 2016-08-13 DIAGNOSIS — I950 Idiopathic hypotension: Secondary | ICD-10-CM

## 2016-08-13 DIAGNOSIS — R319 Hematuria, unspecified: Secondary | ICD-10-CM

## 2016-08-13 LAB — URINALYSIS, ROUTINE W REFLEX MICROSCOPIC
Glucose, UA: NEGATIVE mg/dL
Ketones, ur: NEGATIVE mg/dL
Nitrite: NEGATIVE
PROTEIN: 30 mg/dL — AB
SPECIFIC GRAVITY, URINE: 1.015 (ref 1.005–1.030)
pH: 5 (ref 5.0–8.0)

## 2016-08-13 MED ORDER — NITROFURANTOIN MONOHYD MACRO 100 MG PO CAPS
100.0000 mg | ORAL_CAPSULE | Freq: Two times a day (BID) | ORAL | Status: DC
  Administered 2016-08-13 – 2016-08-15 (×5): 100 mg via ORAL
  Filled 2016-08-13 (×5): qty 1

## 2016-08-13 MED ORDER — NITROFURANTOIN MONOHYD MACRO 100 MG PO CAPS
100.0000 mg | ORAL_CAPSULE | Freq: Two times a day (BID) | ORAL | Status: DC
  Filled 2016-08-13: qty 1

## 2016-08-13 MED ORDER — SODIUM CHLORIDE 0.9 % IV SOLN
INTRAVENOUS | Status: AC
  Administered 2016-08-13: 18:00:00 via INTRAVENOUS

## 2016-08-13 NOTE — Progress Notes (Signed)
[place] PHYSICAL MEDICINE & REHABILITATION     PROGRESS NOTE    Subjective/Complaints: Pt seen laying in bed this AM.  Per nursing, pt with blood in urine.  Pt complained of chills overnight.  Caregiver at bedside.   ROS: Denies nausea, vomiting, diarrhea, shortness of breath or chest pain  Objective: Vital Signs: Blood pressure (!) 85/51, pulse 85, temperature 98 F (36.7 C), temperature source Oral, resp. rate (!) 32, height 6\' 2"  (1.88 m), weight 78.9 kg (174 lb), SpO2 96 %. No results found. No results for input(s): WBC, HGB, HCT, PLT in the last 72 hours. No results for input(s): NA, K, CL, GLUCOSE, BUN, CREATININE, CALCIUM in the last 72 hours.  Invalid input(s): CO CBG (last 3)  No results for input(s): GLUCAP in the last 72 hours.  Wt Readings from Last 3 Encounters:  08/08/16 78.9 kg (174 lb)  07/27/16 78 kg (172 lb)  06/14/16 77.1 kg (170 lb)    Physical Exam:  Head: Normocephalicand atraumatic.  Eyes: EOMI. No discharge.  Cardiovascular: RRR. No JVD. Respiratory: CTA B/l. Unlabored.  GI: Soft. Bowel sounds are normal.  Musculoskeletal: Edema: 1+ LLE. No tenderness Neurological: He is alert and oriented.  Follows commands.  Motor 5/5 bilateral upper exts.  LLE HF 4-/5, ADF/PF 4/5 (unchanged).  RLE 4/5 HF, 4/5 KE and 5/5 ADF/PF Skin: bruising on both thighs. Left incision with dressing c/d/i Psych: pleasant and cooperative.  Assessment/Plan: 1. FUnctional deficits secondary to left femoral neck fracture, let subdural hygroma which require 3+ hours per day of interdisciplinary therapy in a comprehensive inpatient rehab setting. Physiatrist is providing close team supervision and 24 hour management of active medical problems listed below. Physiatrist and rehab team continue to assess barriers to discharge/monitor patient progress toward functional and medical goals.  Function:  Bathing Bathing position   Position: Shower  Bathing parts Body parts  bathed by patient: Right arm, Left arm, Chest, Abdomen, Front perineal area, Buttocks, Right upper leg, Left upper leg, Right lower leg, Left lower leg Body parts bathed by helper: Back  Bathing assist Assist Level: Supervision or verbal cues      Upper Body Dressing/Undressing Upper body dressing   What is the patient wearing?: Pull over shirt/dress     Pull over shirt/dress - Perfomed by patient: Thread/unthread right sleeve, Thread/unthread left sleeve, Put head through opening, Pull shirt over trunk          Upper body assist Assist Level: Set up   Set up : To obtain clothing/put away  Lower Body Dressing/Undressing Lower body dressing   What is the patient wearing?: Underwear, Pants, Socks Underwear - Performed by patient: Thread/unthread right underwear leg, Thread/unthread left underwear leg, Pull underwear up/down   Pants- Performed by patient: Thread/unthread right pants leg, Thread/unthread left pants leg, Pull pants up/down Pants- Performed by helper: Pull pants up/down     Socks - Performed by patient: Don/doff right sock, Don/doff left sock   Shoes - Performed by patient: Don/doff right shoe, Don/doff left shoe            Lower body assist Assist for lower body dressing: Supervision or verbal cues      Toileting Toileting   Toileting steps completed by patient: Adjust clothing prior to toileting, Adjust clothing after toileting, Performs perineal hygiene Toileting steps completed by helper: Adjust clothing prior to toileting, Performs perineal hygiene, Adjust clothing after toileting Toileting Assistive Devices: Grab bar or rail  Toileting assist Assist level: Supervision or  verbal cues   Transfers Chair/bed transfer   Chair/bed transfer method: Stand pivot Chair/bed transfer assist level: Supervision or verbal cues Chair/bed transfer assistive device: Medical sales representative     Max distance: 175 Assist level: Supervision or verbal cues    Wheelchair   Type: Manual Max wheelchair distance: 150 Assist Level: Supervision or verbal cues  Cognition Comprehension Comprehension assist level: Follows complex conversation/direction with no assist  Expression Expression assist level: Expresses complex ideas: With extra time/assistive device  Social Interaction Social Interaction assist level: Interacts appropriately 90% of the time - Needs monitoring or encouragement for participation or interaction.  Problem Solving Problem solving assist level: Solves complex problems: With extra time  Memory Memory assist level: Recognizes or recalls 90% of the time/requires cueing < 10% of the time   Medical Problem List and Plan: 1. Decreased functional mobilitysecondary to left subdural hygroma andleft femoral neck fracture status post left hip replacement anterior approach 123XX123 complicated by findings of left frontal subdural hygroma. Weightbearing as tolerated  -Records reviewed. MRI 2/4 reviewed, showing likely hygroma with mild mass effect, will discuss with primary teams future plans for repeat imaging -continue with CIR  -team working toward Tuesday discharge. Pt is pleased with this  2. DVT Prophylaxis/Anticoagulation: SCDs. Vascular study negative 3. Pain Management: Patient currently using only Tylenol for pain due to postoperative deliriumrelated to narcotics 4. Mood/delirium: Provide emotional support as appropriate 5. Neuropsych: This patient iscapable of making decisions on hisown behalf. 6. Skin/Wound Care: Routine skin checks 7. Fluids/Electrolytes/Nutrition: . -encourage PO.  -RD consult  -megace initiated.  8.Right thigh hematoma secondary to fall. Conservative care 9.Atrial fibrillation. Cardiac rate control. Follow-up per cardiology services. No current plan to resume Coumadin therapy. Lanoxin 0.125 mg daily, Cardizem CD 240 mg daily 10.Hypertension. HCTZ 25 mg daily, lisinopril 10  mg daily.    Hypotensive 2/11, meds held  Will consider IVF 11.Acute on chronic anemia. Continue iron supplement  Hb 10.7 on 2/7, labile recordings ?accuracy  Repeat labs ordered for tomorrow 12.History of gout. Zyloprim 300 mg daily. Monitor for any signs of flareup 13.BPH. Proscar5 mg daily, Flomax 0.4 mg daily. Emptying without issues 14.Hyperlipidemia. Lovaza1 mg daily 15.Constipation. Improving 16. Insomnia: low dose trazodone prn 17. Leukocytosis  Afebrile  Labile as well, labs ordered for tomorrow 18. Hypoalbuminemia  Supplement 19. Hypophosphatemia  On supplementation  Labs ordered for tomorrow 20. Likely lower UTI  Likely cause of hematuria, will await Ucx  UA+, Ucx pending  Empiric Macrobid 2/11-2/17  LOS (Days) 5 A FACE TO FACE EVALUATION WAS PERFORMED  Ankit Lorie Phenix, MD 08/13/2016 3:34 PM

## 2016-08-13 NOTE — Progress Notes (Addendum)
Physical Therapy Note  Patient Details  Name: Isaac Villa MRN: [id number] Date of Birth: 1925/01/14 Today's Date: 08/13/2016  Pt missed 60 min tx due to hypotension, newly dx'd UTI, fatigue.  Family stated pt had had diarrhea on the toilet while voiding.  PT informed Margarita Grizzle, RN of diarrhea.  Pt resting in bed with family in attendance.  [name] 08/13/2016, 2:39 PM

## 2016-08-13 NOTE — Progress Notes (Signed)
08/13/16  1825 Nsg:  Pt's UA results abnormal and was started on Macrobid for seven days. Fluids encouraged, however, pt needs a lot of cueing. He has also had decreased appetite. He's been hypotensive throughout the shift. Orders obtained to hold Lisinopril, HCTZ and Cardizem today. Family aware and concerned about hydration status and bp.  MD notified and obtained orders to start NS 10ml/hr x 15 hours. Fluids running at ordered rate. Continued to monitor. [name]. Dorella Laster, Rn

## 2016-08-14 ENCOUNTER — Inpatient Hospital Stay (HOSPITAL_COMMUNITY): Payer: Medicare Other | Admitting: Speech Pathology

## 2016-08-14 ENCOUNTER — Inpatient Hospital Stay (HOSPITAL_COMMUNITY): Payer: Medicare Other | Admitting: Occupational Therapy

## 2016-08-14 ENCOUNTER — Inpatient Hospital Stay (HOSPITAL_COMMUNITY): Payer: Medicare Other | Admitting: Physical Therapy

## 2016-08-14 LAB — CBC WITH DIFFERENTIAL/PLATELET
BASOS ABS: 0 10*3/uL (ref 0.0–0.1)
Basophils Relative: 0 %
Eosinophils Absolute: 0 10*3/uL (ref 0.0–0.7)
Eosinophils Relative: 0 %
HCT: 32.1 % — ABNORMAL LOW (ref 39.0–52.0)
HEMOGLOBIN: 10 g/dL — AB (ref 13.0–17.0)
LYMPHS ABS: 1 10*3/uL (ref 0.7–4.0)
Lymphocytes Relative: 3 %
MCH: 28.8 pg (ref 26.0–34.0)
MCHC: 31.2 g/dL (ref 30.0–36.0)
MCV: 92.5 fL (ref 78.0–100.0)
MONO ABS: 1.3 10*3/uL — AB (ref 0.1–1.0)
MONOS PCT: 4 %
Neutro Abs: 30 10*3/uL — ABNORMAL HIGH (ref 1.7–7.7)
Neutrophils Relative %: 93 %
PLATELETS: 230 10*3/uL (ref 150–400)
RBC: 3.47 MIL/uL — AB (ref 4.22–5.81)
RDW: 17.5 % — AB (ref 11.5–15.5)
WBC: 32.3 10*3/uL — AB (ref 4.0–10.5)

## 2016-08-14 LAB — BASIC METABOLIC PANEL
ANION GAP: 14 (ref 5–15)
BUN: 47 mg/dL — AB (ref 6–20)
CO2: 25 mmol/L (ref 22–32)
Calcium: 7.8 mg/dL — ABNORMAL LOW (ref 8.9–10.3)
Chloride: 100 mmol/L — ABNORMAL LOW (ref 101–111)
Creatinine, Ser: 1.36 mg/dL — ABNORMAL HIGH (ref 0.61–1.24)
GFR calc Af Amer: 52 mL/min — ABNORMAL LOW (ref >60)
GFR, EST NON AFRICAN AMERICAN: 44 mL/min — AB (ref >60)
Glucose, Bld: 111 mg/dL — ABNORMAL HIGH (ref 65–99)
POTASSIUM: 3 mmol/L — AB (ref 3.5–5.1)
Sodium: 139 mmol/L (ref 135–145)

## 2016-08-14 LAB — PHOSPHORUS: PHOSPHORUS: 3.2 mg/dL (ref 2.5–4.6)

## 2016-08-14 NOTE — Progress Notes (Signed)
Occupational Therapy Session Note  Patient Details  Name: Isaac Villa MRN: [id number] Date of Birth: [birth year]  Today's Date: 08/14/2016 OT Individual Time: 1100-1148 OT Individual Time Calculation (min): 48 min  and Today's Date: 08/14/2016 OT Missed Time: 12 Minutes Missed Time Reason: Patient fatigue   Short Term Goals: Week 1:  OT Short Term Goal 1 (Week 1): STGs = LTGs   Skilled Therapeutic Interventions/Progress Updates:    Pt seen for OT ADL bathing/dressing session. Pt in supine upon arival, voicing having had rough evening but willing to attempt tx session.  He ambulated throughout room with supervision using RW. He required min assist for exiting shower due to decreased standing balance. HE dressed seated in w/c standing to RW to pull pants up. Encouragement required throughout session for independence as pt quick to ask wife for unneeded assistance with bathing/dressing tasks.  Pt requested return to bed following bathing/dressing, declining any further attempt at mobility/ standing tasks.  He returned to supine and while in supine discussed with pt and wife regarding d/c planning, recommendations for OOB schedule, importance of proper nutrition, shower recommendations, home accessibility, and prioritizing tasks.  Pt left in supine at end of session, all needs in reach with wife present.  Pt missed 12 minutes of session due to fatigue.    Therapy Documentation Precautions:  Precautions Precautions: Fall Precaution Comments: h/o recent falls.  No history of falls prior to 07/24/16 Restrictions Weight Bearing Restrictions: No LLE Weight Bearing: Weight bearing as tolerated Other Position/Activity Restrictions: has R knee brace if needed Pain: Pain Assessment Pain Assessment: 0-10 Pain Score: 0-No pain ADL: ADL ADL Comments: refer to functional navigator  See Function Navigator for Current Functional Status.   Therapy/Group: Individual Therapy  [name], Joshu Furukawa  C 08/14/2016, 7:07 AM

## 2016-08-14 NOTE — Progress Notes (Signed)
Physical Therapy Discharge Summary  Patient Details  Name: Isaac Villa MRN: [id number] Date of Birth: July 19, 1924  Today's Date: 08/14/2016 PT Individual Time: 0800-0925 PT Individual Time Calculation (min): 85 min    Patient has met 11 of 12 long term goals due to improved activity tolerance, improved balance, improved postural control, increased strength, increased range of motion, decreased pain, ability to compensate for deficits, functional use of  right lower extremity and left lower extremity, improved attention and improved awareness.  Patient to discharge at an ambulatory level Supervision.   Patient's care partner is independent to provide the necessary physical and cognitive assistance at discharge.  Reasons goals not met: ModI goals unmet however adequate for d/c; unmet d/t pt inconsistency with activities d/t pain and fatigue. Discussed with pt's wife recommendation that pt have 24/7 including someone standing with patient when he is up and moving. Wife reports they plan to be with him 24/7 with potential to hire an aide to help. Anticipatory awareness goal of modI unmet.   Recommendation:  Patient will benefit from ongoing skilled PT services in home health setting to continue to advance safe functional mobility, address ongoing impairments in strength, coordination, balance, activity tolerance, and minimize fall risk.  Equipment: RW  Reasons for discharge: treatment goals met and discharge from hospital  Patient/family agrees with progress made and goals achieved: Yes  PT Discharge Precautions/Restrictions Precautions Precautions: Fall Restrictions LLE Weight Bearing: Weight bearing as tolerated Pain Pain Assessment Pain Assessment: No/denies pain Vision/Perception    WFL Cognition Overall Cognitive Status: Impaired/Different from baseline Arousal/Alertness: Awake/alert Orientation Level: Oriented X4 Attention: Sustained Sustained Attention: Appears  intact Memory: Impaired Memory Impairment: Storage deficit;Retrieval deficit;Decreased recall of new information;Decreased short term memory Decreased Short Term Memory: Verbal complex;Functional complex Awareness: Appears intact Problem Solving: Impaired Problem Solving Impairment: Verbal complex;Functional complex Safety/Judgment: Appears intact Sensation Sensation Light Touch: Appears Intact Stereognosis: Appears Intact Hot/Cold: Appears Intact Proprioception: Appears Intact Motor  Motor Motor - Discharge Observations: generalized weakness  Mobility Bed Mobility Bed Mobility: Supine to Sit;Sit to Supine Supine to Sit: 6: Modified independent (Device/Increase time) Sit to Supine: 6: Modified independent (Device/Increase time) Transfers Transfers: Yes Sit to Stand: With armrests;5: Supervision Sit to Stand Details: Verbal cues for precautions/safety;Verbal cues for safe use of DME/AE Stand to Sit: 5: Supervision;With upper extremity assist Stand to Sit Details (indicate cue type and reason): Verbal cues for technique Stand to Sit Details: cues for eccentric control Stand Pivot Transfers: 5: Supervision Stand Pivot Transfer Details: Verbal cues for technique;Verbal cues for precautions/safety Locomotion  Ambulation Ambulation: Yes Ambulation/Gait Assistance: 5: Supervision Ambulation Distance (Feet): 175 Feet Assistive device: Rolling walker Ambulation/Gait Assistance Details: Verbal cues for technique;Verbal cues for precautions/safety Ambulation/Gait Assistance Details: cues for upright posture Gait Gait Pattern: Impaired Gait Pattern: Poor foot clearance - right;Poor foot clearance - left;Lateral hip instability;Decreased trunk rotation;Narrow base of support;Left flexed knee in stance;Right flexed knee in stance;Decreased stride length;Step-through pattern Stairs / Additional Locomotion Stairs: Yes Stairs Assistance: 5: Supervision Stairs Assistance Details: Verbal  cues for precautions/safety;Verbal cues for technique Stair Management Technique: Two rails;Step to pattern;Forwards Number of Stairs: 12 Height of Stairs: 3 (3" and 6" stairs) Ramp: 5: Supervision Wheelchair Mobility Wheelchair Mobility: No (pt ambulatory)  Trunk/Postural Assessment  Cervical Assessment Cervical Assessment: Within Functional Limits Thoracic Assessment Thoracic Assessment: Exceptions to [facility] (Kyphotic) Lumbar Assessment Lumbar Assessment: Exceptions to [facility] (Posterior pelvic tilt) Postural Control Postural Control: Deficits on evaluation (Due to generalized weakness)  Balance Balance Balance Assessed:  Yes Standardized Balance Assessment Standardized Balance Assessment: Berg Balance Test;Timed Up and Go Test Timed Up and Go Test TUG: Normal TUG Normal TUG (seconds): 34 Extremity Assessment  RUE Assessment RUE Assessment: Within Functional Limits LUE Assessment LUE Assessment: Within Functional Limits RLE Assessment RLE Assessment: Exceptions to [facility] (generalized weakness, limited d/t pain, grossly 4/5 to 4+/5 throughout) LLE Assessment LLE Assessment: Exceptions to [facility] (generalized weakness, grossly 4/5 to 4+/5 throughout)  Skilled Therapeutic Intervention: Pt received supine in bed, denies pain and agreeable to treatment. Vitals stable throughout session 131/78 BP HR 90 initially with trend downward toward end of session to BP 106/53 with pt asymptomatic. Assessed all mobility as described above with S and RW overall. Pt's wife present for end of session; discussed recommendation that pt have 24/7 S initially upon return home and pt did not meet modI goals. Pt's wife verbalizes understanding and states they will have 24/7 assist from family and will hire an aide if necessary. Pt and wife with no further questions regarding d/c at this time. Pt remained seated in recliner at end of session, all needs in reach.   See Function Navigator for Current Functional  Status.  Benjiman Core Tygielski 08/14/2016, 9:33 AM

## 2016-08-14 NOTE — Progress Notes (Signed)
Occupational Therapy Session Note  Patient Details  Name: Isaac Villa MRN: [id number] Date of Birth: [birth year]  Today's Date: 08/14/2016 OT Individual Time: [id number] OT Individual Time Calculation (min): 68 min  7 minutes missed due to fatigue    Skilled Therapeutic Interventions/Progress Updates: Pt was lying in bed at time of arrival with spouse Pamala Hurry present. With max encouragement, he was agreeable to session. Tx focus on d/c planning, home shower access, and UB exercise completion. After OT resized RW for home, Pt ambulated short distance with RW and Min A for sit<stand from 27" high mattress (per home environment.)   Pt adamant regarding being propelled by spouse to therapy apartment instead of ambulating. Pt provided max encouragement to ambulate and was educated on benefits, however pt still refused. Therefore he was taken to therapy apartment in w/c. OT-Barbara collaboration completed regarding simulating pts bathroom setup for mock transfer to shower bench. Pt initially refused to engage in transfer, required max encouragement to participate and eventually agreed. Pt ambulated short distance to simulated shower and demonstrated safe technique after receiving education and visual demonstration. Prolonged rest break provided to accommodate fatigue. Pt then agreed to transfer back to w/c, completed with overall Min A. Pt then propelled 100 feet in w/c, requested to be propelled for remainder of way to room. Once in room, pt completed his UB HEP with orange theraband x10 reps with mod instruction on technique. Pt then wanted to return to bed, completed with Min A and RW. He was repositioned for comfort and left with all needs within reach and spouse present.   Therapy Documentation Precautions:  Precautions Precautions: Fall Precaution Comments: h/o recent falls.  No history of falls prior to 07/24/16 Restrictions Weight Bearing Restrictions: No LLE Weight Bearing: Weight bearing as  tolerated Other Position/Activity Restrictions: has R knee brace if needed General: General OT Amount of Missed Time: 7 Minutes Vital Signs: Therapy Vitals BP: (!) 118/59 Pain: No c/o pain during session    ADL: ADL ADL Comments: refer to functional navigator Exercises: bilateral UE therapeutic exercise with orange therband: horizontal abduction, diagonals, elbow flexion, shoulder flexion, overhead pulls    See Function Navigator for Current Functional Status.   Therapy/Group: Individual Therapy  Amity Roes A Xander Jutras 08/14/2016, 4:12 PM

## 2016-08-14 NOTE — Plan of Care (Signed)
Problem: RH Memory Goal: LTG Patient will use memory compensatory aids to (SLP) LTG:  Patient will use memory compensatory aids to recall biographical/new, daily complex information with cues (SLP)  Outcome: Not Met (add Reason) Continues to need Supervision for recall

## 2016-08-14 NOTE — Discharge Instructions (Signed)
Inpatient Rehab Discharge Instructions  Isaac Villa Discharge date and time: No discharge date for patient encounter.   Activities/Precautions/ Functional Status: Activity: activity as tolerated Diet: regular diet Wound Care: keep wound clean and dry Functional status:  ___ No restrictions     ___ Walk up steps independently ___ 24/7 supervision/assistance   ___ Walk up steps with assistance ___ Intermittent supervision/assistance  ___ Bathe/dress independently ___ Walk with walker     _x__ Bathe/dress with assistance ___ Walk Independently    ___ Shower independently ___ Walk with assistance    ___ Shower with assistance ___ No alcohol     ___ Return to work/school ________    COMMUNITY REFERRALS UPON DISCHARGE:    Home Health:   PT                      Agency: [facility]      Phone:  [phone]   Medical Equipment/Items Ordered:  Rolling walker                                                      Agency/Supplier: [place] @ [phone]      Special Instructions: NO DRIVING   My questions have been answered and I understand these instructions. I will adhere to these goals and the provided educational materials after my discharge from the hospital.  Patient/Caregiver Signature _______________________________ Date __________  Clinician Signature _______________________________________ Date __________  Please bring this form and your medication list with you to all your follow-up doctor's appointments.

## 2016-08-14 NOTE — Progress Notes (Signed)
Occupational Therapy Discharge Summary  Patient Details  Name: SPIKE DESILETS MRN: [id number] Date of Birth: 01-21-1925    Patient has met 6 of 7 long term goals due to improved activity tolerance, improved balance, postural control, ability to compensate for deficits, improved awareness and improved coordination.  Patient to discharge at overall Supervision level.  Patient's care partner is independent to provide the necessary physical assistance at discharge.   Pt made good progress while on CIR. He cont to be most limited by decreased functional activity tolerance and R knee pain. He is also very self limiting, often asking for assistance from wife before attempting task. Provided extensive education regarding importance of OOB, importance of participation/ independence with ADLs, and home set-up recommendations for bathing/dressing tasks.  Pt very motivated to return home and states he believes performance with ADLs will improve once back in home environment. Pt's wife and daughters voice feeling willing and able to provide needed assist. Also educated pt's wife regarding reducing caregiver burden and importance of taking care of herself.   Reasons goals not met: Pt requires steadying assist for shower stall transfer. Have reviewed technique for shower stall transfer with pt's wife.   Recommendation:  Patient will benefit from ongoing skilled OT services in home health setting to continue to advance functional skills in the area of BADL, iADL and Reduce care partner burden.  Equipment: No equipment provided. Pt already has raised toilet seat and family privately bought shower chair with arm rests.   Reasons for discharge: treatment goals met and discharge from hospital  Patient/family agrees with progress made and goals achieved: Yes  OT Discharge Precautions/Restrictions  Precautions Precautions: Fall Precaution Comments: h/o recent falls.  No history of falls prior to  07/24/16 Restrictions Weight Bearing Restrictions: No LLE Weight Bearing: Weight bearing as tolerated Other Position/Activity Restrictions: has R knee brace if needed ADL ADL ADL Comments: refer to functional navigator Vision/Perception  Vision- History Baseline Vision/History: Wears glasses;Glaucoma Wears Glasses: Reading only Patient Visual Report: No change from baseline  Cognition Overall Cognitive Status: Impaired/Different from baseline Arousal/Alertness: Awake/alert Orientation Level: Oriented X4 Attention: Sustained Sustained Attention: Appears intact Memory: Impaired Memory Impairment: Storage deficit;Retrieval deficit;Decreased recall of new information;Decreased short term memory Decreased Short Term Memory: Verbal complex;Functional complex Awareness: Appears intact Problem Solving: Impaired Problem Solving Impairment: Verbal complex;Functional complex Safety/Judgment: Appears intact Sensation Sensation Light Touch: Appears Intact Stereognosis: Appears Intact Hot/Cold: Appears Intact Proprioception: Appears Intact Motor  Motor Motor - Discharge Observations: generalized weakness Mobility  Bed Mobility Bed Mobility: Supine to Sit;Sit to Supine Supine to Sit: 6: Modified independent (Device/Increase time) Sit to Supine: 6: Modified independent (Device/Increase time) Transfers Sit to Stand: With armrests;5: Supervision Sit to Stand Details: Verbal cues for precautions/safety;Verbal cues for safe use of DME/AE Stand to Sit: 5: Supervision;With upper extremity assist Stand to Sit Details (indicate cue type and reason): Verbal cues for technique Stand to Sit Details: cues for eccentric control  Trunk/Postural Assessment  Cervical Assessment Cervical Assessment: Within Functional Limits Thoracic Assessment Thoracic Assessment: Exceptions to [facility] (Kyphotic) Lumbar Assessment Lumbar Assessment: Exceptions to [facility] (Posterior pelvic tilt) Postural Control Postural  Control: Deficits on evaluation (Due to generalized weakness)  Balance Balance Balance Assessed: Yes Static Standing Balance Static Standing - Balance Support: During functional activity Static Standing - Level of Assistance: 5: Stand by assistance Dynamic Standing Balance Dynamic Standing - Balance Support: During functional activity;Right upper extremity supported;Left upper extremity supported Dynamic Standing - Level of Assistance: 5: Stand by assistance;6:  Modified independent (Device/Increase time) Extremity/Trunk Assessment RUE Assessment RUE Assessment: Within Functional Limits LUE Assessment LUE Assessment: Within Functional Limits   See Function Navigator for Current Functional Status.  [name], Verlie Hellenbrand C 08/14/2016, 12:40 PM

## 2016-08-14 NOTE — Discharge Summary (Signed)
Summary job [phone]

## 2016-08-14 NOTE — Plan of Care (Signed)
Problem: RH Tub/Shower Transfers Goal: LTG Patient will perform tub/shower transfers w/assist (OT) LTG: Patient will perform tub/shower transfers with assist, with/without cues using equipment (OT)  Outcome: Not Met (add Reason) Pt requires min A for transfers.

## 2016-08-14 NOTE — Progress Notes (Signed)
Speech Language Pathology Discharge Summary  Patient Details  Name: Isaac Villa MRN: [id number] Date of Birth: [birth year]  Today's Date: 08/14/2016 SLP Individual Time: 1300-1400 SLP Individual Time Calculation (min): 60 min   Skilled Therapeutic Interventions:   Skilled treatment session focused on addressing cognition goals. Patient completed sections of the Activities of Daily Living Assessment, selected by him based upon what he did prior to admission.  SLP facilitated session by providing Supervision level question cues for accuracy with money and math calculation tasks.  Patient was Mod I for recall of reading comprehension task with use of look back.  Patient was able to state that his wife will manage meds, his accountant will pay bills and all he wants to do is be able to read and meet his friends for coffee in the morning.  SLP re-emphasized home management recommendations.  Patient and wife in agreement.  Patient ready for discharge.      Patient has met 1 of 2 long term goals.  Patient to discharge at overall Supervision level.  Reasons goals not met: Continues to need Supervision level assist for recall    Clinical Impression/Discharge Summary:    Patient has made functional gains during this rehab admission and has met 1 out of 2 long term goals due to improved functional abilities.  Patient is currently an overall Supervision assist level for recall and complex cognitive tasks.  Patient and family education has been completed and patient will discharge home with 24 hour supervision. Patient has necessary level of assist upon discharge and no follow up SLP services are recommended at this time.   Care Partner:  Caregiver Able to Provide Assistance: Yes  Type of Caregiver Assistance: Cognitive  Recommendation:  Other (comment) (24/7 supervision faded to intermittent, which was baseline with complex tasks)     Equipment: none   Reasons for discharge: Discharged from  hospital;Treatment goals met   Patient/Family Agrees with Progress Made and Goals Achieved: Yes   Function:  Cognition Comprehension Comprehension assist level: Understands complex 90% of the time/cues 10% of the time  Expression   Expression assist level: Expresses complex 90% of the time/cues < 10% of the time  Social Interaction Social Interaction assist level: Interacts appropriately with others with medication or extra time (anti-anxiety, antidepressant).  Problem Solving Problem solving assist level: Solves complex 90% of the time/cues < 10% of the time  Memory Memory assist level: Recognizes or recalls 90% of the time/requires cueing < 10% of the time   Carmelia Roller., CCC-SLP [place] 08/14/2016, 4:55 PM

## 2016-08-14 NOTE — Discharge Summary (Signed)
NAMEAVANTI, Isaac Villa NO.:  [id number]  MEDICAL RECORD NO.:  [id number]  LOCATION:                                 FACILITY:  PHYSICIAN:  Meredith Staggers, M.D.DATE OF BIRTH:  [birth year]  DATE OF ADMISSION:  08/08/2016 DATE OF DISCHARGE:  08/15/2016                              DISCHARGE SUMMARY   DISCHARGE DIAGNOSES: 1. Left subdural hygroma and left femoral neck fracture, status post     hip replacement anterior approach, August 05, 2016. 2. SCDs for DVT prophylaxis. 3. Pain management. 4. Delirium, resolved. 5. Right thigh hematoma secondary to fall. 6. Atrial fibrillation. 7. Hypertension. 8. Acute-on-chronic anemia. 9. History of gout. 10.Benign prostatic hypertrophy. 11.Hyperlipidemia. 12.Constipation, resolved. 13. Hypokalemia   13.Insomnia. 14.Escherichia coli UTI 15. Decreased nutritional storage  HISTORY OF PRESENT ILLNESS:  This is an 83 year old right-handed male with history of left TKA, 10 years ago; chronic atrial fibrillation, on chronic Coumadin therapy; hypertension.  Recently admitted on July 27, 2016, to August 01, 2016, due to symptomatic anemia secondary to right thigh hematoma which was sustained after a mechanical fall, on anticoagulation with supratherapeutic INR.  He was transfused, stabilized.  He was discharged to [facility]. Prior to late January admission, he lived with his wife, independent prior to admission.  He presented on August 02, 2016, after a fall while ambulating to the bathroom.  He denied any chest pain or dizziness.  He complains of left hip pain.  Cranial CT scan showed mild diffuse cortical atrophy, no acute intracranial abnormality.  X-rays and imaging revealed left hip femoral neck fracture.  Noted hemoglobin of 7.7, he was transfused.  Received cardiac clearance, echocardiogram with ejection fraction of 55%.  No wall motion abnormalities and underwent anterior  approach left total hip arthroplasty on August 05, 2016, per Dr. Alvan Dame.  Weightbearing as tolerated.  Initially chronic Coumadin for atrial fibrillation was resumed.  The patient developed acute delirium, postoperatively.  MRI of the brain, August 06, 2016, showed new subdural collection around the left cerebral convexity measuring up to 10 mm in thickness, signal consistent with hygroma.  Left cerebral mass effect was mild.  No postoperative infarct.  Neurosurgery, Dr. Ellene Route, consulted and advised conservative care.  He was cleared to begin aspirin therapy, but no Coumadin.  Close monitoring of hemoglobin. Physical and occupational therapy ongoing.  The patient was admitted for a comprehensive rehab program.  PAST MEDICAL HISTORY:  See discharge diagnoses.  SOCIAL HISTORY:  He lives with his wife independent, up until early January.  Functional status upon admission to rehab services was minimal assist 65 feet with rolling walker, minimal assist stand and pivot transfers, min-to-mod assist with activities of daily living.  PHYSICAL EXAMINATION:  VITAL SIGNS:  Blood pressure 130/66, pulse 73, temperature 98, respirations 16. GENERAL:  This was an alert male. HEENT:  Normocephalic.  EOMs intact.  Pupils round and reactive to light. CARDIAC:  Rate controlled. ABDOMEN:  Soft and nontender.  Good bowel sounds. LUNGS:  Clear to auscultation without wheeze.  The patient oriented to person, place, and month.  REHABILITATION AND HOSPITAL COURSE:  The patient was admitted to [facility] with  therapies initiated on a 3-hour daily basis, consisting of physical therapy, occupational therapy, speech therapy, and rehabilitation nursing.  The following issues were addressed during the patient's rehabilitation stay.  Pertaining to Mr. Oo, left femoral neck fracture, he had undergone anterior approach for left total hip replacement per Dr. Alvan Dame.  Weightbearing as tolerated.   Neurovascular sensation intact.  He still had some extensive bruising, but no bleeding episodes.  Left subdural hygroma monitored conservatively by Neurosurgery, Dr. Ellene Route.  The patient did have a history of atrial fibrillation with chronic Coumadin.  This was held due to extensive thigh hematoma as well as left subdural hygroma.  He was cleared to begin low-dose aspirin therapy.  Pain management with the use of Tylenol.  SCDs for DVT prophylaxis.  Blood pressures overall remained soft and his lisinopril was held. Mild hypokalemia 3.0 with supplement added.  He would follow up with his primary MD.  Hemoglobin and hematocrit remained stable.  He continued on Proscar as well as Flomax for history of BPH.  Bouts of constipation resolved with laxative assistance.  He did have some noted urgency of bladder.  A urinalysis was completed showing negative nitrite, moderate leukocytes with culture greater than 100,000 Escherichia coli.  He was placed on  Macrobid with sensitivities pending and monitored.    Noted early on hospital course, the patient with some bouts of delirium, which continued to improve throughout his course.  He was able to participate fully with his therapies.  He could express complex ideas, interacts appropriately with staff.  He solves complex problems. Memory, recognition, and recall at 90%.  The patient received weekly collaborative interdisciplinary team conferences to discuss estimated length of stay, family teaching, any barriers to his discharge.  He required supervision with supine to sit.  Ambulating 150 feet supervision, minimal assistance.  Toilet transfers supervision.  He could gather his belongings for activities of daily living and homemaking.  Working with energy conservation techniques.  Full family teaching was completed and plan discharge to home.  DISCHARGE MEDICATIONS: 1. Allopurinol 300 mg p.o. daily. 2. Aspirin 81 mg p.o. daily. 3. Lanoxin 0.125 mg p.o.  daily. 4. Cardizem CD 240 mg p.o. daily. 5. Colace 100 mg p.o. b.i.d. 6. Ferrous sulfate 325 mg p.o. t.i.d. 7. Proscar 5 mg p.o. at bedtime. 8. Hydrochlorothiazide 25 mg p.o. daily. 9. Xalatan ophthalmic solution 0.005% 1 drop both eyes at bedtime. 10.Lisinopril 10 mg p.o. Daily.HOLD UNTIL FURTHER NOTICE 11.Magnesium oxide 400 mg p.o. daily. 12.Lovaza 1 g p.o. daily. 13.Phosphorus 500 mg p.o. t.i.d. 14.Flomax 0.4 mg p.o. daily. 15.Trazodone 25 mg p.o. at bedtime. 16.Tylenol as needed. 17. Macrobid 100 mg every 12 hours through 08/19/2016 and stop 18. K Dur 20 mg daily  DIET:  His diet was regular.  ACTIVITIES:  The patient weightbearing as tolerated.  FOLLOWUP:  He would follow up with Dr. Alger Simons, at the outpatient rehab service office as needed; Dr. Kristeen Miss call for appointment; Dr. Paralee Cancel, call for appointment, August 18, 2016; Dr. Dorthy Cooler, medical management.  SPECIAL INSTRUCTIONS:  No Coumadin therapy.  No driving.     Lauraine Rinne, P.A.   ______________________________ Meredith Staggers, M.D.    DA/MEDQ  D:  08/14/2016  T:  08/14/2016  Job:  [id number]  cc:   [name], MD Earleen Newport, M.D. Pietro Cassis Alvan Dame, M.D. Meredith Staggers, M.D.

## 2016-08-14 NOTE — Progress Notes (Signed)
[facility]IC[facility] & REHABILITATION     PROGRESS NOTE    Subjective/Complaints: Feeling better today. Admits to bp being low yesterday. "not drinking enough". Denies headache. Hip pain better. Still anxious to go home.   ROS: pt denies nausea, vomiting, diarrhea, cough, shortness of breath or chest pain  Objective: Vital Signs: Blood pressure (!) 96/48, pulse 66, temperature 97.8 F (36.6 C), temperature source Oral, resp. rate 18, height 6\' 2"  (1.88 m), weight 78.9 kg (174 lb), SpO2 95 %. No results found. No results for input(s): WBC, HGB, HCT, PLT in the last 72 hours. No results for input(s): NA, K, CL, GLUCOSE, BUN, CREATININE, CALCIUM in the last 72 hours.  Invalid input(s): CO CBG (last 3)  No results for input(s): GLUCAP in the last 72 hours.  Wt Readings from Last 3 Encounters:  08/08/16 78.9 kg (174 lb)  07/27/16 78 kg (172 lb)  06/14/16 77.1 kg (170 lb)    Physical Exam:  Head: Normocephalicand atraumatic.  Eyes: EOMI. No discharge.  Cardiovascular: RRR. Respiratory: clear no wheezes.  GI: Soft. Bowel sounds are normal.  Musculoskeletal: Edema: 1+ LLE. No tenderness Neurological: He is alert and oriented.  Follows commands.  Motor 5/5 bilateral upper exts.  LLE HF 4-/5, ADF/PF 4/5 (stable.  RLE 4/5 HF, 4/5 KE and 5/5 ADF/PF Skin: bruising on both thighs improving. Left incision clean/dry/ Psych: pleasant and cooperative.  Assessment/Plan: 1. FUnctional deficits secondary to left femoral neck fracture, let subdural hygroma which require 3+ hours per day of interdisciplinary therapy in a comprehensive inpatient rehab setting. Physiatrist is providing close team supervision and 24 hour management of active medical problems listed below. Physiatrist and rehab team continue to assess barriers to discharge/monitor patient progress toward functional and medical goals.  Function:  Bathing Bathing position   Position: Shower  Bathing parts Body  parts bathed by patient: Right arm, Left arm, Chest, Abdomen, Front perineal area, Buttocks, Right upper leg, Left upper leg, Right lower leg, Left lower leg Body parts bathed by helper: Back  Bathing assist Assist Level: Supervision or verbal cues      Upper Body Dressing/Undressing Upper body dressing   What is the patient wearing?: Pull over shirt/dress     Pull over shirt/dress - Perfomed by patient: Thread/unthread right sleeve, Thread/unthread left sleeve, Put head through opening, Pull shirt over trunk          Upper body assist Assist Level: Set up   Set up : To obtain clothing/put away  Lower Body Dressing/Undressing Lower body dressing   What is the patient wearing?: Underwear, Pants, Socks Underwear - Performed by patient: Thread/unthread right underwear leg, Thread/unthread left underwear leg, Pull underwear up/down   Pants- Performed by patient: Thread/unthread right pants leg, Thread/unthread left pants leg, Pull pants up/down Pants- Performed by helper: Pull pants up/down     Socks - Performed by patient: Don/doff right sock, Don/doff left sock   Shoes - Performed by patient: Don/doff right shoe, Don/doff left shoe            Lower body assist Assist for lower body dressing: Supervision or verbal cues      Toileting Toileting   Toileting steps completed by patient: Adjust clothing prior to toileting, Adjust clothing after toileting, Performs perineal hygiene Toileting steps completed by helper: Adjust clothing prior to toileting, Performs perineal hygiene, Adjust clothing after toileting Toileting Assistive Devices: Grab bar or rail  Toileting assist Assist level: Supervision or verbal cues   Transfers  Chair/bed transfer Chair/bed transfer activity did not occur: Safety/medical concerns Chair/bed transfer method: Stand pivot Chair/bed transfer assist level: Supervision or verbal cues Chair/bed transfer assistive device: Armrests, Environmental health practitioner     Max distance: 175 Assist level: Supervision or verbal cues   Wheelchair   Type: Manual Max wheelchair distance: 150 Assist Level: Supervision or verbal cues  Cognition Comprehension Comprehension assist level: Understands basic 75 - 89% of the time/ requires cueing 10 - 24% of the time  Expression Expression assist level: Expresses basic needs/ideas: With extra time/assistive device  Social Interaction Social Interaction assist level: Interacts appropriately 90% of the time - Needs monitoring or encouragement for participation or interaction.  Problem Solving Problem solving assist level: Solves basic 90% of the time/requires cueing < 10% of the time  Memory Memory assist level: Recognizes or recalls 90% of the time/requires cueing < 10% of the time   Medical Problem List and Plan: 1. Decreased functional mobilitysecondary to left subdural hygroma andleft femoral neck fracture status post left hip replacement anterior approach 123XX123 complicated by findings of left frontal subdural hygroma. Weightbearing as tolerated  -follow up imaging of hygromas per NS -continue with CIR  -can still plan on Tuesday discharge depending upon how today goes. Spoke with patient and family at length.  2. DVT Prophylaxis/Anticoagulation: SCDs. Vascular study negative 3. Pain Management: Patient currently using only Tylenol for pain due to postoperative deliriumrelated to narcotics 4. Mood/delirium: Provide emotional support as appropriate 5. Neuropsych: This patient iscapable of making decisions on hisown behalf. 6. Skin/Wound Care: Routine skin checks 7. Fluids/Electrolytes/Nutrition: . -encourage PO.  -RD consult  -megace initiated.   -labs pending 8.Right thigh hematoma secondary to fall. Conservative care 9.Atrial fibrillation. Cardiac rate control. Follow-up per cardiology services. No current plan to resume Coumadin therapy. Lanoxin  0.125 mg daily, Cardizem CD 240 mg daily 10.Hypertension. HCTZ 25 mg daily, lisinopril 10 mg daily.    Hypotensive 2/11, meds held  (bp's were not high to begin with)  IVF 75 hr x 15.  11.Acute on chronic anemia. Continue iron supplement  Hb 10.7 on 2/7, labile recordings ?accuracy  Repeat labs pending 12.History of gout. Zyloprim 300 mg daily. Monitor for any signs of flareup 13.BPH. Proscar5 mg daily, Flomax 0.4 mg daily. Emptying without issues 14.Hyperlipidemia. Lovaza1 mg daily 15.Constipation. Improving 16. Insomnia: low dose trazodone prn 17. Leukocytosis  Afebrile  Labs pending 18. Hypoalbuminemia  Supplement 19. Hypophosphatemia  On supplementation  Labs ordered pending 20. GNR UTI  Likely cause of hematuria, will await Ucx  Continue empiric Macrobid 2/11-2/17 pending culture  LOS (Days) 6 A FACE TO FACE EVALUATION WAS PERFORMED  Meredith Staggers, MD 08/14/2016 9:37 AM

## 2016-08-15 DIAGNOSIS — B9620 Unspecified Escherichia coli [E. coli] as the cause of diseases classified elsewhere: Secondary | ICD-10-CM

## 2016-08-15 LAB — CBC WITH DIFFERENTIAL/PLATELET
BASOS ABS: 0 10*3/uL (ref 0.0–0.1)
BASOS PCT: 0 %
EOS ABS: 0.2 10*3/uL (ref 0.0–0.7)
Eosinophils Relative: 1 %
HCT: 29.7 % — ABNORMAL LOW (ref 39.0–52.0)
HEMOGLOBIN: 9.3 g/dL — AB (ref 13.0–17.0)
Lymphocytes Relative: 4 %
Lymphs Abs: 0.7 10*3/uL (ref 0.7–4.0)
MCH: 29 pg (ref 26.0–34.0)
MCHC: 31.3 g/dL (ref 30.0–36.0)
MCV: 92.5 fL (ref 78.0–100.0)
Monocytes Absolute: 0.7 10*3/uL (ref 0.1–1.0)
Monocytes Relative: 4 %
NEUTROS PCT: 91 %
Neutro Abs: 15.4 10*3/uL — ABNORMAL HIGH (ref 1.7–7.7)
Platelets: 192 10*3/uL (ref 150–400)
RBC: 3.21 MIL/uL — AB (ref 4.22–5.81)
RDW: 17.2 % — ABNORMAL HIGH (ref 11.5–15.5)
WBC: 17 10*3/uL — AB (ref 4.0–10.5)

## 2016-08-15 LAB — URINE CULTURE: Culture: >=100000 — AB

## 2016-08-15 MED ORDER — NITROFURANTOIN MONOHYD MACRO 100 MG PO CAPS: 100.0000 mg | ORAL_CAPSULE | Freq: Two times a day (BID) | ORAL | 0 refills | Status: AC

## 2016-08-15 MED ORDER — ALLOPURINOL 300 MG PO TABS: 300.0000 mg | ORAL_TABLET | Freq: Every day | ORAL | 1 refills | Status: AC

## 2016-08-15 MED ORDER — TAMSULOSIN HCL 0.4 MG PO CAPS: 0.4000 mg | ORAL_CAPSULE | Freq: Every day | ORAL | 0 refills | Status: AC

## 2016-08-15 MED ORDER — POTASSIUM CHLORIDE CRYS ER 20 MEQ PO TBCR: 20.0000 meq | EXTENDED_RELEASE_TABLET | Freq: Every day | ORAL | 0 refills | Status: AC

## 2016-08-15 MED ORDER — HYDROCHLOROTHIAZIDE 25 MG PO TABS: 25.0000 mg | ORAL_TABLET | Freq: Every day | ORAL | 1 refills | Status: AC

## 2016-08-15 MED ORDER — K PHOS MONO-SOD PHOS DI & MONO 155-852-130 MG PO TABS: 500.0000 mg | ORAL_TABLET | Freq: Three times a day (TID) | ORAL | 0 refills | Status: AC

## 2016-08-15 MED ORDER — LISINOPRIL 10 MG PO TABS
10.0000 mg | ORAL_TABLET | Freq: Every day | ORAL | 2 refills | Status: DC
Start: 2016-08-15 — End: 2016-08-15

## 2016-08-15 MED ORDER — DIGOXIN 125 MCG PO TABS: 0.1250 mg | ORAL_TABLET | Freq: Every day | ORAL | 1 refills | Status: AC

## 2016-08-15 MED ORDER — TRAZODONE HCL 50 MG PO TABS: 25.0000 mg | ORAL_TABLET | Freq: Every day | ORAL | 0 refills | Status: AC

## 2016-08-15 MED ORDER — OMEGA-3-ACID ETHYL ESTERS 1 G PO CAPS: 1.0000 g | ORAL_CAPSULE | Freq: Every day | ORAL | 0 refills | Status: AC

## 2016-08-15 MED ORDER — FERROUS SULFATE 325 (65 FE) MG PO TABS: 325.0000 mg | ORAL_TABLET | Freq: Three times a day (TID) | ORAL | 0 refills | Status: AC

## 2016-08-15 MED ORDER — MAGNESIUM OXIDE 400 (241.3 MG) MG PO TABS: 400.0000 mg | ORAL_TABLET | Freq: Every day | ORAL | 0 refills | Status: AC

## 2016-08-15 MED ORDER — FINASTERIDE 5 MG PO TABS: 5.0000 mg | ORAL_TABLET | Freq: Every day | ORAL | 1 refills | Status: AC

## 2016-08-15 MED ORDER — POTASSIUM CHLORIDE CRYS ER 20 MEQ PO TBCR
20.0000 meq | EXTENDED_RELEASE_TABLET | Freq: Two times a day (BID) | ORAL | Status: DC
  Administered 2016-08-15: 20 meq via ORAL
  Filled 2016-08-15: qty 1

## 2016-08-15 MED ORDER — DILTIAZEM HCL ER COATED BEADS 240 MG PO CP24: 240.0000 mg | ORAL_CAPSULE | Freq: Every day | ORAL | 1 refills | Status: AC

## 2016-08-15 NOTE — Progress Notes (Signed)
[facility]ICAL MEDICINE & REHABILITATION     PROGRESS NOTE    Subjective/Complaints: Pt sitting in chair. Anxious to go home! "i feel better"  ROS: pt denies nausea, vomiting, diarrhea, cough, shortness of breath or chest pain  Objective: Vital Signs: Blood pressure (!) 111/51, pulse 74, temperature 97.8 F (36.6 C), temperature source Oral, resp. rate 18, height 6\' 2"  (1.88 m), weight 78.9 kg (174 lb), SpO2 94 %. No results found.  Recent Labs  08/14/16 1457 08/15/16 0814  WBC 32.3* 17.0*  HGB 10.0* 9.3*  HCT 32.1* 29.7*  PLT 230 192    Recent Labs  08/14/16 1457  NA 139  K 3.0*  CL 100*  GLUCOSE 111*  BUN 47*  CREATININE 1.36*  CALCIUM 7.8*   CBG (last 3)  No results for input(s): GLUCAP in the last 72 hours.  Wt Readings from Last 3 Encounters:  08/08/16 78.9 kg (174 lb)  07/27/16 78 kg (172 lb)  06/14/16 77.1 kg (170 lb)    Physical Exam:  Head: Normocephalicand atraumatic.  Eyes: PERRL  Cardiovascular:RRR Respiratory: CTA B  GI: Soft. Bowel sounds are normal.  Musculoskeletal: Edema: tr 1+ LLE. No tenderness Neurological: He is alert and oriented.  Follows commands.  Motor 5/5 bilateral upper exts.  LLE HF 4-/5, ADF/PF 4/5 (stable.  RLE 4/5 HF, 4/5 KE and 5/5 ADF/PF Skin: bruising on both thighs improving. Left hip incision clean/dry Psych: pleasant and cooperative.  Assessment/Plan: 1. FUnctional deficits secondary to left femoral neck fracture, let subdural hygroma which require 3+ hours per day of interdisciplinary therapy in a comprehensive inpatient rehab setting. Physiatrist is providing close team supervision and 24 hour management of active medical problems listed below. Physiatrist and rehab team continue to assess barriers to discharge/monitor patient progress toward functional and medical goals.  Function:  Bathing Bathing position   Position: Shower  Bathing parts Body parts bathed by patient: Right arm, Left arm, Chest,  Abdomen, Front perineal area, Buttocks, Right upper leg, Left upper leg, Right lower leg, Left lower leg, Back Body parts bathed by helper: Back  Bathing assist Assist Level: Supervision or verbal cues      Upper Body Dressing/Undressing Upper body dressing   What is the patient wearing?: Pull over shirt/dress     Pull over shirt/dress - Perfomed by patient: Thread/unthread right sleeve, Thread/unthread left sleeve, Put head through opening, Pull shirt over trunk          Upper body assist Assist Level: Set up   Set up : To obtain clothing/put away  Lower Body Dressing/Undressing Lower body dressing   What is the patient wearing?: Underwear, Pants, Socks, Shoes Underwear - Performed by patient: Thread/unthread right underwear leg, Thread/unthread left underwear leg, Pull underwear up/down   Pants- Performed by patient: Thread/unthread right pants leg, Thread/unthread left pants leg, Pull pants up/down, Fasten/unfasten pants Pants- Performed by helper: Pull pants up/down     Socks - Performed by patient: Don/doff right sock, Don/doff left sock   Shoes - Performed by patient: Don/doff right shoe, Don/doff left shoe            Lower body assist Assist for lower body dressing: Supervision or verbal cues      Toileting Toileting   Toileting steps completed by patient: Adjust clothing prior to toileting, Adjust clothing after toileting, Performs perineal hygiene Toileting steps completed by helper: Adjust clothing prior to toileting, Performs perineal hygiene, Adjust clothing after toileting Toileting Assistive Devices: Grab bar or rail  Toileting assist Assist level: Supervision or verbal cues   Transfers Chair/bed transfer Chair/bed transfer activity did not occur: Safety/medical concerns Chair/bed transfer method: Stand pivot Chair/bed transfer assist level: Supervision or verbal cues Chair/bed transfer assistive device: Armrests, Medical sales representative      Max distance: 175 Assist level: Supervision or verbal cues   Wheelchair   Type: Manual Max wheelchair distance: 150 Assist Level: Supervision or verbal cues  Cognition Comprehension Comprehension assist level: Understands complex 90% of the time/cues 10% of the time  Expression Expression assist level: Expresses complex 90% of the time/cues < 10% of the time  Social Interaction Social Interaction assist level: Interacts appropriately with others with medication or extra time (anti-anxiety, antidepressant).  Problem Solving Problem solving assist level: Solves complex 90% of the time/cues < 10% of the time  Memory Memory assist level: Recognizes or recalls 90% of the time/requires cueing < 10% of the time   Medical Problem List and Plan: 1. Decreased functional mobilitysecondary to left subdural hygroma andleft femoral neck fracture status post left hip replacement anterior approach 123XX123 complicated by findings of left frontal subdural hygroma. Weightbearing as tolerated  -follow up imaging of hygromas per NS as outpt -dc home today  -follow up with PCP/ortho 2. DVT Prophylaxis/Anticoagulation: SCDs. Vascular study negative 3. Pain Management: Patient currently using only Tylenol for pain due to postoperative deliriumrelated to narcotics 4. Mood/delirium: Provide emotional support as appropriate 5. Neuropsych: This patient iscapable of making decisions on hisown behalf. 6. Skin/Wound Care: Routine skin checks 7. Fluids/Electrolytes/Nutrition: . -encourage PO.  -RD consult  -dc megace.   -replace potassium 8.Right thigh hematoma secondary to fall. Conservative care 9.Atrial fibrillation. Cardiac rate control. Follow-up per cardiology services. No current plan to resume Coumadin therapy. Lanoxin 0.125 mg daily, Cardizem CD 240 mg daily 10.Hypertension. HCTZ 25 mg daily, lisinopril 10 mg daily.    Hypotensive 2/11--continue to hold meds---resume  as outpt per primary     11.Acute on chronic anemia. Continue iron supplement  Hb 10.7 on 2/7, labile recordings      12.History of gout. Zyloprim 300 mg daily. Monitor for any signs of flareup 13.BPH. Proscar5 mg daily, Flomax 0.4 mg daily. Emptying without issues 14.Hyperlipidemia. Lovaza1 mg daily 15.Constipation. Improving 16. Insomnia: low dose trazodone prn 17. Leukocytosis  Afebrile  Labs pending 18. Hypoalbuminemia  Supplement 19. Hypophosphatemia  On supplementation  Labs ordered pending 20. E Coli UTI: sens to macrodantin  -afebrile  -wbc's down to 17k today  -continue macrodantin for 7 day course  LOS (Days) 7 A FACE TO FACE EVALUATION WAS PERFORMED  Meredith Staggers, MD 08/15/2016 9:21 AM

## 2016-08-15 NOTE — Patient Care Conference (Signed)
Inpatient RehabilitationTeam Conference and Plan of Care Update Date: 08/15/2016   Time: 2:15 PM    Patient Name: Isaac Villa      Medical Record Number: [id number]  Date of Birth: 1924-07-19 Sex: Male         Room/Bed: 4W24C/4W24C-01 Payor Info: Payor: MEDICARE / Plan: MEDICARE PART A AND B / Product Type: *No Product type* /    Admitting Diagnosis: L  FN  Admit Date/Time:  08/08/2016  4:10 PM Admission Comments: No comment available   Primary Diagnosis:  <principal problem not specified> Principal Problem: <principal problem not specified>  Patient Active Problem List   Diagnosis Date Noted  . Acute lower UTI   . Labile blood pressure   . Idiopathic hypotension   . Hematuria   . Poor appetite   . Poor fluid intake   . Hypophosphatemia   . Hypoalbuminemia due to protein-calorie malnutrition ([place])   . Leukocytosis   . Acute blood loss anemia   . Anemia of chronic disease   . PAF (paroxysmal atrial fibrillation) ([place])   . Hygroma   . Benign essential HTN   . Left displaced femoral neck fracture ([place]) 08/08/2016  . Protein-calorie malnutrition, severe 08/03/2016  . Hip fracture ([place]) 08/02/2016  . BPH (benign prostatic hyperplasia) 08/02/2016  . Protein calorie malnutrition ([place]) 08/02/2016  . Closed hip fracture, left, initial encounter ([place])   . Physical deconditioning   . Traumatic hematoma of buttock 08/01/2016  . Rhabdomyolysis 08/01/2016  . Symptomatic anemia 07/27/2016  . Generalized weakness 07/27/2016  . Hyperkalemia 07/27/2016  . Acute kidney injury ([place]) 07/27/2016  . Elevated INR 07/27/2016  . Hematoma 07/27/2016  . Atrial fibrillation ([place]) [I48.91] 02/08/2016  . Long term (current) use of anticoagulants [Z79.01] 02/08/2016  . Edema extremities 06/04/2014  . Chronic anticoagulation 06/04/2013  . Thrombocytopenia ([place])   . Chronic atrial fibrillation ([place]) 05/30/2013  . Essential hypertension, benign 05/30/2013    Expected Discharge Date: Expected  Discharge Date: 08/15/16  Team Members Present: Physician leading conference: Dr. Alger Simons Social Worker Present: Lennart Pall, LCSW Nurse Present: Rayetta Pigg, RN PT Present: Kem Parkinson, PT OT Present: Napoleon Form, OT PPS Coordinator present : Daiva Nakayama, RN, CRRN     Current Status/Progress Goal Weekly Team Focus  Medical   left hip fx, tha. subdural hygromas. course complicated by UTI and hypotension  stabilize medically for discharge  uti, wound care, bp   Bowel/Bladder   B/B continence. LBM 08/14/16. Refused evening colace. Hematuria present at one point in time otherwise clear.  Continue to be continent of B/B.  Assess B/B function. Offer toileting Q2H and PRN.   Swallow/Nutrition/ Hydration             ADL's   Supervision overall; min A functional transfers when fatigued  Supervision overall  d/c planning; family ed; pt set for d/c home today   Mobility   modI bed mobility, S transfers, gait and stairs  modI bed mobility, transfers, gait in home, S stairs and community mobility  pt/family education on home safety and recommendation for 24/7 S due to inconsistent performance/safety of mobility activities   Communication             Safety/Cognition/ Behavioral Observations            Pain   Denies any post op pain. Tylenol PRN.  Maintain comfortable pain level of <3-10.  Assess pain q shift and prn. If treated, recheck for effectiveness.   Skin  Bruising to bilateral hip and back. Left surgical incision to hip with aquacel dressing.  No new skin breakdown and site be free of infection.  Assess skin q shift and prn.     Rehab Goals Patient on target to meet rehab goals: Yes *See Care Plan and progress notes for long and short-term goals.  Barriers to Discharge: safety awareness, medical issues    Possible Resolutions to Barriers:  infection sensitive to abx, labs and symptoms improving. holding bp meds.     Discharge Planning/Teaching Needs:  Home  with wife and private duty care who can provide 24/7 supervision/ assist.      Team Discussion:  Pt has made good gains and reaching supervision goals overall.  Ready for d/c today with HH follow up.  Revisions to Treatment Plan:  None   Continued Need for Acute Rehabilitation Level of Care: The patient requires daily medical management by a physician with specialized training in physical medicine and rehabilitation for the following conditions: Daily direction of a multidisciplinary physical rehabilitation program to ensure safe treatment while eliciting the highest outcome that is of practical value to the patient.: Yes Daily medical management of patient stability for increased activity during participation in an intensive rehabilitation regime.: Yes Daily analysis of laboratory values and/or radiology reports with any subsequent need for medication adjustment of medical intervention for : Post surgical problems;Urological problems;Blood pressure problems  Rebekah Sprinkle 08/15/2016, 5:04 PM

## 2016-08-15 NOTE — Progress Notes (Signed)
Social Work  Discharge Note  The overall goal for the admission was met for:   Discharge location: Yes - home with wife and private duty covering 24/7 supervision  Length of Stay: Yes - 7 days  Discharge activity level: Yes - supervision  Home/community participation: Yes  Services provided included: MD, RD, PT, OT, RN, Pharmacy and [place]: Medicare and Private Insurance: [place]  Follow-up services arranged: Home Health: PT via [facility], DME: rolling walker via [place] and Patient/Family has no preference for HH/DME agencies  Comments (or additional information):  Patient/Family verbalized understanding of follow-up arrangements: Yes  Individual responsible for coordination of the follow-up plan: pt  Confirmed correct DME delivered: Zsofia Prout 08/15/2016    Brittainy Bucker

## 2016-08-16 ENCOUNTER — Ambulatory Visit: Payer: Medicare Other | Admitting: Podiatry

## 2016-08-23 NOTE — Progress Notes (Signed)
1400:  Pt discharged to home. Pt and wife verbalize D/C instructions given by PAC.  Pt continues to deny pain. In good spirits.VSS. B/P 126/72, HR 72

## 2016-08-30 ENCOUNTER — Ambulatory Visit: Payer: Medicare Other | Admitting: Cardiology

## 2016-08-31 DIAGNOSIS — 419620001 Death: Secondary | SNOMED CT | POA: Diagnosis not present

## 2016-08-31 NOTE — Progress Notes (Signed)
Late Entry for 08/15/16....39 Called to room by pts wife; NT found pt on BR floor, sitting position.  Pt and wife stsaes pt "slid to floor."   Silvestre Mesi [facility] aware, saw pt.  Mild redness lt upper back area.  Denies any pain or discomfort, MAE's. Denies lt hip pain. Assisted to bed. VSS, see FS.  1400:  Pt discharged to home. Pt and wife verbalize D/C instructions given by PAC.  Pt continues to deny pain. In good spirits.VSS. B/P 126/72, HR 72

## 2016-08-31 DEATH — deceased

## 2016-10-04 ENCOUNTER — Other Ambulatory Visit: Payer: Self-pay | Admitting: Cardiology

## 2018-06-08 IMAGING — CT CT PELVIS W/O CM
2 of 3 series · 14 of 46 positions shown, 16 images · non-contrast
Comparison: None.

CLINICAL DATA: Slipped on ice at [place], with acute onset of
right hip pain. Initial encounter.

EXAM:
CT PELVIS WITHOUT CONTRAST
TECHNIQUE: Multidetector CT imaging of the pelvis was performed following the
standard protocol without intravenous contrast.

[Series 3: pelvis st · axial · 0.89mm/px · z∈[+922,+1176]mm · 11 of 99 slices shown, 13 images]
[im 7/99  soft-tissue]
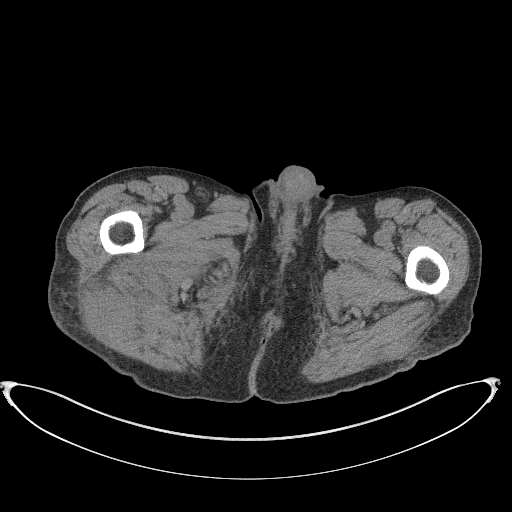
[im 7/99  bone]
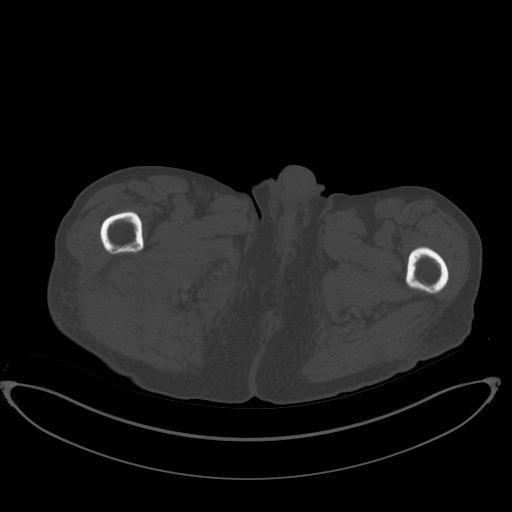
[im 16/99  soft-tissue]
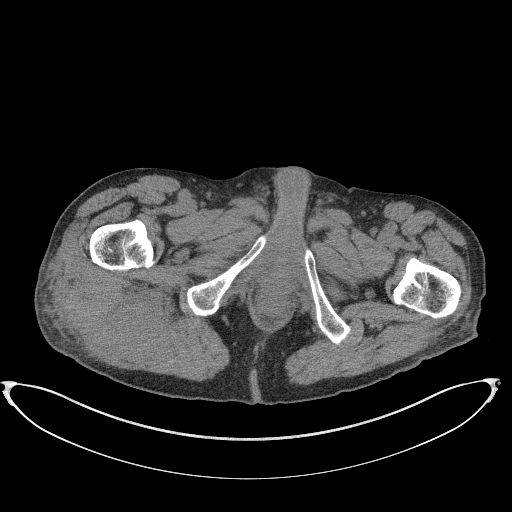
[im 23/99  soft-tissue]
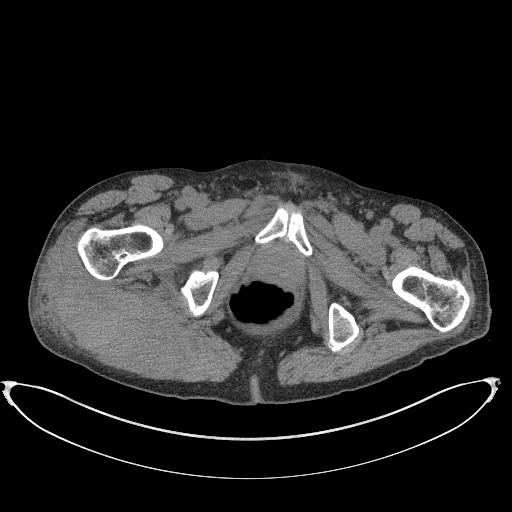
[im 32/99  soft-tissue]
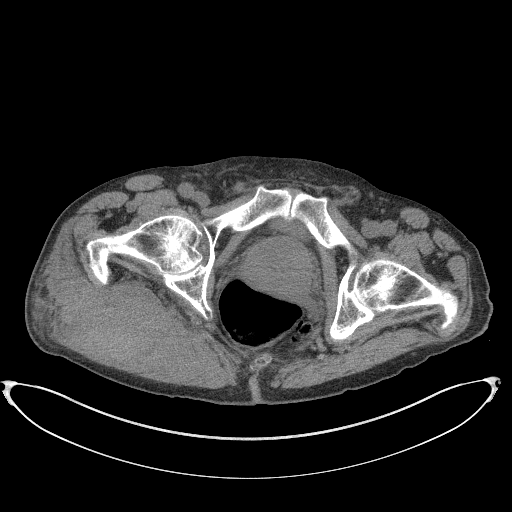
[im 42/99  soft-tissue]
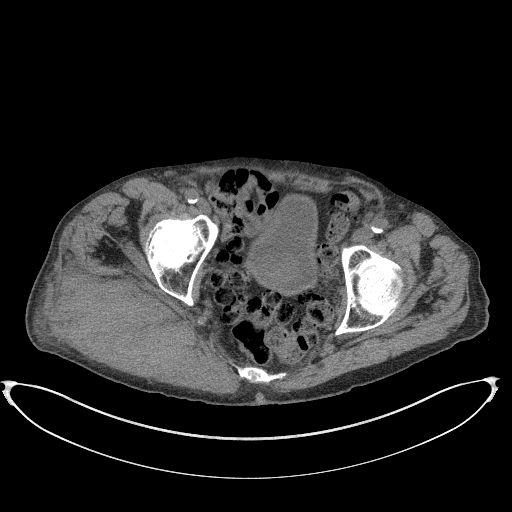
[im 51/99  soft-tissue]
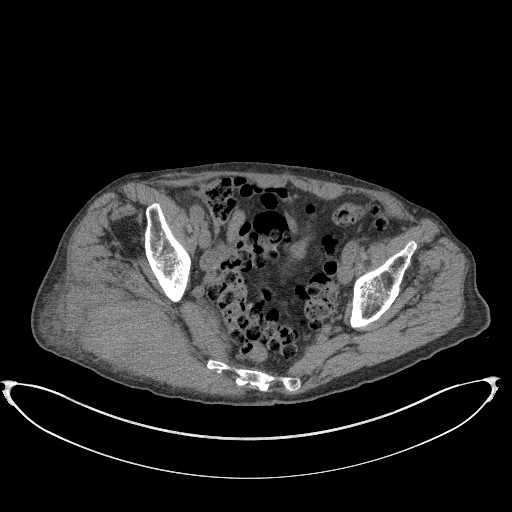
[im 57/99  soft-tissue]
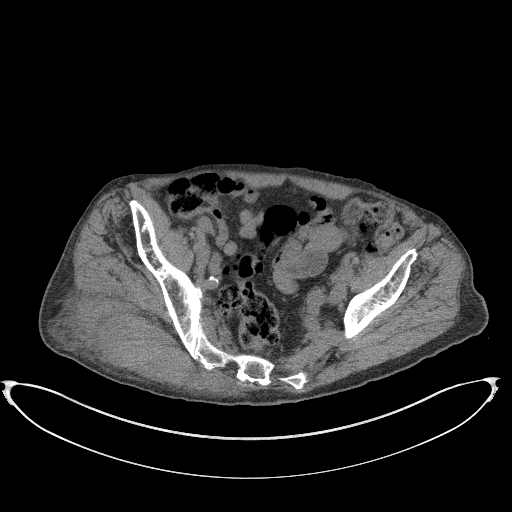
[im 67/99  soft-tissue]
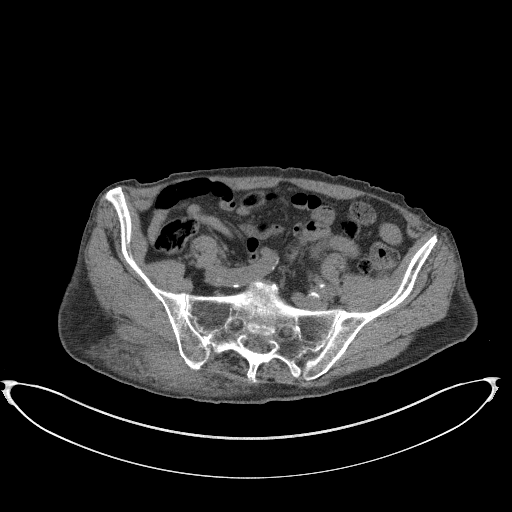
[im 76/99  soft-tissue]
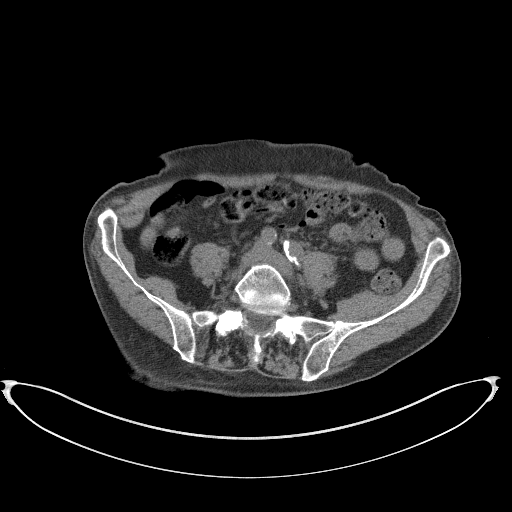
[im 76/99  bone]
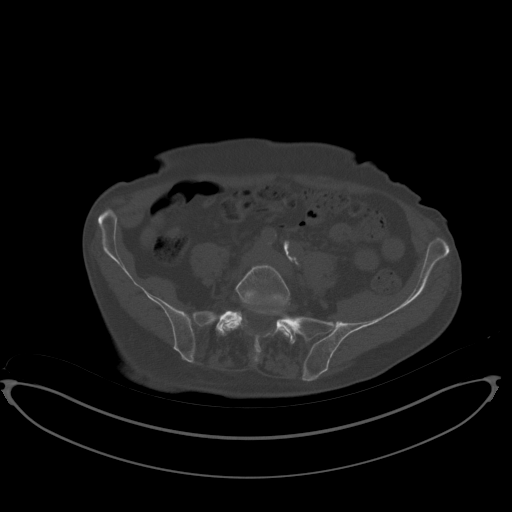
[im 83/99  soft-tissue]
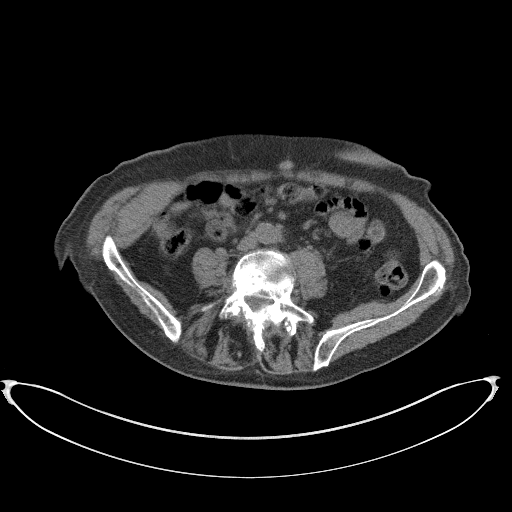
[im 92/99  soft-tissue]
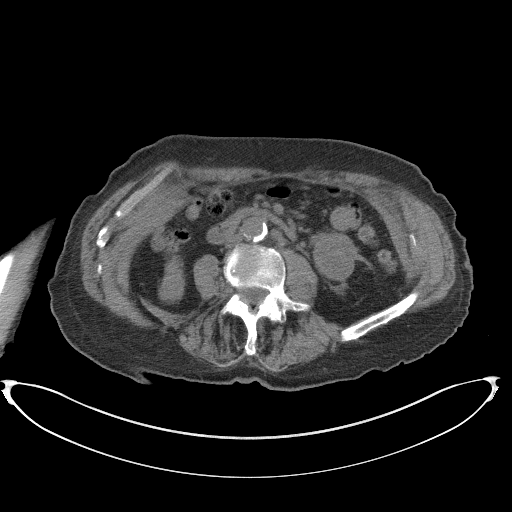

[Series 4: coronal images · coronal · 0.58mm/px · 3 of 128 slices shown]
[im 43/128  soft-tissue]
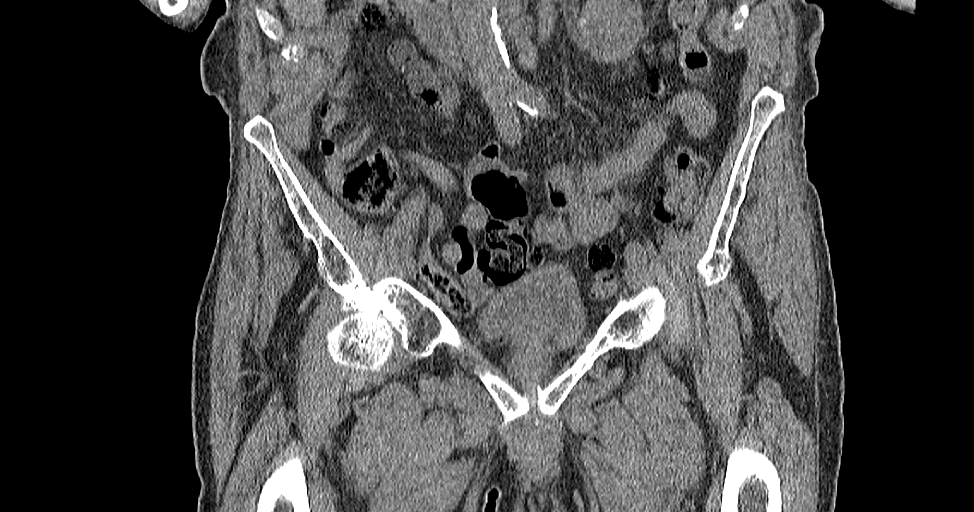
[im 57/128  soft-tissue]
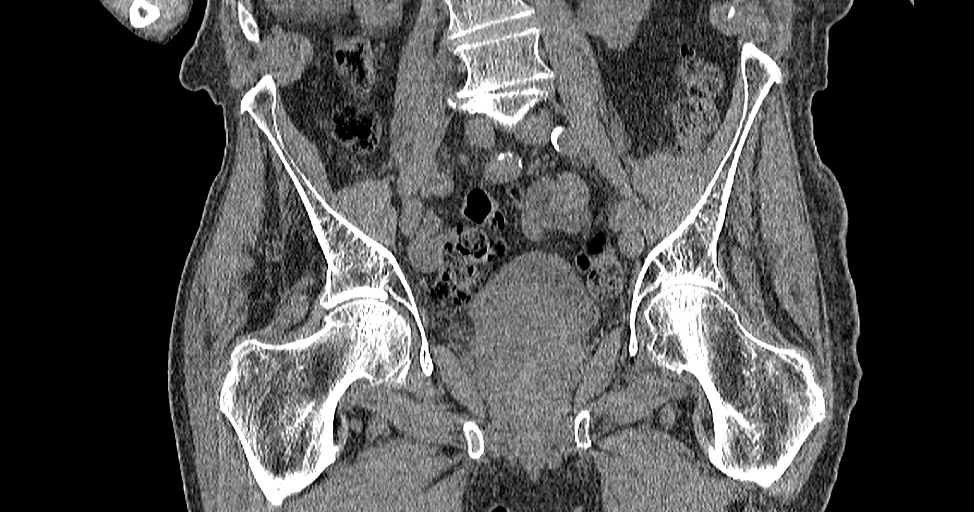
[im 71/128  soft-tissue]
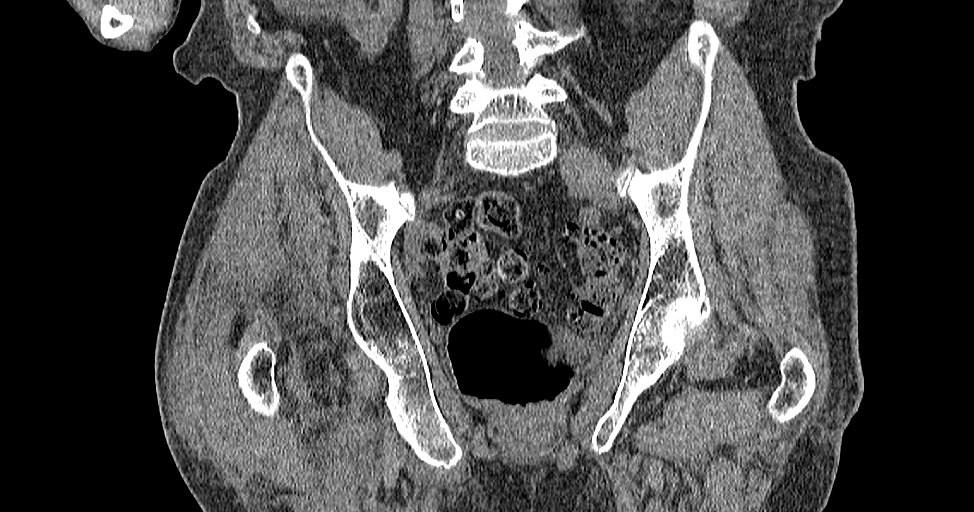

[14 of 46 positions shown; findings below may reference images not displayed]

FINDINGS: Urinary Tract: The bladder is mildly distended and grossly
unremarkable in appearance, with impression on the base of bladder
from the patient's significantly enlarged prostate.

Bilateral renal cysts are seen, including a somewhat complex 4.9 cm
left renal cyst with a slightly thickened wall and peripheral
calcification. As this has very gradually grown from 1.8 cm in 0555,
renal ultrasound or CT with contrast would be helpful for further
evaluation, as deemed clinically appropriate.

Bowel: Scattered diverticulosis is noted along the visualized
portions of the colon, without evidence of diverticulitis.

Vascular/Lymphatic: Scattered calcification is seen along the distal
abdominal aorta and its branches. No inguinal or pelvic sidewall
lymphadenopathy is seen.

Enlarged retroperitoneal nodes measure up to 1.3 cm in short axis,
raising question for underlying malignancy.

Reproductive: The prostate is significantly enlarged, measuring
cm in transverse dimension, with minimal calcification.

Other: There is a large soft tissue hematoma within the right
gluteus musculature, with overlying bruising. The hematoma measures
nearly 14 x 10 x 6 cm in size.

Musculoskeletal: There is no definite evidence of fracture. Mild
degenerative change is noted at the lower lumbar spine, with
underlying chronic right-sided pars defect at L4, and mild grade 1
anterolisthesis of L4 on L5. The remainder of the visualized
musculature is grossly unremarkable in appearance.
IMPRESSION: 1. No definite evidence of fracture at this time.
2. Large soft tissue hematoma at the right gluteus musculature, with
overlying soft tissue bruising. The hematoma measures nearly 14 x 10
x 6 cm in size.
3. Enlarged retroperitoneal nodes, measuring up to 1.3 cm in short
axis, raising question for underlying malignancy. Further evaluation
could be considered as deemed clinically appropriate.
4. Significantly enlarged prostate.  Would correlate with PSA.
5. Somewhat complex 4.9 cm left renal cystic lesion demonstrates
slightly thickened wall and peripheral calcification, and has very
gradually grown from 1.8 cm in 0555. Renal ultrasound or CT with
contrast would be helpful for further evaluation, if deemed
clinically appropriate.
6. Chronic right-sided pars defect at L4, with mild grade 1
anterolisthesis of L4 on L5.
